# Patient Record
Sex: Male | Born: 1935 | Race: White | Hispanic: No | Marital: Married | Smoking: Never smoker
Health system: Southern US, Community
[De-identification: ages and names within clinical notes are randomized; demographics above are authoritative.]

## PROBLEM LIST (undated history)

## (undated) DIAGNOSIS — Z87442 Personal history of urinary calculi: Secondary | ICD-10-CM

## (undated) DIAGNOSIS — E119 Type 2 diabetes mellitus without complications: Secondary | ICD-10-CM

## (undated) DIAGNOSIS — I499 Cardiac arrhythmia, unspecified: Secondary | ICD-10-CM

## (undated) DIAGNOSIS — I10 Essential (primary) hypertension: Secondary | ICD-10-CM

## (undated) DIAGNOSIS — I5032 Chronic diastolic (congestive) heart failure: Secondary | ICD-10-CM

## (undated) DIAGNOSIS — I509 Heart failure, unspecified: Secondary | ICD-10-CM

## (undated) DIAGNOSIS — I482 Chronic atrial fibrillation, unspecified: Secondary | ICD-10-CM

## (undated) DIAGNOSIS — R0600 Dyspnea, unspecified: Secondary | ICD-10-CM

## (undated) DIAGNOSIS — E785 Hyperlipidemia, unspecified: Secondary | ICD-10-CM

## (undated) DIAGNOSIS — M109 Gout, unspecified: Secondary | ICD-10-CM

## (undated) HISTORY — PX: CATARACT EXTRACTION: SUR2

## (undated) HISTORY — PX: OTHER SURGICAL HISTORY: SHX169

## (undated) HISTORY — DX: Heart failure, unspecified: I50.9

## (undated) HISTORY — PX: KNEE SURGERY: SHX244

## (undated) HISTORY — PX: REPLACEMENT TOTAL KNEE BILATERAL: SUR1225

---

## 2021-07-20 ENCOUNTER — Emergency Department: Payer: Medicare Other

## 2021-07-20 ENCOUNTER — Other Ambulatory Visit: Payer: Self-pay

## 2021-07-20 ENCOUNTER — Inpatient Hospital Stay
Admission: EM | Admit: 2021-07-20 | Discharge: 2021-07-24 | DRG: 291 | Disposition: A | Discharge status: Home / Self Care | Payer: Medicare Other | Attending: Internal Medicine | Admitting: Internal Medicine

## 2021-07-20 ENCOUNTER — Encounter: Payer: Self-pay | Admitting: Internal Medicine

## 2021-07-20 DIAGNOSIS — I10 Essential (primary) hypertension: Secondary | ICD-10-CM

## 2021-07-20 DIAGNOSIS — I482 Chronic atrial fibrillation, unspecified: Secondary | ICD-10-CM | POA: Diagnosis not present

## 2021-07-20 DIAGNOSIS — M109 Gout, unspecified: Secondary | ICD-10-CM | POA: Insufficient documentation

## 2021-07-20 DIAGNOSIS — I110 Hypertensive heart disease with heart failure: Principal | ICD-10-CM | POA: Diagnosis present

## 2021-07-20 DIAGNOSIS — Z7984 Long term (current) use of oral hypoglycemic drugs: Secondary | ICD-10-CM | POA: Diagnosis not present

## 2021-07-20 DIAGNOSIS — I4821 Permanent atrial fibrillation: Secondary | ICD-10-CM | POA: Diagnosis present

## 2021-07-20 DIAGNOSIS — I5033 Acute on chronic diastolic (congestive) heart failure: Secondary | ICD-10-CM | POA: Diagnosis present

## 2021-07-20 DIAGNOSIS — I5031 Acute diastolic (congestive) heart failure: Secondary | ICD-10-CM | POA: Diagnosis not present

## 2021-07-20 DIAGNOSIS — Z79899 Other long term (current) drug therapy: Secondary | ICD-10-CM | POA: Diagnosis not present

## 2021-07-20 DIAGNOSIS — R7989 Other specified abnormal findings of blood chemistry: Secondary | ICD-10-CM | POA: Diagnosis present

## 2021-07-20 DIAGNOSIS — J189 Pneumonia, unspecified organism: Secondary | ICD-10-CM

## 2021-07-20 DIAGNOSIS — R0602 Shortness of breath: Secondary | ICD-10-CM | POA: Diagnosis present

## 2021-07-20 DIAGNOSIS — I350 Nonrheumatic aortic (valve) stenosis: Secondary | ICD-10-CM | POA: Diagnosis present

## 2021-07-20 DIAGNOSIS — Z794 Long term (current) use of insulin: Secondary | ICD-10-CM

## 2021-07-20 DIAGNOSIS — Z66 Do not resuscitate: Secondary | ICD-10-CM | POA: Diagnosis present

## 2021-07-20 DIAGNOSIS — M7989 Other specified soft tissue disorders: Secondary | ICD-10-CM | POA: Diagnosis present

## 2021-07-20 DIAGNOSIS — D649 Anemia, unspecified: Secondary | ICD-10-CM | POA: Diagnosis present

## 2021-07-20 DIAGNOSIS — I959 Hypotension, unspecified: Secondary | ICD-10-CM | POA: Diagnosis not present

## 2021-07-20 DIAGNOSIS — Z87891 Personal history of nicotine dependence: Secondary | ICD-10-CM | POA: Diagnosis not present

## 2021-07-20 DIAGNOSIS — R2689 Other abnormalities of gait and mobility: Secondary | ICD-10-CM | POA: Diagnosis present

## 2021-07-20 DIAGNOSIS — E8809 Other disorders of plasma-protein metabolism, not elsewhere classified: Secondary | ICD-10-CM | POA: Diagnosis present

## 2021-07-20 DIAGNOSIS — E785 Hyperlipidemia, unspecified: Secondary | ICD-10-CM | POA: Insufficient documentation

## 2021-07-20 DIAGNOSIS — Z7901 Long term (current) use of anticoagulants: Secondary | ICD-10-CM | POA: Diagnosis not present

## 2021-07-20 DIAGNOSIS — Z20822 Contact with and (suspected) exposure to covid-19: Secondary | ICD-10-CM | POA: Diagnosis present

## 2021-07-20 DIAGNOSIS — Z6832 Body mass index (BMI) 32.0-32.9, adult: Secondary | ICD-10-CM | POA: Diagnosis not present

## 2021-07-20 DIAGNOSIS — E872 Acidosis: Secondary | ICD-10-CM | POA: Diagnosis present

## 2021-07-20 DIAGNOSIS — N390 Urinary tract infection, site not specified: Secondary | ICD-10-CM | POA: Diagnosis present

## 2021-07-20 DIAGNOSIS — I060 Rheumatic aortic stenosis: Secondary | ICD-10-CM | POA: Diagnosis not present

## 2021-07-20 DIAGNOSIS — E669 Obesity, unspecified: Secondary | ICD-10-CM | POA: Diagnosis present

## 2021-07-20 DIAGNOSIS — E119 Type 2 diabetes mellitus without complications: Secondary | ICD-10-CM | POA: Diagnosis present

## 2021-07-20 DIAGNOSIS — E876 Hypokalemia: Secondary | ICD-10-CM | POA: Diagnosis present

## 2021-07-20 HISTORY — DX: Chronic atrial fibrillation, unspecified: I48.20

## 2021-07-20 HISTORY — DX: Gout, unspecified: M10.9

## 2021-07-20 HISTORY — DX: Hyperlipidemia, unspecified: E78.5

## 2021-07-20 HISTORY — DX: Type 2 diabetes mellitus without complications: E11.9

## 2021-07-20 HISTORY — DX: Essential (primary) hypertension: I10

## 2021-07-20 LAB — CBC WITH DIFFERENTIAL/PLATELET
Abs Immature Granulocytes: 0.03 10*3/uL (ref 0.00–0.07)
Basophils Absolute: 0 10*3/uL (ref 0.0–0.1)
Basophils Relative: 0 %
Eosinophils Absolute: 0.3 10*3/uL (ref 0.0–0.5)
Eosinophils Relative: 4 %
HCT: 34.4 % — ABNORMAL LOW (ref 39.0–52.0)
Hemoglobin: 11.3 g/dL — ABNORMAL LOW (ref 13.0–17.0)
Immature Granulocytes: 0 %
Lymphocytes Relative: 19 %
Lymphs Abs: 1.6 10*3/uL (ref 0.7–4.0)
MCH: 31 pg (ref 26.0–34.0)
MCHC: 32.8 g/dL (ref 30.0–36.0)
MCV: 94.2 fL (ref 80.0–100.0)
Monocytes Absolute: 0.6 10*3/uL (ref 0.1–1.0)
Monocytes Relative: 7 %
Neutro Abs: 5.8 10*3/uL (ref 1.7–7.7)
Neutrophils Relative %: 70 %
Platelets: 262 10*3/uL (ref 150–400)
RBC: 3.65 MIL/uL — ABNORMAL LOW (ref 4.22–5.81)
RDW: 15.2 % (ref 11.5–15.5)
WBC: 8.3 10*3/uL (ref 4.0–10.5)
nRBC: 0 % (ref 0.0–0.2)

## 2021-07-20 LAB — LACTIC ACID, PLASMA
Lactic Acid, Venous: 3.1 mmol/L (ref 0.5–1.9)
Lactic Acid, Venous: 1.6 mmol/L (ref 0.5–1.9)

## 2021-07-20 LAB — URINALYSIS, COMPLETE (UACMP) WITH MICROSCOPIC
Bilirubin Urine: NEGATIVE
Glucose, UA: NEGATIVE mg/dL
Hgb urine dipstick: NEGATIVE
Ketones, ur: NEGATIVE mg/dL
Nitrite: POSITIVE — AB
Protein, ur: 30 mg/dL — AB
Specific Gravity, Urine: 1.018 (ref 1.005–1.030)
WBC, UA: >50 WBC/hpf — ABNORMAL HIGH (ref 0–5)
pH: 6 (ref 5.0–8.0)

## 2021-07-20 LAB — COMPREHENSIVE METABOLIC PANEL
ALT: 7 U/L (ref 0–44)
AST: 17 U/L (ref 15–41)
Albumin: 3.4 g/dL — ABNORMAL LOW (ref 3.5–5.0)
Alkaline Phosphatase: 58 U/L (ref 38–126)
Anion gap: 12 (ref 5–15)
BUN: 16 mg/dL (ref 8–23)
CO2: 31 mmol/L (ref 22–32)
Calcium: 8.6 mg/dL — ABNORMAL LOW (ref 8.9–10.3)
Chloride: 96 mmol/L — ABNORMAL LOW (ref 98–111)
Creatinine, Ser: 0.97 mg/dL (ref 0.61–1.24)
GFR, Estimated: >60 mL/min (ref >60)
Glucose, Bld: 129 mg/dL — ABNORMAL HIGH (ref 70–99)
Potassium: 4 mmol/L (ref 3.5–5.1)
Sodium: 139 mmol/L (ref 135–145)
Total Bilirubin: 0.8 mg/dL (ref 0.3–1.2)
Total Protein: 6.8 g/dL (ref 6.5–8.1)

## 2021-07-20 LAB — BRAIN NATRIURETIC PEPTIDE: B Natriuretic Peptide: 202.9 pg/mL — ABNORMAL HIGH (ref 0.0–100.0)

## 2021-07-20 LAB — HEMOGLOBIN A1C
Hgb A1c MFr Bld: 6.1 % — ABNORMAL HIGH (ref 4.8–5.6)
Mean Plasma Glucose: 128 mg/dL

## 2021-07-20 LAB — TROPONIN I (HIGH SENSITIVITY)
Troponin I (High Sensitivity): 15 ng/L (ref <18)
Troponin I (High Sensitivity): 13 ng/L (ref <18)
Troponin I (High Sensitivity): 13 ng/L (ref <18)

## 2021-07-20 LAB — RESP PANEL BY RT-PCR (FLU A&B, COVID) ARPGX2
Influenza A by PCR: NEGATIVE
Influenza B by PCR: NEGATIVE
SARS Coronavirus 2 by RT PCR: NEGATIVE

## 2021-07-20 LAB — PROCALCITONIN: Procalcitonin: <0.1 ng/mL

## 2021-07-20 LAB — CBG MONITORING, ED
Glucose-Capillary: 106 mg/dL — ABNORMAL HIGH (ref 70–99)
Glucose-Capillary: 124 mg/dL — ABNORMAL HIGH (ref 70–99)

## 2021-07-20 LAB — STREP PNEUMONIAE URINARY ANTIGEN: Strep Pneumo Urinary Antigen: NEGATIVE

## 2021-07-20 MED ORDER — CELECOXIB 200 MG PO CAPS
200.0000 mg | ORAL_CAPSULE | Freq: Every day | ORAL | Status: DC
  Administered 2021-07-21 – 2021-07-24 (×4): 200 mg via ORAL
  Filled 2021-07-20 (×4): qty 1

## 2021-07-20 MED ORDER — ALBUTEROL SULFATE (2.5 MG/3ML) 0.083% IN NEBU
2.5000 mg | INHALATION_SOLUTION | RESPIRATORY_TRACT | Status: DC | PRN
  Administered 2021-07-20: 2.5 mg via RESPIRATORY_TRACT
  Filled 2021-07-20: qty 3

## 2021-07-20 MED ORDER — METOPROLOL SUCCINATE ER 50 MG PO TB24
50.0000 mg | ORAL_TABLET | Freq: Every day | ORAL | Status: DC
  Administered 2021-07-21: 50 mg via ORAL
  Filled 2021-07-20: qty 1

## 2021-07-20 MED ORDER — ALLOPURINOL 300 MG PO TABS
300.0000 mg | ORAL_TABLET | Freq: Every day | ORAL | Status: DC
  Administered 2021-07-21 – 2021-07-24 (×4): 300 mg via ORAL
  Filled 2021-07-20 (×4): qty 1

## 2021-07-20 MED ORDER — ALBUTEROL SULFATE HFA 108 (90 BASE) MCG/ACT IN AERS
2.0000 | INHALATION_SPRAY | RESPIRATORY_TRACT | Status: DC | PRN
Start: 2021-07-20 — End: 2021-07-20

## 2021-07-20 MED ORDER — SODIUM CHLORIDE 0.9 % IV SOLN
2.0000 g | INTRAVENOUS | Status: DC
  Administered 2021-07-20 – 2021-07-21 (×2): 2 g via INTRAVENOUS
  Filled 2021-07-20 (×2): qty 20

## 2021-07-20 MED ORDER — INSULIN ASPART 100 UNIT/ML IJ SOLN
0.0000 [IU] | Freq: Every day | INTRAMUSCULAR | Status: DC
Start: 2021-07-20 — End: 2021-07-24

## 2021-07-20 MED ORDER — SODIUM CHLORIDE 0.9 % IV SOLN
500.0000 mg | INTRAVENOUS | Status: DC
  Administered 2021-07-20: 500 mg via INTRAVENOUS
  Filled 2021-07-20: qty 500

## 2021-07-20 MED ORDER — INSULIN ASPART 100 UNIT/ML IJ SOLN
0.0000 [IU] | Freq: Three times a day (TID) | INTRAMUSCULAR | Status: DC
  Administered 2021-07-21: 1 [IU] via SUBCUTANEOUS
  Administered 2021-07-21: 2 [IU] via SUBCUTANEOUS
  Administered 2021-07-22: 5 [IU] via SUBCUTANEOUS
  Administered 2021-07-22: 2 [IU] via SUBCUTANEOUS
  Administered 2021-07-23 – 2021-07-24 (×3): 1 [IU] via SUBCUTANEOUS
  Filled 2021-07-20 (×7): qty 1

## 2021-07-20 MED ORDER — FUROSEMIDE 10 MG/ML IJ SOLN
20.0000 mg | Freq: Two times a day (BID) | INTRAMUSCULAR | Status: DC
Start: 2021-07-20 — End: 2021-07-23
  Administered 2021-07-20 – 2021-07-23 (×6): 20 mg via INTRAVENOUS
  Filled 2021-07-20: qty 4
  Filled 2021-07-20 (×4): qty 2
  Filled 2021-07-20: qty 4

## 2021-07-20 MED ORDER — RIVAROXABAN 20 MG PO TABS
20.0000 mg | ORAL_TABLET | Freq: Every day | ORAL | Status: DC
  Administered 2021-07-21 – 2021-07-24 (×4): 20 mg via ORAL
  Filled 2021-07-20 (×5): qty 1

## 2021-07-20 MED ORDER — SIMVASTATIN 20 MG PO TABS
20.0000 mg | ORAL_TABLET | Freq: Every day | ORAL | Status: DC
  Administered 2021-07-20 – 2021-07-23 (×4): 20 mg via ORAL
  Filled 2021-07-20: qty 2
  Filled 2021-07-20 (×3): qty 1

## 2021-07-20 MED ORDER — HYDRALAZINE HCL 20 MG/ML IJ SOLN: 5.0000 mg | INTRAMUSCULAR | Status: DC | PRN

## 2021-07-20 MED ORDER — TERAZOSIN HCL 5 MG PO CAPS
10.0000 mg | ORAL_CAPSULE | Freq: Every day | ORAL | Status: DC
  Administered 2021-07-20 – 2021-07-21 (×2): 10 mg via ORAL
  Filled 2021-07-20 (×4): qty 2

## 2021-07-20 MED ORDER — LISINOPRIL 20 MG PO TABS
20.0000 mg | ORAL_TABLET | Freq: Every day | ORAL | Status: DC
  Administered 2021-07-21: 20 mg via ORAL
  Filled 2021-07-20: qty 2

## 2021-07-20 MED ORDER — INSULIN GLARGINE-YFGN 100 UNIT/ML ~~LOC~~ SOLN
10.0000 [IU] | Freq: Two times a day (BID) | SUBCUTANEOUS | Status: DC
  Administered 2021-07-20 – 2021-07-24 (×7): 10 [IU] via SUBCUTANEOUS
  Filled 2021-07-20 (×9): qty 0.1

## 2021-07-20 MED ORDER — ONDANSETRON HCL 4 MG/2ML IJ SOLN: 4.0000 mg | Freq: Three times a day (TID) | INTRAMUSCULAR | Status: DC | PRN

## 2021-07-20 MED ORDER — ACETAMINOPHEN 160 MG/5ML PO SOLN
650.0000 mg | Freq: Four times a day (QID) | ORAL | Status: DC | PRN
  Filled 2021-07-20: qty 20.3

## 2021-07-20 MED ORDER — DM-GUAIFENESIN ER 30-600 MG PO TB12: 1.0000 | ORAL_TABLET | Freq: Two times a day (BID) | ORAL | Status: DC | PRN

## 2021-07-20 NOTE — Progress Notes (Signed)
CODE SEPSIS - PHARMACY COMMUNICATION  **Broad Spectrum Antibiotics should be administered within 1 hour of Sepsis diagnosis**  Time Code Sepsis Called/Page Received: 1222  Antibiotics Ordered:  Azithromycin '500mg'$  IVPB Ceftriaxone 2g IVPB  Time of 1st antibiotic administration: 1258   Johnnye Sandford Rodriguez-Guzman PharmD 07/20/2021 1:31 PM

## 2021-07-20 NOTE — ED Provider Notes (Signed)
[facility]  ____________________________________________   Event Date/Time   First MD Initiated Contact with Patient 07/20/21 1022     (approximate)  I have reviewed the triage vital signs and the nursing notes.   HISTORY  Chief Complaint Shortness of Breath    HPI William Sawyer is a 87 y.o. male atrial fibrillation on Xarelto, hypertension, hyperlipidemia, diabetes with shortness of breath.  Patient notes that over the past several days he has been having dyspnea especially at night.  Today he had an episode that lasted about 45 minutes.  He also was having dyspnea on exertion.  He has chronic lower extremity swelling which is unchanged from prior.  He is able to sleep flat.  Denies cough, chest pain, fever or chills.  No abdominal pain, nausea vomiting or diarrhea.         No past medical history on file.  There are no problems to display for this patient.    Prior to Admission medications   Medication Sig Start Date End Date Taking? Authorizing Provider  allopurinol (ZYLOPRIM) 300 MG tablet Take 300 mg by mouth daily. 05/15/21  Yes [provider]  celecoxib (CELEBREX) 200 MG capsule Take 200 mg by mouth daily. 05/15/21  Yes [provider]  furosemide (LASIX) 20 MG tablet Take 20 mg by mouth at bedtime. 05/15/21  Yes [provider]  LANTUS SOLOSTAR 100 UNIT/ML Solostar Pen Inject 15 Units into the skin in the morning and at bedtime. 07/06/21  Yes [provider]  lisinopril (ZESTRIL) 20 MG tablet Take 20 mg by mouth daily. 06/05/21  Yes [provider]  metFORMIN (GLUCOPHAGE) 1000 MG tablet Take 1,000 mg by mouth 2 (two) times daily. 05/16/21  Yes [provider]  metoprolol succinate (TOPROL-XL) 50 MG 24 hr tablet Take 50 mg by mouth daily. 05/15/21  Yes [provider]  simvastatin (ZOCOR) 20 MG tablet Take 20 mg by mouth at bedtime. 06/04/21  Yes [provider]  terazosin  (HYTRIN) 10 MG capsule Take 10 mg by mouth at bedtime. 06/15/21  Yes [provider]  XARELTO 20 MG TABS tablet Take 20 mg by mouth daily. 06/04/21  Yes [provider]    Allergies Patient has no allergy information on record.  No family history on file.  Social History    Review of Systems  Constitutional: No fever/chills Eyes: No visual changes. ENT: No sore throat. Cardiovascular: Denies chest pain. Respiratory: + Shortness of breath Gastrointestinal: No abdominal pain.  No nausea, no vomiting.  No diarrhea.  No constipation. Genitourinary: Negative for dysuria. Musculoskeletal: Negative for back pain. Skin: Negative for rash. Neurological: Negative for headaches, focal weakness or numbness.   ____________________________________________   PHYSICAL EXAM:  VITAL SIGNS: ED Triage Vitals  Enc Vitals Group     BP 07/20/21 0936 102/62     Pulse Rate 07/20/21 0936 97     Resp 07/20/21 0936 18     Temp 07/20/21 0936 97.7 F (36.5 C)     Temp Source 07/20/21 0936 Oral     SpO2 07/20/21 0936 98 %     Weight 07/20/21 0945 260 lb (117.9 kg)     Height 07/20/21 0945 '6\' 1"'$  (1.854 m)     Head Circumference --      Peak Flow --      Pain Score 07/20/21 0945 4     Pain Loc --      Pain Edu? --  Excl. in [place]? --     Constitutional: Alert and oriented. Well appearing and in no acute distress. Eyes: Conjunctivae are normal.  Head: Atraumatic. Nose: No congestion/rhinnorhea. Mouth/Throat: Mucous membranes are moist.  Oropharynx non-erythematous. Neck: No stridor.   Cardiovascular: Regular rhythm, mildly tachycardic.  Grossly normal heart sounds.  Good peripheral circulation. Respiratory: Normal respiratory effort.  No retractions. Lungs CTAB. Gastrointestinal: Soft and nontender. No distention.  No CVA tenderness. Genitourinary: deferred Musculoskeletal: 2+ lower extremity edema, symmetric Neurologic:  Normal speech and language. No gross focal  neurologic deficits are appreciated. No gait instability. Skin:  Skin is warm, dry and intact. No rash noted. Psychiatric: Mood and affect are normal. Speech and behavior are normal.  ____________________________________________   LABS (all labs ordered are listed, but only abnormal results are displayed)  Labs Reviewed  CBC WITH DIFFERENTIAL/PLATELET - Abnormal; Notable for the following components:      Result Value   RBC 3.65 (*)    Hemoglobin 11.3 (*)    HCT 34.4 (*)    All other components within normal limits  BRAIN NATRIURETIC PEPTIDE - Abnormal; Notable for the following components:   B Natriuretic Peptide 202.9 (*)    All other components within normal limits  COMPREHENSIVE METABOLIC PANEL - Abnormal; Notable for the following components:   Chloride 96 (*)    Glucose, Bld 129 (*)    Calcium 8.6 (*)    Albumin 3.4 (*)    All other components within normal limits  LACTIC ACID, PLASMA - Abnormal; Notable for the following components:   Lactic Acid, Venous 3.1 (*)    All other components within normal limits  RESP PANEL BY RT-PCR (FLU A&B, COVID) [id number]  CULTURE, BLOOD (ROUTINE X 2)  CULTURE, BLOOD (ROUTINE X 2)  LACTIC ACID, PLASMA  TROPONIN I (HIGH SENSITIVITY)   ____________________________________________  EKG  Atrial fibrillation, no acute ischemic changes ____________________________________________  RADIOLOGY Almeta Monas, personally viewed and evaluated these images (plain radiographs) as part of my medical decision making, as well as reviewing the written report by the radiologist.  ED MD interpretation: Per radiology read, right lower lobe infiltrate with effusion    ____________________________________________   PROCEDURES  Procedure(s) performed (including Critical Care):  Procedures   ____________________________________________   INITIAL IMPRESSION / ASSESSMENT AND PLAN / ED COURSE  87 year old male presents for shortness of  breath.  Vital signs notable for mild tachycardia.  His chest x-ray is concerning for right lower lobe pneumonia.  He has a lactate of 3.1 and an elevated heart rate but otherwise no signs or symptoms concerning for sepsis.  Patient given ceftriaxone a azithromycin for CAP coverage.  Will admit for further management.  Clinical Course as of 07/20/21 1224  Mon Jul 20, 2021  1216 Lactic Acid, Venous(!!): 3.1 [KM]    Clinical Course User Index [KM] Rada Hay, MD     ____________________________________________   FINAL CLINICAL IMPRESSION(S) / ED DIAGNOSES  Final diagnoses:  Community acquired pneumonia of right lung, unspecified part of lung     ED Discharge Orders     None        Note:  This document was prepared using Dragon voice recognition software and may include unintentional dictation errors.    Rada Hay, MD 07/20/21 1224

## 2021-07-20 NOTE — ED Triage Notes (Signed)
Pt here with SOB that started this morning. PT also states that he has a cough but no fever. PT denies N/V/D. Pt states that this morning he started to have pain in his chest and that made him come to the ED.

## 2021-07-20 NOTE — H&P (Addendum)
We willFamily History and Physical    William Sawyer [id number] DOB: 19-Mar-1933 DOA: 07/20/2021  Referring MD/NP/PA:   PCP: [name], MD   Patient coming from:  The patient is coming from home.  At baseline, pt is independent for most of ADL.        Chief Complaint: SOB  HPI: William Sawyer is a 87 y.o. 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  Patient states that he has been having shortness of breath for more than 3 days, which has been worsening progressively.  Patient has dry cough, denies chest pain, fever or chills.  Patient states that his shortness breath is worse in the night.  He states that he has chronic bilateral leg edema since childhood and that he is taking Lasix chronically.  He denies history of CHF.  No nausea, vomiting, diarrhea or abdominal pain.  No symptoms of UTI.   ED Course: pt was found to have WBC 8.3, BNP two 2.9, troponin level 15, 13, lactic acid 3.1, 1.6, negative COVID PCR, positive urinalysis (cloudy appearance, large amount of leukocyte, positive nitrite, many bacteria, WBC> 50), electrolytes renal function okay, temperature normal, blood pressure 102/62, heart rate 97, RR 18, oxygen saturation 98% on room air.  Chest x-ray showed right lower lobe infiltration with a small pleural effusion.  Patient is admitted to progressive bed as inpatient.   Review of Systems:   General: no fevers, chills, no body weight gain, has fatigue HEENT: no blurry vision, hearing changes or sore throat Respiratory: has dyspnea, coughing, no wheezing CV: no chest pain, no palpitations GI: no nausea, vomiting, abdominal pain, diarrhea, constipation GU: no dysuria, burning on urination, increased urinary frequency, hematuria  Ext: has leg edema Neuro: no unilateral weakness, numbness, or tingling, no vision change or hearing  loss Skin: no rash, no skin tear. MSK: No muscle spasm, no deformity, no limitation of range of movement in spin Heme: No easy bruising.  Travel history: No recent long distant travel.  Allergy: No Known Allergies  Past Medical History:  Diagnosis Date   Atrial fibrillation, chronic (HCC)    Diabetes mellitus without complication ([place])    Gout    HLD (hyperlipidemia)    HTN (hypertension)     Past Surgical History:  Procedure Laterality Date   Left hip replacement     REPLACEMENT TOTAL KNEE BILATERAL Bilateral     Social History:  reports that he has never smoked. He has never used smokeless tobacco. He reports previous alcohol use. He reports that he does not use drugs.  Family History:  Family History  Problem Relation Age of Onset   Breast cancer Mother    Pancreatic cancer Father      Prior to Admission medications   Medication Sig Start Date End Date Taking? Authorizing Provider  allopurinol (ZYLOPRIM) 300 MG tablet Take 300 mg by mouth daily. 05/15/21  Yes [provider]  celecoxib (CELEBREX) 200 MG capsule Take 200 mg by mouth daily. 05/15/21  Yes [provider]  furosemide (LASIX) 20 MG tablet Take 20 mg by mouth at bedtime. 05/15/21  Yes [provider]  LANTUS SOLOSTAR 100 UNIT/ML Solostar Pen Inject 15 Units into the skin in the morning and at bedtime. 07/06/21  Yes [provider]  lisinopril (ZESTRIL) 20 MG tablet Take 20 mg by mouth daily. 06/05/21  Yes [provider]  metFORMIN (  GLUCOPHAGE) 1000 MG tablet Take 1,000 mg by mouth 2 (two) times daily. 05/16/21  Yes [provider]  metoprolol succinate (TOPROL-XL) 50 MG 24 hr tablet Take 50 mg by mouth daily. 05/15/21  Yes [provider]  simvastatin (ZOCOR) 20 MG tablet Take 20 mg by mouth at bedtime. 06/04/21  Yes [provider]  terazosin (HYTRIN) 10 MG capsule Take 10 mg by mouth at bedtime. 06/15/21  Yes [provider]  XARELTO 20  MG TABS tablet Take 20 mg by mouth daily. 06/04/21  Yes [provider]    Physical Exam: Vitals:   07/20/21 1400 07/20/21 1430 07/20/21 1500 07/20/21 1530  BP: 112/69 108/74 118/81 113/90  Pulse: 82 66 (!) 103 85  Resp: '18 16 18 '$ (!) 24  Temp:      TempSrc:      SpO2: 99% 97% 100% 94%  Weight:      Height:       General: Not in acute distress HEENT:       Eyes: PERRL, EOMI, no scleral icterus.       ENT: No discharge from the ears and nose, no pharynx injection, no tonsillar enlargement.        Neck: No JVD, no bruit, no mass felt. Heme: No neck lymph node enlargement. Cardiac: S1/S2, RRR, has 2/6 systolic murmurs, No gallops or rubs. Respiratory: No rales, wheezing, rhonchi or rubs. GI: Soft, nondistended, nontender, no rebound pain, no organomegaly, BS present. GU: No hematuria Ext: 2+ pitting leg edema bilaterally. 1+DP/PT pulse bilaterally. Musculoskeletal: No joint deformities, No joint redness or warmth, no limitation of ROM in spin. Skin: No rashes.  Neuro: Alert, oriented X3, cranial nerves II-XII grossly intact, moves all extremities normally.  Psych: Patient is not psychotic, no suicidal or hemocidal ideation.  Labs on Admission: I have personally reviewed following labs and imaging studies  CBC: Recent Labs  Lab 07/20/21 0948  WBC 8.3  NEUTROABS 5.8  HGB 11.3*  HCT 34.4*  MCV 94.2  PLT 99991111   Basic Metabolic Panel: Recent Labs  Lab 07/20/21 0948  NA 139  K 4.0  CL 96*  CO2 31  GLUCOSE 129*  BUN 16  CREATININE 0.97  CALCIUM 8.6*   GFR: Estimated Creatinine Clearance: 74.9 mL/min (by C-G formula based on SCr of 0.97 mg/dL). Liver Function Tests: Recent Labs  Lab 07/20/21 0948  AST 17  ALT 7  ALKPHOS 58  BILITOT 0.8  PROT 6.8  ALBUMIN 3.4*   No results for input(s): LIPASE, AMYLASE in the last 168 hours. No results for input(s): AMMONIA in the last 168 hours. Coagulation Profile: No results for input(s): INR, PROTIME in the last  168 hours. Cardiac Enzymes: No results for input(s): CKTOTAL, CKMB, CKMBINDEX, TROPONINI in the last 168 hours. BNP (last 3 results) No results for input(s): PROBNP in the last 8760 hours. HbA1C: No results for input(s): HGBA1C in the last 72 hours. CBG: No results for input(s): GLUCAP in the last 168 hours. Lipid Profile: No results for input(s): CHOL, HDL, LDLCALC, TRIG, CHOLHDL, LDLDIRECT in the last 72 hours. Thyroid Function Tests: No results for input(s): TSH, T4TOTAL, FREET4, T3FREE, THYROIDAB in the last 72 hours. Anemia Panel: No results for input(s): VITAMINB12, FOLATE, FERRITIN, TIBC, IRON, RETICCTPCT in the last 72 hours. Urine analysis:    Component Value Date/Time   COLORURINE AMBER (A) 07/20/2021 1326   APPEARANCEUR CLOUDY (A) 07/20/2021 1326   LABSPEC 1.018 07/20/2021 1326   PHURINE 6.0 07/20/2021 1326  GLUCOSEU NEGATIVE 07/20/2021 1326   HGBUR NEGATIVE 07/20/2021 1326   [place] 07/20/2021 1326   [place] 07/20/2021 1326   PROTEINUR 30 (A) 07/20/2021 1326   NITRITE POSITIVE (A) 07/20/2021 1326   LEUKOCYTESUR LARGE (A) 07/20/2021 1326   Sepsis Labs: '@LABRCNTIP'$ (procalcitonin:4,lacticidven:4) ) Recent Results (from the past 240 hour(s))  Resp Panel by RT-PCR (Flu A&B, Covid) Nasopharyngeal Swab     Status: None   Collection Time: 07/20/21 11:10 AM   Specimen: Nasopharyngeal Swab; Nasopharyngeal(NP) swabs in vial transport medium  Result Value Ref Range Status   SARS Coronavirus 2 by RT PCR NEGATIVE NEGATIVE Final    Comment: (NOTE) SARS-CoV-2 target nucleic acids are NOT DETECTED.  The SARS-CoV-2 RNA is generally detectable in upper respiratory specimens during the acute phase of infection. The lowest concentration of SARS-CoV-2 viral copies this assay can detect is 138 copies/mL. A negative result does not preclude SARS-Cov-2 infection and should not be used as the sole basis for treatment or other patient management decisions. A  negative result may occur with  improper specimen collection/handling, submission of specimen other than nasopharyngeal swab, presence of viral mutation(s) within the areas targeted by this assay, and inadequate number of viral copies(<138 copies/mL). A negative result must be combined with clinical observations, patient history, and epidemiological information. The expected result is Negative.  Fact Sheet for Patients:  EntrepreneurPulse.com.au  Fact Sheet for Healthcare Providers:  IncredibleEmployment.be  This test is no t yet approved or cleared by the [place] FDA and  has been authorized for detection and/or diagnosis of SARS-CoV-2 by FDA under an Emergency Use Authorization (EUA). This EUA will remain  in effect (meaning this test can be used) for the duration of the COVID-19 declaration under Section 564(b)(1) of the Act, 21 U.S.C.section 360bbb-3(b)(1), unless the authorization is terminated  or revoked sooner.       Influenza A by PCR NEGATIVE NEGATIVE Final   Influenza B by PCR NEGATIVE NEGATIVE Final    Comment: (NOTE) The Xpert Xpress SARS-CoV-2/FLU/RSV plus assay is intended as an aid in the diagnosis of influenza from Nasopharyngeal swab specimens and should not be used as a sole basis for treatment. Nasal washings and aspirates are unacceptable for Xpert Xpress SARS-CoV-2/FLU/RSV testing.  Fact Sheet for Patients: EntrepreneurPulse.com.au  Fact Sheet for Healthcare Providers: IncredibleEmployment.be  This test is not yet approved or cleared by the [place] FDA and has been authorized for detection and/or diagnosis of SARS-CoV-2 by FDA under an Emergency Use Authorization (EUA). This EUA will remain in effect (meaning this test can be used) for the duration of the COVID-19 declaration under Section 564(b)(1) of the Act, 21 U.S.C. section 360bbb-3(b)(1), unless the authorization  is terminated or revoked.  Performed at [facility], [address] [place], [place] [address]      Radiological Exams on Admission: DG Chest 2 View  Result Date: 07/20/2021 CLINICAL DATA:  Shortness of breath EXAM: CHEST - 2 VIEW COMPARISON:  None. FINDINGS: Heart is normal size. Airspace disease in the right lower lobe with small right pleural effusion. Left lung clear. No acute bony abnormality. IMPRESSION: Right lower lobe airspace disease concerning for pneumonia. Small right effusion. Electronically Signed   By: Rolm Baptise M.D.   On: 07/20/2021 10:13     EKG: I have personally reviewed.  Atrial fibrillation, QTC 462, nonspecific T wave change  Assessment/Plan Principal Problem:   SOB (shortness of breath) Active Problems:   HTN (hypertension)   HLD (hyperlipidemia)  Gout   Atrial fibrillation, chronic (HCC)   Diabetes mellitus without complication (HCC)   Elevated lactic acid level   UTI (urinary tract infection)   SOB (shortness of breath): Etiology is not clear.  No oxygen desaturation.  Chest x-ray showed no infiltration, but patient does not have fever or leukocytosis.  Procalcitonin <0.10.  Clinically does not seem to have pneumonia.  Rocephin and azithromycin were started in ED, will discontinue azithromycin.  Rocephin will be continued since patient has UTI.  Patient has 2+ bilateral leg edema and elevated BNP 202, indicating possible CHF, possibly diastolic CHF given long history of hypertension. No 2D echo on record.  Patient is on Xarelto, low suspicions for PE  -Will admit to progressive unit as inpatient -Lasix 20 mg bid by IV -2d echo -Daily weights -strict I/O's -Low salt diet -Fluid restriction -Obtain REDs Vest reading  HTN (hypertension) -Lisinopril, metoprolol -IV hydralazine as needed  HLD (hyperlipidemia) -Zocor  Gout -Allopurinol  Atrial fibrillation, chronic (HCC) -Continue Xarelto and metoprolol  Diabetes mellitus without  complication (HCC) no [id number] on record.  Patient taking metformin and Lantus at home -Sliding scale insulin -Decrease Lantus dose from 15 to 10 unit twice daily -Check A1c  Elevated lactic acid level: Has resolved.  Lactic acid 3.1 --> 1.6  UTI (urinary tract infection) -IV Rocephin -Follow-up of blood culture and urine culture         DVT ppx: On Xarelto Code Status: DNR per his wife Family Communication: I called his wife Disposition Plan:  Anticipate discharge back to previous environment Consults called:  none Admission status and Level of care: Progressive Cardiac:  as inpt      Status is: Inpatient  Remains inpatient appropriate because:Inpatient level of care appropriate due to severity of illness  Dispo: The patient is from: Home              Anticipated d/c is to: Home              Patient currently is not medically stable to d/c.   Difficult to place patient No           Date of Service 07/20/2021    Ivor Costa [facility]   If 7PM-7AM, please contact night-coverage [email or web address] 07/20/2021, 4:40 PM

## 2021-07-20 NOTE — Sepsis Progress Note (Signed)
Sepsis protocol followed by eLink 

## 2021-07-21 ENCOUNTER — Inpatient Hospital Stay (HOSPITAL_COMMUNITY)
Admit: 2021-07-21 | Discharge: 2021-07-21 | Disposition: A | Discharge status: Home / Self Care | Payer: Medicare Other | Attending: Internal Medicine | Admitting: Internal Medicine

## 2021-07-21 DIAGNOSIS — I4821 Permanent atrial fibrillation: Secondary | ICD-10-CM | POA: Diagnosis not present

## 2021-07-21 DIAGNOSIS — I5031 Acute diastolic (congestive) heart failure: Secondary | ICD-10-CM

## 2021-07-21 DIAGNOSIS — I350 Nonrheumatic aortic (valve) stenosis: Secondary | ICD-10-CM

## 2021-07-21 DIAGNOSIS — R0602 Shortness of breath: Secondary | ICD-10-CM | POA: Diagnosis not present

## 2021-07-21 LAB — CBG MONITORING, ED
Glucose-Capillary: 66 mg/dL — ABNORMAL LOW (ref 70–99)
Glucose-Capillary: 102 mg/dL — ABNORMAL HIGH (ref 70–99)
Glucose-Capillary: 59 mg/dL — ABNORMAL LOW (ref 70–99)
Glucose-Capillary: 150 mg/dL — ABNORMAL HIGH (ref 70–99)

## 2021-07-21 LAB — CBC
HCT: 32.2 % — ABNORMAL LOW (ref 39.0–52.0)
Hemoglobin: 10.5 g/dL — ABNORMAL LOW (ref 13.0–17.0)
MCH: 31.1 pg (ref 26.0–34.0)
MCHC: 32.6 g/dL (ref 30.0–36.0)
MCV: 95.3 fL (ref 80.0–100.0)
Platelets: 236 10*3/uL (ref 150–400)
RBC: 3.38 MIL/uL — ABNORMAL LOW (ref 4.22–5.81)
RDW: 15.1 % (ref 11.5–15.5)
WBC: 7.1 10*3/uL (ref 4.0–10.5)
nRBC: 0 % (ref 0.0–0.2)

## 2021-07-21 LAB — ECHOCARDIOGRAM COMPLETE
AV Mean grad: 38 mmHg
AV Peak grad: 57.5 mmHg
Ao pk vel: 3.79 m/s
Area-P 1/2: 2.93 cm2
Calc EF: 57.4 %
Height: 73 in
S' Lateral: 1.56 cm
Single Plane A2C EF: 53.6 %
Single Plane A4C EF: 60.3 %
Weight: 4160 oz

## 2021-07-21 LAB — BASIC METABOLIC PANEL
Anion gap: 10 (ref 5–15)
BUN: 15 mg/dL (ref 8–23)
CO2: 32 mmol/L (ref 22–32)
Calcium: 8.6 mg/dL — ABNORMAL LOW (ref 8.9–10.3)
Chloride: 96 mmol/L — ABNORMAL LOW (ref 98–111)
Creatinine, Ser: 0.89 mg/dL (ref 0.61–1.24)
GFR, Estimated: >60 mL/min (ref >60)
Glucose, Bld: 72 mg/dL (ref 70–99)
Potassium: 3.6 mmol/L (ref 3.5–5.1)
Sodium: 138 mmol/L (ref 135–145)

## 2021-07-21 LAB — LIPID PANEL
Cholesterol: 103 mg/dL (ref 0–200)
HDL: 35 mg/dL — ABNORMAL LOW (ref >40)
LDL Cholesterol: 60 mg/dL (ref 0–99)
Total CHOL/HDL Ratio: 2.9 RATIO
Triglycerides: 38 mg/dL (ref <150)
VLDL: 8 mg/dL (ref 0–40)

## 2021-07-21 LAB — MAGNESIUM: Magnesium: 1.1 mg/dL — ABNORMAL LOW (ref 1.7–2.4)

## 2021-07-21 LAB — GLUCOSE, CAPILLARY
Glucose-Capillary: 159 mg/dL — ABNORMAL HIGH (ref 70–99)
Glucose-Capillary: 148 mg/dL — ABNORMAL HIGH (ref 70–99)

## 2021-07-21 MED ORDER — METOPROLOL SUCCINATE ER 50 MG PO TB24
50.0000 mg | ORAL_TABLET | Freq: Two times a day (BID) | ORAL | Status: DC
  Administered 2021-07-21 – 2021-07-22 (×2): 50 mg via ORAL
  Filled 2021-07-21 (×4): qty 1

## 2021-07-21 MED ORDER — PERFLUTREN LIPID MICROSPHERE
1.0000 mL | INTRAVENOUS | Status: AC | PRN
  Administered 2021-07-21: 4 mL via INTRAVENOUS
  Filled 2021-07-21: qty 10

## 2021-07-21 MED ORDER — CEPHALEXIN 500 MG PO CAPS
500.0000 mg | ORAL_CAPSULE | Freq: Three times a day (TID) | ORAL | Status: DC
  Administered 2021-07-22: 500 mg via ORAL
  Filled 2021-07-21: qty 1

## 2021-07-21 MED ORDER — MAGNESIUM SULFATE 4 GM/100ML IV SOLN
4.0000 g | Freq: Once | INTRAVENOUS | Status: AC
  Administered 2021-07-21: 4 g via INTRAVENOUS
  Filled 2021-07-21 (×2): qty 100

## 2021-07-21 MED ORDER — LISINOPRIL 10 MG PO TABS: 10.0000 mg | ORAL_TABLET | Freq: Every day | ORAL | Status: DC

## 2021-07-21 NOTE — ED Notes (Signed)
Informed RN bed assigned 

## 2021-07-21 NOTE — Progress Notes (Signed)
Patient son, William Sawyer is in room with patient at bedside. Patient son is taking home gold bracelet and wallet. Patient wanted to keep his wedding ring and bag at bedside along with hearing aides. He has no complaints of pain, bed in low position and call bell in reach.

## 2021-07-21 NOTE — Progress Notes (Signed)
William Sawyer at [place]: William Sawyer    MR#:  [id number]  DATE OF BIRTH:  [birth year]  SUBJECTIVE:   patient seen in the ER. Daughter at bedside. Came in after having increasing shortness of breath for last two months more so for last couple days. No diagnosis of CHF. Patient has chronic leg edema. He uses electric scooter in the house to get around. Does not ambulate much. Feels a lot better today. Good urine output of 3 L. REVIEW OF SYSTEMS:   Review of Systems  Constitutional:  Negative for chills, fever and weight loss.  HENT:  Negative for ear discharge, ear pain and nosebleeds.   Eyes:  Negative for blurred vision, pain and discharge.  Respiratory:  Positive for shortness of breath. Negative for sputum production, wheezing and stridor.   Cardiovascular:  Positive for leg swelling. Negative for chest pain, palpitations, orthopnea and PND.  Gastrointestinal:  Negative for abdominal pain, diarrhea, nausea and vomiting.  Genitourinary:  Negative for frequency and urgency.  Musculoskeletal:  Negative for back pain and joint pain.  Neurological:  Positive for weakness. Negative for sensory change, speech change and focal weakness.  Psychiatric/Behavioral:  Negative for depression and hallucinations. The patient is not nervous/anxious.   Tolerating Diet:yes Tolerating PT:   DRUG ALLERGIES:  No Known Allergies  VITALS:  Blood pressure 92/78, pulse 79, temperature 97.7 F (36.5 C), resp. rate 18, height '6\' 1"'$  (1.854 m), weight 117.9 kg, SpO2 100 %.  PHYSICAL EXAMINATION:   Physical Exam  GENERAL:  87 y.o.-year-old patient lying in the bed with no acute distress.  LUNGSdecreasedbreath sounds bilaterally, no wheezing, rales, rhonchi. No use of accessory muscles of respiration.  CARDIOVASCULAR: S1, S2 normal. No murmurs, rubs, or gallops.  ABDOMEN: Soft, nontender, nondistended. Bowel sounds present. No organomegaly or mass.   EXTREMITIES: chronic 2+ edema b/l.    NEUROLOGIC: Cranial nerves II through XII are intact. No focal Motor or sensory deficits b/l.   PSYCHIATRIC:  patient is alert and oriented x 3.  SKIN: No obvious rash, lesion, or ulcer.   LABORATORY PANEL:  CBC Recent Labs  Lab 07/21/21 0622  WBC 7.1  HGB 10.5*  HCT 32.2*  PLT 236    Chemistries  Recent Labs  Lab 07/20/21 0948 07/21/21 0622  NA 139 138  K 4.0 3.6  CL 96* 96*  CO2 31 32  GLUCOSE 129* 72  BUN 16 15  CREATININE 0.97 0.89  CALCIUM 8.6* 8.6*  MG  --  1.1*  AST 17  --   ALT 7  --   ALKPHOS 58  --   BILITOT 0.8  --    Cardiac Enzymes No results for input(s): TROPONINI in the last 168 hours. RADIOLOGY:  DG Chest 2 View  Result Date: 07/20/2021 CLINICAL DATA:  Shortness of breath EXAM: CHEST - 2 VIEW COMPARISON:  None. FINDINGS: Heart is normal size. Airspace disease in the right lower lobe with small right pleural effusion. Left lung clear. No acute bony abnormality. IMPRESSION: Right lower lobe airspace disease concerning for pneumonia. Small right effusion. Electronically Signed   By: Rolm Baptise M.D.   On: 07/20/2021 10:13   ASSESSMENT AND PLAN:  William Sawyer is a 87 y.o. 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   Patient states that he has been having shortness of breath for more  than 3 days, which has been worsening progressively.  Patient has dry cough, denies chest pain, fever or chills.  SOB (shortness of breath): suspected due to Acute CHF likely diastolic --  No oxygen desaturation.  -- Chest x-ray showed no infiltration, but patient does not have fever or leukocytosis.  -- Procalcitonin <0.10.  Clinically does not seem to have pneumonia.   -- Patient has 2+ bilateral leg edema and elevated BNP 202, indicating possible CHF, possibly diastolic CHF given long history  of hypertension. No 2D echo on record.   ==Patient is on Xarelto, low suspicions for PE  -Lasix 20 mg bid by IV--good UOP -2d echo results pending -Daily weights -strict I/O's -Low salt diet -Fluid restriction -[facility] cardiology to see pt   HTN (hypertension) -Lisinopril, metoprolol -IV hydralazine as needed   HLD (hyperlipidemia) -Zocor   Gout -Allopurinol   Atrial fibrillation, chronic (HCC) -Continue Xarelto and metoprolol   Diabetes mellitus without complication (HCC) no [id number] on record.  Patient taking metformin and Lantus at home -Sliding scale insulin -Decrease Lantus dose from 15 to 10 unit twice daily -Check A1c   Elevated lactic acid level: Has resolved.  Lactic acid 3.1 --> 1.6   UTI (urinary tract infection) -IV Rocephin--change to po kelfex          DVT ppx: On Xarelto Code Status: DNR per his wife Family Communication spoke with dter in the ER room Disposition Plan:  Anticipate discharge back to previous environment Consults called:  none Admission status and Level of care: Progressive Cardiac:  as inpt        Status is: Inpatient   Remains inpatient appropriate because:Inpatient level of care appropriate due to severity of illness   Dispo: The patient is from: Home              Anticipated d/c is to: Home              Patient currently is not medically stable to d/c.              Difficult to place patient No               TOTAL TIME TAKING CARE OF THIS PATIENT: 30 minutes.  >50% time spent on counselling and coordination of care  Note: This dictation was prepared with Dragon dictation along with smaller phrase technology. Any transcriptional errors that result from this process are unintentional.  Fritzi Mandes M.D    [facility]   CC: Primary care physician; [name], MD Patient ID: William Sawyer, male   DOB: [birth year], 87 y.o.   MRN: [id number]

## 2021-07-21 NOTE — ED Notes (Addendum)
Assisted patient with repositioning in the bed. HOB raised. A-fib continues on the monitor. Vitals are stable. No further needs at this time.

## 2021-07-21 NOTE — Consult Note (Signed)
Cardiology Consultation:   Patient ID: William Sawyer MRN: [id number]; DOB: [birth year]  Admit date: 07/20/2021 Date of Consult: 07/21/2021  PCP:  [name], MD   [place]  Cardiologist:  [facility], Dr. End Advanced Practice Provider:  No care team member to display Electrophysiologist:  None 6}    Patient Profile:   William Sawyer is a 87 y.o. male with a hx of atrial fibrillation on Xarelto, hypertension, hyperlipidemia, DM2, gout, and who is being seen today for the evaluation of shortness of breath/volume overload at the request of Dr. Posey Pronto.  History of Present Illness:   William Sawyer is an 87 year old male with PMH as above.  He has history of atrial fibrillation on Xarelto and metoprolol.  He does not have a regular cardiologist that has been managing his A. fib since his initial diagnosis "forever ago."   He reports a remote history of cigar use, quitting over 30 years ago.  He rarely drinks alcohol (every couple of months).    He denies a family history of cardiac disease.  He reports shortness of breath that has been going on for many months when he thinks about it today.  He states that he feels now that he may have been ignoring his shortness of breath until it progressed to the severity he was unable to ignore.  The night leading up to his admission, he woke up short of breath at approximately 3 AM.  His shortness of breath continued to worsen until he presented to [facility] later that morning.  He denies any associated chest pain.  No tachypalpitations, presyncope, or syncope.  He reports lower extremity edema that he has had "since he was a child."  This improves with leg elevation.  He denies any abdominal distention or bloating.  Of note, 2 weeks ago he held his Xarelto for 4 days for a dental surgery. He denies any asymmetric edema.   On 07/20/2021, he presented to the [facility] emergency department with increasing shortness of breath and chronic lower  extremity edema.  Initial vitals significant for BP 102/62, HR 97 bpm.  Labs showed hemoglobin 11.3, hematocrit 34.4, BNP 202.9, glucose 129, Ca++ 8.6, albumin 3.4, lactic acid 3.1. Tn 13. EKG showed atrial fibrillation 95 bpm, poor R wave progression in lead III, possible prior anterior infarct. Chest x-ray concerning for lower lobe pneumonia.  He was started on antibiotics.  Cardiology consulted for suspicion for diastolic heart failure.  Echo still pending.  At the time of cardiology consultation he denies any chest pain or shortness of breath.  He reports that he feels back to his baseline.   Past Medical History:  Diagnosis Date   Atrial fibrillation, chronic (HCC)    Diabetes mellitus without complication ([place])    Gout    HLD (hyperlipidemia)    HTN (hypertension)     Past Surgical History:  Procedure Laterality Date   Left hip replacement     REPLACEMENT TOTAL KNEE BILATERAL Bilateral      Home Medications:  Prior to Admission medications   Medication Sig Start Date End Date Taking? Authorizing Provider  allopurinol (ZYLOPRIM) 300 MG tablet Take 300 mg by mouth daily. 05/15/21  Yes [provider]  celecoxib (CELEBREX) 200 MG capsule Take 200 mg by mouth daily. 05/15/21  Yes [provider]  furosemide (LASIX) 20 MG tablet Take 20 mg by mouth at bedtime. 05/15/21  Yes [provider]  LANTUS SOLOSTAR 100 UNIT/ML Solostar Pen Inject 15  Units into the skin in the morning and at bedtime. 07/06/21  Yes [provider]  lisinopril (ZESTRIL) 20 MG tablet Take 20 mg by mouth daily. 06/05/21  Yes [provider]  metFORMIN (GLUCOPHAGE) 1000 MG tablet Take 1,000 mg by mouth 2 (two) times daily. 05/16/21  Yes [provider]  metoprolol succinate (TOPROL-XL) 50 MG 24 hr tablet Take 50 mg by mouth daily. 05/15/21  Yes [provider]  simvastatin (ZOCOR) 20 MG tablet Take 20 mg by mouth at bedtime. 06/04/21  Yes [provider]   terazosin (HYTRIN) 10 MG capsule Take 10 mg by mouth at bedtime. 06/15/21  Yes [provider]  XARELTO 20 MG TABS tablet Take 20 mg by mouth daily. 06/04/21  Yes [provider]    Inpatient Medications: Scheduled Meds:  allopurinol  300 mg Oral Daily   celecoxib  200 mg Oral Daily   [START ON 07/22/2021] cephALEXin  500 mg Oral Q8H   furosemide  20 mg Intravenous Q12H   insulin aspart  0-5 Units Subcutaneous QHS   insulin aspart  0-9 Units Subcutaneous TID WC   insulin glargine-yfgn  10 Units Subcutaneous BID   lisinopril  20 mg Oral Daily   metoprolol succinate  50 mg Oral Daily   rivaroxaban  20 mg Oral Daily   simvastatin  20 mg Oral QHS   terazosin  10 mg Oral QHS   Continuous Infusions:  magnesium sulfate bolus IVPB     magnesium sulfate bolus IVPB     PRN Meds: acetaminophen (TYLENOL) oral liquid 160 mg/5 mL, albuterol, dextromethorphan-guaiFENesin, hydrALAZINE, ondansetron (ZOFRAN) IV  Allergies:   No Known Allergies  Social History:   Social History   Socioeconomic History   Marital status: Married    Spouse name: Not on file   Number of children: Not on file   Years of education: Not on file   Highest education level: Not on file  Occupational History   Not on file  Tobacco Use   Smoking status: Never   Smokeless tobacco: Never  Substance and Sexual Activity   Alcohol use: Not Currently   Drug use: Never   Sexual activity: Not on file  Other Topics Concern   Not on file  Social History Narrative   Not on file   Social Determinants of Health   Financial Resource Strain: Not on file  Food Insecurity: Not on file  Transportation Needs: Not on file  Physical Activity: Not on file  Stress: Not on file  Social Connections: Not on file  Intimate Partner Violence: Not on file    Family History:    Family History  Problem Relation Age of Onset   Breast cancer Mother    Pancreatic cancer Father      ROS:  Please see the history of  present illness.  Review of Systems  Respiratory:  Positive for shortness of breath.   Cardiovascular:  Positive for orthopnea, leg swelling and PND. Negative for chest pain and palpitations.  Musculoskeletal:  Negative for falls.  Neurological:  Negative for dizziness, loss of consciousness and weakness.  All other systems reviewed and are negative.  All other ROS reviewed and negative.     Physical Exam/Data:   Vitals:   07/21/21 1023 07/21/21 1100 07/21/21 1300 07/21/21 1418  BP: (!) 110/95 100/71 108/70 92/78  Pulse:    79  Resp:    18  Temp:    97.7 F (36.5 C)  TempSrc:  Oral  SpO2:  95% 100% 97%  Weight:    111.6 kg  Height:        Intake/Output Summary (Last 24 hours) at 07/21/2021 1438 Last data filed at 07/21/2021 1303 Gross per 24 hour  Intake 710 ml  Output 3800 ml  Net -3090 ml   Last 3 Weights 07/21/2021 07/20/2021  Weight (lbs) 246 lb 1.6 oz 260 lb  Weight (kg) 111.63 kg 117.935 kg     Body mass index is 32.47 kg/m.  General:  Well nourished, well developed, in no acute distress.  Seated at the edge of the bed. HEENT: normal Vascular: No carotid bruits; radial pulses 2+ bilaterally Cardiac:  normal S1, S2; IRIR; 2/6 RUSB holosystolic murmur and 2/6 systolic murmur appreciated along LLSB as well Lungs:  clear to auscultation bilaterally with slightly reduced breath sounds at the right base, no wheezing, rhonchi or rales  Abd: soft, nontender, no hepatomegaly  Ext: bilateral 1+ woody edema with erythema Musculoskeletal:  No deformities, BUE and BLE strength normal and equal Skin: warm and dry  Neuro:  CNs 2-12 intact, no focal abnormalities noted Psych:  Normal affect   EKG:  The EKG was personally reviewed and demonstrates:  atrial fibrillation 95 bpm, poor R wave progression in lead III, possible prior anterior infarct. Telemetry:  Telemetry was personally reviewed and demonstrates:  Afib, PVCs, 80s  Relevant CV Studies: Pending echo  Laboratory  Data:  High Sensitivity Troponin:   Recent Labs  Lab 07/20/21 0948 07/20/21 1312 07/20/21 1546  TROPONINIHS '15 13 13     '$ Chemistry Recent Labs  Lab 07/20/21 0948 07/21/21 0622  NA 139 138  K 4.0 3.6  CL 96* 96*  CO2 31 32  GLUCOSE 129* 72  BUN 16 15  CREATININE 0.97 0.89  CALCIUM 8.6* 8.6*  GFRNONAA >60 >60  ANIONGAP 12 10    Recent Labs  Lab 07/20/21 0948  PROT 6.8  ALBUMIN 3.4*  AST 17  ALT 7  ALKPHOS 58  BILITOT 0.8   Hematology Recent Labs  Lab 07/20/21 0948 07/21/21 0622  WBC 8.3 7.1  RBC 3.65* 3.38*  HGB 11.3* 10.5*  HCT 34.4* 32.2*  MCV 94.2 95.3  MCH 31.0 31.1  MCHC 32.8 32.6  RDW 15.2 15.1  PLT 262 236   BNP Recent Labs  Lab 07/20/21 0948  BNP 202.9*    DDimer No results for input(s): DDIMER in the last 168 hours.   Radiology/Studies:  DG Chest 2 View  Result Date: 07/20/2021 CLINICAL DATA:  Shortness of breath EXAM: CHEST - 2 VIEW COMPARISON:  None. FINDINGS: Heart is normal size. Airspace disease in the right lower lobe with small right pleural effusion. Left lung clear. No acute bony abnormality. IMPRESSION: Right lower lobe airspace disease concerning for pneumonia. Small right effusion. Electronically Signed   By: Rolm Baptise M.D.   On: 07/20/2021 10:13     Assessment and Plan:   Shortness of breath -- Reports improvement in shortness of breath since admission.  Presented with worsening shortness of breath for many months and PND the night before.  BNP 202.9.  EKG showed atrial fibrillation with patient report that he is normally asymptomatic in A. fib.  Chest x-ray showed small right pleural effusion and exam significant for LEE with patient report this is a chronic finding.  Also noted on exam or systolic murmurs as above.  Agree with echo to assess EF, wall motion, valvular function, and rule out acute structural abnormalities.  If EF reduced, further ischemic work-up recommended as also considered is shortness of breath as anginal  equivalent though HS Tn nl and EKG without acute ST/T changes.  Risk factors for CAD include age, male, previous tobacco use, DM2.  Also considered is SOB due to PE as he did skip 4 doses of his Xarelto--will defer to IM regarding any further workup. Continue IV lasix and PTA lisinopril, BB, and statin. Monitor I's/O's, daily standing weights.  CHF education.  Daily BMET -ensure stable renal function and maintain electrolytes at goal.  Systolic murmur --As above under exam with echo pending.  Hypokalemia --Potassium 3.6.  Replete with goal 4.0, especially is on IV Lasix.  Check magnesium.  Anemia --Unclear etiology.  Recommend further work-up, especially as patient is on Xarelto.  Hypoalbuminemia --Consider nutrition consult, and that this could be contributing to his edema.  Further recommendations per IM.  Atrial fibrillation with RVR --States atrial fibrillation was diagnosed many years ago, though he does not have a regular cardiologist.  He has been maintained on Xarelto and metoprolol.  Continue metoprolol with current rate is well controlled.  Continue anticoagulation with Xarelto.  He reports holding his Xarelto for 4 days over the last 2 weeks for dental surgery.  Otherwise, he is compliant with his Xarelto.  No plan for cardioversion, given he is not therapeutic on anticoagulation and is likely permanent atrial fibrillation.  Hypertension -- Current BP well controlled to soft.  Continue current medications.  HLD --Continue statin.  Most recent LDL 60 and at goal of below 70.  For questions or updates, please contact [place] Please consult [email or web address] for contact info under    Signed, Arvil Chaco, PA-C  07/21/2021 2:38 PM

## 2021-07-21 NOTE — Progress Notes (Signed)
PHARMACY NOTE:  ANTIMICROBIAL RENAL DOSAGE ADJUSTMENT  Current antimicrobial regimen includes a mismatch between antimicrobial dosage and estimated renal function.  As per policy approved by the Pharmacy & Therapeutics and Medical Executive Committees, the antimicrobial dosage will be adjusted accordingly.  Current antimicrobial dosage:  cephalexin '500mg'$  q12hrs  Indication: UTI  Renal Function:  Estimated Creatinine Clearance: 79.5 mL/min (by C-G formula based on SCr of 0.89 mg/dL). '[]'$      On intermittent HD, scheduled: '[]'$      On CRRT    Antimicrobial dosage has been changed to:  cephalexin '500mg'$  q8hrs  Additional comments:Based on BMI of 32.5 and CrCl=79.66m/min, dosage freq shortened to q8hrs.   Thank you for allowing pharmacy to be a part of this patient's care.  SBerta Minor [facility]8/01/2021 2:36 PM

## 2021-07-21 NOTE — Progress Notes (Signed)
Inpatient Diabetes Program Recommendations  AACE/ADA: New Consensus Statement on Inpatient Glycemic Control (2015)  Target Ranges:  Prepandial:   less than 140 mg/dL      Peak postprandial:   less than 180 mg/dL (1-2 hours)      Critically ill patients:  140 - 180 mg/dL   Lab Results  Component Value Date   GLUCAP 150 (H) 07/21/2021   HGBA1C 6.1 (H) 07/20/2021    Review of Glycemic Control Results for William Sawyer, William Sawyer (MRN [id number]) as of 07/21/2021 12:14  Ref. Range 07/20/2021 22:07 07/21/2021 08:01 07/21/2021 08:35 07/21/2021 09:07 07/21/2021 12:03  Glucose-Capillary Latest Ref Range: 70 - 99 mg/dL 124 (H) 66 (L) 59 (L) 102 (H) 150 (H)   Diabetes history: DM2 Outpatient Diabetes medications: Lantus 15 units BID, Metformin 1000 mg BID Current orders for Inpatient glycemic control: Semglee 10 units BID, Novolog 0-9 TID and 0-5 QHS  Inpatient Diabetes Program Recommendations:     Hypoglycemia this morning.  Please consider Semglee 10 QAM instead of BID for now.  Patient refused 12:00 dose.    Will continue to follow while inpatient.  Thank you, Reche Dixon, RN, BSN Diabetes Coordinator Inpatient Diabetes Program [phone] (team pager from 8a-5p)

## 2021-07-21 NOTE — Progress Notes (Signed)
[place] for Electrolyte Monitoring and Replacement   Recent Labs: Potassium (mmol/L)  Date Value  07/21/2021 3.6   Magnesium (mg/dL)  Date Value  07/21/2021 1.1 (L)   Calcium (mg/dL)  Date Value  07/21/2021 8.6 (L)   Albumin (g/dL)  Date Value  07/20/2021 3.4 (L)   Sodium (mmol/L)  Date Value  07/21/2021 138     Goal of Therapy:  Electrolytes WNL  Plan:  Will give magnesium 4 g IV x [place] ,PharmD, BCPS Clinical Pharmacist 07/21/2021 1:34 PM

## 2021-07-21 NOTE — ED Notes (Signed)
Patient is resting quietly. Chest rise and fall observed. No acute distress noted.

## 2021-07-21 NOTE — Progress Notes (Signed)
*  PRELIMINARY RESULTS* Echocardiogram 2D Echocardiogram has been performed.  William Sawyer [name] 07/21/2021, 11:54 AM

## 2021-07-22 DIAGNOSIS — R0602 Shortness of breath: Secondary | ICD-10-CM

## 2021-07-22 DIAGNOSIS — I482 Chronic atrial fibrillation, unspecified: Secondary | ICD-10-CM

## 2021-07-22 DIAGNOSIS — I350 Nonrheumatic aortic (valve) stenosis: Secondary | ICD-10-CM

## 2021-07-22 LAB — GLUCOSE, CAPILLARY
Glucose-Capillary: 119 mg/dL — ABNORMAL HIGH (ref 70–99)
Glucose-Capillary: 136 mg/dL — ABNORMAL HIGH (ref 70–99)
Glucose-Capillary: 144 mg/dL — ABNORMAL HIGH (ref 70–99)
Glucose-Capillary: 151 mg/dL — ABNORMAL HIGH (ref 70–99)
Glucose-Capillary: 261 mg/dL — ABNORMAL HIGH (ref 70–99)

## 2021-07-22 LAB — BASIC METABOLIC PANEL
Anion gap: 12 (ref 5–15)
BUN: 15 mg/dL (ref 8–23)
CO2: 29 mmol/L (ref 22–32)
Calcium: 8.5 mg/dL — ABNORMAL LOW (ref 8.9–10.3)
Chloride: 93 mmol/L — ABNORMAL LOW (ref 98–111)
Creatinine, Ser: 0.88 mg/dL (ref 0.61–1.24)
GFR, Estimated: >60 mL/min (ref >60)
Glucose, Bld: 232 mg/dL — ABNORMAL HIGH (ref 70–99)
Potassium: 4 mmol/L (ref 3.5–5.1)
Sodium: 134 mmol/L — ABNORMAL LOW (ref 135–145)

## 2021-07-22 LAB — URINE CULTURE: Culture: >=100000 — AB

## 2021-07-22 LAB — MAGNESIUM: Magnesium: 2.4 mg/dL (ref 1.7–2.4)

## 2021-07-22 MED ORDER — ACETAMINOPHEN 325 MG PO TABS
650.0000 mg | ORAL_TABLET | Freq: Four times a day (QID) | ORAL | Status: DC | PRN
  Administered 2021-07-23: 650 mg via ORAL
  Filled 2021-07-22: qty 2

## 2021-07-22 MED ORDER — NITROFURANTOIN MONOHYD MACRO 100 MG PO CAPS
100.0000 mg | ORAL_CAPSULE | Freq: Two times a day (BID) | ORAL | Status: DC
  Administered 2021-07-22 – 2021-07-24 (×5): 100 mg via ORAL
  Filled 2021-07-22 (×6): qty 1

## 2021-07-22 MED ORDER — ENSURE MAX PROTEIN PO LIQD
11.0000 [oz_av] | Freq: Every day | ORAL | Status: DC
  Administered 2021-07-22 – 2021-07-23 (×2): 11 [oz_av] via ORAL
  Filled 2021-07-22: qty 330

## 2021-07-22 NOTE — Progress Notes (Addendum)
Progress Note  Patient Name: William Sawyer Date of Encounter: 07/22/2021  Primary Cardiologist: [place] to feel better with diuresis.  No chest pain.  No shortness of breath. Lower extremity edema is significantly improved from yesterday. Denies dizziness, despite low BP.   He just spoke with the nutritionist over the phone.  He is working to eat better at home. We reviewed his echo results.  Agreeable to follow-up with our outpatient clinic for further imaging of his aortic valve.  Explained the etiology of aortic valve stenosis and management/treatment options.  Also discussed recommendations going forward.  Of note, he ambulates at home with a walker and cane.  Inpatient Medications    Scheduled Meds:  allopurinol  300 mg Oral Daily   celecoxib  200 mg Oral Daily   cephALEXin  500 mg Oral Q8H   furosemide  20 mg Intravenous Q12H   insulin aspart  0-5 Units Subcutaneous QHS   insulin aspart  0-9 Units Subcutaneous TID WC   insulin glargine-yfgn  10 Units Subcutaneous BID   metoprolol succinate  50 mg Oral BID   rivaroxaban  20 mg Oral Daily   simvastatin  20 mg Oral QHS   terazosin  10 mg Oral QHS   Continuous Infusions:  PRN Meds: acetaminophen (TYLENOL) oral liquid 160 mg/5 mL, albuterol, dextromethorphan-guaiFENesin, hydrALAZINE, ondansetron (ZOFRAN) IV   Vital Signs    Vitals:   07/22/21 0017 07/22/21 0553 07/22/21 0600 07/22/21 0727  BP: 93/65 110/67  95/65  Pulse: 77 77  72  Resp: '18 18  18  '$ Temp: 98 F (36.7 C) 97.8 F (36.6 C)  97.6 F (36.4 C)  TempSrc: Oral Oral    SpO2: 96% 96%  95%  Weight:   108.8 kg   Height:        Intake/Output Summary (Last 24 hours) at 07/22/2021 0955 Last data filed at 07/22/2021 [id number] Gross per 24 hour  Intake 460 ml  Output 2975 ml  Net -2515 ml   Last 3 Weights 07/22/2021 07/21/2021 07/20/2021  Weight (lbs) 239 lb 13.8 oz 246 lb 1.6 oz 260 lb  Weight (kg) 108.8 kg 111.63 kg 117.935 kg       Telemetry    Afib, rates 70s to 90s- Personally Reviewed  ECG    No new tracings - Personally Reviewed  Physical Exam   GEN: No acute distress.  Elderly male.  Lying in bed. Neck: No JVD Cardiac: IRIR, 3/6 crescendo/decrescendo systolic murmur RUSB, 1/6 systolic murmur LLSB.  Respiratory: Clear to auscultation bilaterally. GI: Soft, nontender, non-distended  MS: Mild bilateral edema; No deformity. Neuro:  Nonfocal  Psych: Normal affect   Labs    High Sensitivity Troponin:   Recent Labs  Lab 07/20/21 0948 07/20/21 1312 07/20/21 1546  TROPONINIHS '15 13 13      '$ Chemistry Recent Labs  Lab 07/20/21 0948 07/21/21 0622  NA 139 138  K 4.0 3.6  CL 96* 96*  CO2 31 32  GLUCOSE 129* 72  BUN 16 15  CREATININE 0.97 0.89  CALCIUM 8.6* 8.6*  PROT 6.8  --   ALBUMIN 3.4*  --   AST 17  --   ALT 7  --   ALKPHOS 58  --   BILITOT 0.8  --   GFRNONAA >60 >60  ANIONGAP 12 10     Hematology Recent Labs  Lab 07/20/21 0948 07/21/21 0622  WBC 8.3 7.1  RBC 3.65* 3.38*  HGB 11.3* 10.5*  HCT 34.4* 32.2*  MCV 94.2 95.3  MCH 31.0 31.1  MCHC 32.8 32.6  RDW 15.2 15.1  PLT 262 236    BNP Recent Labs  Lab 07/20/21 0948  BNP 202.9*     DDimer No results for input(s): DDIMER in the last 168 hours.   Radiology    DG Chest 2 View  Result Date: 07/20/2021 CLINICAL DATA:  Shortness of breath EXAM: CHEST - 2 VIEW COMPARISON:  None. FINDINGS: Heart is normal size. Airspace disease in the right lower lobe with small right pleural effusion. Left lung clear. No acute bony abnormality. IMPRESSION: Right lower lobe airspace disease concerning for pneumonia. Small right effusion. Electronically Signed   By: Rolm Baptise M.D.   On: 07/20/2021 10:13   ECHOCARDIOGRAM COMPLETE  Result Date: 07/21/2021    ECHOCARDIOGRAM REPORT   Patient Name:   William Sawyer Date of Exam: 07/21/2021 Medical Rec #:  [id number]     Height:       73.0 in Accession #:    [id number]    Weight:       260.0 lb  Date of Birth:  [birth year]    BSA:          2.405 m Patient Age:    87 years      BP:           100/71 mmHg Patient Gender: M             HR:           78 bpm. Exam Location:  [facility] Procedure: 2D Echo, Color Doppler, Cardiac Doppler and Intracardiac            Opacification Agent Indications:     I50.31 congestive heart failure-Acute Diastolic  History:         Patient has no prior history of Echocardiogram examinations.                  Arrythmias:Atrial Fibrillation; Risk Factors:Hypertension,                  Diabetes and Dyslipidemia.  Sonographer:     Charmayne Sheer RDCS (AE) Referring Phys:  Baker Janus Soledad Gerlach NIU Diagnosing Phys: Nelva Bush MD  Sonographer Comments: Suboptimal parasternal window, Technically difficult study due to poor echo windows and suboptimal subcostal window. Image acquisition challenging due to patient body habitus. IMPRESSIONS  1. Left ventricular ejection fraction, by estimation, is >55%. The left ventricle has normal function. Left ventricular endocardial border not optimally defined to evaluate regional wall motion. There is mild left ventricular hypertrophy. Left ventricular diastolic function could not be evaluated.  2. Right ventricular systolic function is normal. The right ventricular size is normal.  3. Left atrial size was severely dilated.  4. The mitral valve was not well visualized. Mild mitral valve regurgitation. No evidence of mitral stenosis. Moderate mitral annular calcification.  5. The aortic valve was not well visualized. Aortic valve regurgitation is not visualized. Moderate to severe aortic valve stenosis. Aortic valve mean gradient measures 38.0 mmHg. Valve area cannot be calculated due to poor image quality. Dimension less  index (VTI) is 0.26, consistent with borderline severe aortic stenosis. FINDINGS  Left Ventricle: Left ventricular ejection fraction, by estimation, is >55%. The left ventricle has normal function. Left ventricular endocardial border not optimally  defined to evaluate regional wall motion. Definity contrast agent was given IV to delineate the left ventricular endocardial borders. The left ventricular internal cavity size was normal in size. There is  mild left ventricular hypertrophy. Left ventricular diastolic function could not be evaluated due to atrial fibrillation. Left ventricular diastolic function could not be evaluated. Right Ventricle: The right ventricular size is normal. Right vetricular wall thickness was not well visualized. Right ventricular systolic function is normal. Left Atrium: Left atrial size was severely dilated. Right Atrium: Right atrial size was not well visualized. Pericardium: Trivial pericardial effusion is present. Mitral Valve: The mitral valve was not well visualized. Moderate mitral annular calcification. Mild mitral valve regurgitation. No evidence of mitral valve stenosis. MV peak gradient, 9.6 mmHg. The mean mitral valve gradient is 4.0 mmHg. Tricuspid Valve: The tricuspid valve is not well visualized. Aortic Valve: The aortic valve was not well visualized. Aortic valve regurgitation is not visualized. Moderate to severe aortic stenosis is present. Aortic valve mean gradient measures 38.0 mmHg. Aortic valve peak gradient measures 57.5 mmHg. Pulmonic Valve: The pulmonic valve was not well visualized. Aorta: The aortic root was not well visualized. IAS/Shunts: The interatrial septum was not well visualized.  LEFT VENTRICLE PLAX 2D LVIDd:         3.78 cm     Diastology LVIDs:         1.56 cm     LV e' medial:    6.09 cm/s LV PW:         1.17 cm     LV E/e' medial:  23.2 LV IVS:        1.26 cm     LV e' lateral:   6.74 cm/s                            LV E/e' lateral: 21.0  LV Volumes (MOD) LV vol d, MOD A2C: 61.8 ml LV vol d, MOD A4C: 78.8 ml LV vol s, MOD A2C: 28.7 ml LV vol s, MOD A4C: 31.3 ml LV SV MOD A2C:     33.1 ml LV SV MOD A4C:     78.8 ml LV SV MOD BP:      40.6 ml LEFT ATRIUM            Index LA Vol (A4C): 127.0 ml  52.80 ml/m  AORTIC VALVE AV Vmax:           379.00 cm/s AV Vmean:          276.500 cm/s AV VTI:            0.806 m AV Peak Grad:      57.5 mmHg AV Mean Grad:      38.0 mmHg LVOT Vmax:         81.70 cm/s LVOT Vmean:        53.500 cm/s LVOT VTI:          0.176 m LVOT/AV VTI ratio: 0.22 MITRAL VALVE MV Area (PHT): 2.93 cm     SHUNTS MV Peak grad:  9.6 mmHg     Systemic VTI: 0.18 m MV Mean grad:  4.0 mmHg MV Vmax:       1.55 m/s MV Vmean:      85.7 cm/s MV Decel Time: 259 msec MV E velocity: 141.33 cm/s Nelva Bush MD Electronically signed by Nelva Bush MD Signature Date/Time: 07/21/2021/4:35:54 PM    Final     Cardiac Studies   Echo 07/21/21 . Left ventricular ejection fraction, by estimation, is >55%. The left  ventricle has normal function. Left ventricular endocardial border not  optimally defined to evaluate regional wall motion.  There is mild left  ventricular hypertrophy. Left  ventricular diastolic function could not be evaluated.   2. Right ventricular systolic function is normal. The right ventricular  size is normal.   3. Left atrial size was severely dilated.   4. The mitral valve was not well visualized. Mild mitral valve  regurgitation. No evidence of mitral stenosis. Moderate mitral annular  calcification.   5. The aortic valve was not well visualized. Aortic valve regurgitation  is not visualized. Moderate to severe aortic valve stenosis. Aortic valve  mean gradient measures 38.0 mmHg. Valve area cannot be calculated due to  poor image quality. Dimension less   index (VTI) is 0.26, consistent with borderline severe aortic stenosis.   Patient Profile     87 y.o. male with a hx of permanent atrial fibrillation on Xarelto, hypertension, hyperlipidemia, DM2, gout, and who is being seen today for the evaluation of shortness of breath/volume overload at the request of Dr. Posey Pronto and found to have severe aortic stenosis and HFpEF.   Assessment & Plan    AOC HFpEF -- Reports  improvement in shortness of breath since admission and start of IV diuresis. Suspect that volume status exacerbated by severe aortic stenosis and rapid ventricular rates in Afib.  Volume status has improved significantly since start of diuresis. --Ordered STAT BMET as currently no labs for today.  Pending results of BMET, will adjust diuresis as indicated. Suspect he is nearing his baseline and can transition to oral diuresis today or tomorrow. For now, and pending result of labs, will continue current IV Lasix 20 mg BID as long as BP allows given softer pressures. --Daily BMET. monitor Cr and electrolytes. Net -5.3L for the admission and -1.8L yesterday. Wt 111.6kg  108.8kg  --Continue Toprol '50mg'$  BID for rate control of Afib as below. --Continue to hold ACE.  ACE held to prevent hypotension with known severe aortic stenosis.  This was explained to the patient, as well as the thought process behind this recommendation. Holding terazosin for now to allow room for further diuresis at this time.    Severe Aortic Stenosis  -- Likely contributing to his volume overload and shortness of breath.  Echo as above with suboptimal quality and moderate to severe aortic valve stenosis.  Aortic valve mean gradient measures 38.0 mmHg.  Aortic valve was not well visualized.  Valve area could not be calculated due to poor image quality.  VTI 0.26, consistent with borderline severe aortic stenosis.  Consider repeat echo to obtain a better picture of the valve.  Could also consider invasive hemodynamic assessment with right and left heart cath or transesophageal echo to better understand his aortic valve disease.  If he continues to improve, this can be pursued as an outpatient.  Caution with antihypertensives and diuresis given severity of aortic valve disease. Discussed this with the patient in great detail.  He is agreeable to follow-up in the office and further imaging of his valve.   Hypokalemia --Ordered STAT BMET.  Potassium 3.6.  Replete with goal 4.0, especially is on IV Lasix.  Check magnesium.   Anemia --Unclear etiology.  Recommend further work-up per IM with pt on Xarelto.   Hypoalbuminemia -- IM has placed nutrition consult - agree.   Permanent Atrial fibrillation with RVR --Asx. Diagnosed many years ago, though he does not have a regular cardiologist.  He has been maintained on Xarelto and metoprolol.  Continue increased dose metoprolol with current rate is well controlled.  Continue anticoagulation with Xarelto.  No plan for DCCV.   Hypertension Current hypotension -- Current BP well controlled to soft.  Continue to hold ACE and avoid hypotension given severe aortic stenosis with ongoing diuresis. Recommend holding terazosin for now to allow room for ongoing diuresis. If ongoing hypotension despite holding these medications, we will need to reduce diuresis as above (and pending BMET).    HLD --Continue statin.  Most recent LDL 60 and at goal of below 70.  For questions or updates, please contact [place] Please consult [email or web address] for contact info under        Signed, Arvil Chaco, PA-C  07/22/2021, 9:55 AM

## 2021-07-22 NOTE — Progress Notes (Signed)
Mobility Specialist - Progress Note   07/22/21 1649  Mobility  Activity Ambulated in hall  Level of Assistance Minimal assist, patient does 75% or more  Assistive Device Front wheel walker  Distance Ambulated (ft) 40 ft  Mobility Ambulated with assistance in hallway  Mobility Response Tolerated well  Mobility performed by Mobility specialist  $Mobility charge 1 Mobility    Pre-mobility: 78 HR, 98% SpO2 During mobility: 118 HR, 94% SpO2 Post-mobility: 85 HR, 98% SpO2   Pt ambulated in hallway with minA and RW. No LOB. ModI for bed mobility. No dizziness. Stood with minA and extra time. Does present with anterior and L lateral lean during ambulation. Denied SOB on RA. A-fib HR ranging 78-126 bpm. Mildly winded after activity. Pt returned to bed with needs in reach.    Kathee Delton Mobility Specialist 07/22/21, 4:55 PM

## 2021-07-22 NOTE — Progress Notes (Signed)
PROGRESS NOTE    William Sawyer  [id number] DOB: 1933/01/30 DOA: 07/20/2021 PCP: [name], MD    Brief Narrative:  patient seen in the ER. Daughter at bedside. Came in after having increasing shortness of breath for last two months more so for last couple days. No diagnosis of CHF. Patient has chronic leg edema. He uses electric scooter in the house to get around. Does not ambulate much. Feels a lot better today. Good urine output of 3 L  07/22/2021-feeling better. No cp. Sob not at baseline.   Consultants:  cards  Procedures:   Antimicrobials:      Subjective: No cp, dizziness, lightheadeness.  Objective: Vitals:   07/22/21 0553 07/22/21 0600 07/22/21 0727 07/22/21 1123  BP: 110/67  95/65 94/63  Pulse: 77  72 61  Resp: '18  18 17  '$ Temp: 97.8 F (36.6 C)  97.6 F (36.4 C) 97.7 F (36.5 C)  TempSrc: Oral     SpO2: 96%  95% 98%  Weight:  108.8 kg    Height:        Intake/Output Summary (Last 24 hours) at 07/22/2021 1147 Last data filed at 07/22/2021 1128 Gross per 24 hour  Intake 460 ml  Output 3325 ml  Net -2865 ml   Filed Weights   07/20/21 0945 07/21/21 1418 07/22/21 0600  Weight: 117.9 kg 111.6 kg 108.8 kg    Examination:  General exam: Appears calm and comfortable  Respiratory system: Decreased breath sounds at bases Cardiovascular system: S1 & S2 heard, RRR. No JVD, murmurs, rubs, gallops or clicks.  Gastrointestinal system: Abdomen is nondistended, soft and nontender. Normal bowel sounds heard. Central nervous system: Alert and oriented.  Grossly intact Extremities: Bilateral edema Skin: Warm dry Psychiatry: Mood & affect appropriate.     Data Reviewed: I have personally reviewed following labs and imaging studies  CBC: Recent Labs  Lab 07/20/21 0948 07/21/21 0622  WBC 8.3 7.1  NEUTROABS 5.8  --   HGB 11.3* 10.5*  HCT 34.4* 32.2*  MCV 94.2 95.3  PLT 262 [id number]   Basic Metabolic Panel: Recent Labs  Lab 07/20/21 0948 07/21/21 0622  07/22/21 0354 07/22/21 1046  NA 139 138  --  134*  K 4.0 3.6  --  4.0  CL 96* 96*  --  93*  CO2 31 32  --  29  GLUCOSE 129* 72  --  232*  BUN 16 15  --  15  CREATININE 0.97 0.89  --  0.88  CALCIUM 8.6* 8.6*  --  8.5*  MG  --  1.1* 2.4  --    GFR: Estimated Creatinine Clearance: 79.4 mL/min (by C-G formula based on SCr of 0.88 mg/dL). Liver Function Tests: Recent Labs  Lab 07/20/21 0948  AST 17  ALT 7  ALKPHOS 58  BILITOT 0.8  PROT 6.8  ALBUMIN 3.4*   No results for input(s): LIPASE, AMYLASE in the last 168 hours. No results for input(s): AMMONIA in the last 168 hours. Coagulation Profile: No results for input(s): INR, PROTIME in the last 168 hours. Cardiac Enzymes: No results for input(s): CKTOTAL, CKMB, CKMBINDEX, TROPONINI in the last 168 hours. BNP (last 3 results) No results for input(s): PROBNP in the last 8760 hours. HbA1C: Recent Labs    07/20/21 1311  HGBA1C 6.1*   CBG: Recent Labs  Lab 07/21/21 1732 07/21/21 2058 07/22/21 0014 07/22/21 0726 07/22/21 1120  GLUCAP 159* 148* 144* 119* 261*   Lipid Profile: Recent Labs    07/21/21  0622  CHOL 103  HDL 35*  LDLCALC 60  TRIG 38  CHOLHDL 2.9   Thyroid Function Tests: No results for input(s): TSH, T4TOTAL, FREET4, T3FREE, THYROIDAB in the last 72 hours. Anemia Panel: No results for input(s): VITAMINB12, FOLATE, FERRITIN, TIBC, IRON, RETICCTPCT in the last 72 hours. Sepsis Labs: Recent Labs  Lab 07/20/21 1110 07/20/21 1311 07/20/21 1546  PROCALCITON  --  <0.10  --   LATICACIDVEN 3.1*  --  1.6    Recent Results (from the past 240 hour(s))  Resp Panel by RT-PCR (Flu A&B, Covid) Nasopharyngeal Swab     Status: None   Collection Time: 07/20/21 11:10 AM   Specimen: Nasopharyngeal Swab; Nasopharyngeal(NP) swabs in vial transport medium  Result Value Ref Range Status   SARS Coronavirus 2 by RT PCR NEGATIVE NEGATIVE Final    Comment: (NOTE) SARS-CoV-2 target nucleic acids are NOT  DETECTED.  The SARS-CoV-2 RNA is generally detectable in upper respiratory specimens during the acute phase of infection. The lowest concentration of SARS-CoV-2 viral copies this assay can detect is 138 copies/mL. A negative result does not preclude SARS-Cov-2 infection and should not be used as the sole basis for treatment or other patient management decisions. A negative result may occur with  improper specimen collection/handling, submission of specimen other than nasopharyngeal swab, presence of viral mutation(s) within the areas targeted by this assay, and inadequate number of viral copies(<138 copies/mL). A negative result must be combined with clinical observations, patient history, and epidemiological information. The expected result is Negative.  Fact Sheet for Patients:  EntrepreneurPulse.com.au  Fact Sheet for Healthcare Providers:  IncredibleEmployment.be  This test is no t yet approved or cleared by the [place] FDA and  has been authorized for detection and/or diagnosis of SARS-CoV-2 by FDA under an Emergency Use Authorization (EUA). This EUA will remain  in effect (meaning this test can be used) for the duration of the COVID-19 declaration under Section 564(b)(1) of the Act, 21 U.S.C.section 360bbb-3(b)(1), unless the authorization is terminated  or revoked sooner.       Influenza A by PCR NEGATIVE NEGATIVE Final   Influenza B by PCR NEGATIVE NEGATIVE Final    Comment: (NOTE) The Xpert Xpress SARS-CoV-2/FLU/RSV plus assay is intended as an aid in the diagnosis of influenza from Nasopharyngeal swab specimens and should not be used as a sole basis for treatment. Nasal washings and aspirates are unacceptable for Xpert Xpress SARS-CoV-2/FLU/RSV testing.  Fact Sheet for Patients: EntrepreneurPulse.com.au  Fact Sheet for Healthcare Providers: IncredibleEmployment.be  This test is not yet  approved or cleared by the [place] FDA and has been authorized for detection and/or diagnosis of SARS-CoV-2 by FDA under an Emergency Use Authorization (EUA). This EUA will remain in effect (meaning this test can be used) for the duration of the COVID-19 declaration under Section 564(b)(1) of the Act, 21 U.S.C. section 360bbb-3(b)(1), unless the authorization is terminated or revoked.  Performed at [facility] [address] [place], [place] [address]   Blood culture (routine x 2)     Status: None (Preliminary result)   Collection Time: 07/20/21 11:10 AM   Specimen: BLOOD  Result Value Ref Range Status   Specimen Description BLOOD RT FOREARM  Final   Special Requests   Final    BOTTLES DRAWN AEROBIC AND ANAEROBIC Blood Culture adequate volume   Culture   Final    NO GROWTH 2 DAYS Performed at [facility], [address] [place], [place] [address]  Report Status PENDING  Incomplete  Blood culture (routine x 2)     Status: None (Preliminary result)   Collection Time: 07/20/21 11:11 AM   Specimen: BLOOD  Result Value Ref Range Status   Specimen Description BLOOD RIGHT FOREARM  Final   Special Requests   Final    Blood Culture results may not be optimal due to an excessive volume of blood received in culture bottles   Culture   Final    NO GROWTH 2 DAYS Performed at [facility], [address] [place], [place] [address]    Report Status PENDING  Incomplete  Urine Culture     Status: Abnormal   Collection Time: 07/20/21  1:26 PM   Specimen: Urine, Random  Result Value Ref Range Status   Specimen Description   Final    URINE, RANDOM Performed at [facility], [address] [place], [place] [address]    Special Requests   Final    NONE Performed at [facility], [address] [place], [place] [address]    Culture >=100,000 COLONIES/mL ESCHERICHIA COLI (A)  Final   Report Status 07/22/2021 FINAL  Final    Organism ID, Bacteria ESCHERICHIA COLI (A)  Final      Susceptibility   Escherichia coli - MIC*    AMPICILLIN >=32 RESISTANT Resistant     CEFAZOLIN 32 INTERMEDIATE Intermediate     CEFEPIME <=0.12 SENSITIVE Sensitive     CEFTRIAXONE 0.5 SENSITIVE Sensitive     CIPROFLOXACIN <=0.25 SENSITIVE Sensitive     GENTAMICIN <=1 SENSITIVE Sensitive     IMIPENEM 0.5 SENSITIVE Sensitive     NITROFURANTOIN <=16 SENSITIVE Sensitive     TRIMETH/SULFA <=20 SENSITIVE Sensitive     AMPICILLIN/SULBACTAM >=32 RESISTANT Resistant     PIP/TAZO 16 SENSITIVE Sensitive     * >=100,000 COLONIES/mL ESCHERICHIA COLI         Radiology Studies: ECHOCARDIOGRAM COMPLETE  Result Date: 07/21/2021    ECHOCARDIOGRAM REPORT   Patient Name:   [name] Date of Exam: 07/21/2021 Medical Rec #:  [id number]     Height:       73.0 in Accession #:    [id number]    Weight:       260.0 lb Date of Birth:  1933/02/17    BSA:          2.405 m Patient Age:    61 years      BP:           100/71 mmHg Patient Gender: M             HR:           78 bpm. Exam Location:  [facility] Procedure: 2D Echo, Color Doppler, Cardiac Doppler and Intracardiac            Opacification Agent Indications:     I50.31 congestive heart failure-Acute Diastolic  History:         Patient has no prior history of Echocardiogram examinations.                  Arrythmias:Atrial Fibrillation; Risk Factors:Hypertension,                  Diabetes and Dyslipidemia.  Sonographer:     Charmayne Sheer RDCS (AE) Referring Phys:  Baker Janus Soledad Gerlach NIU Diagnosing Phys: Nelva Bush MD  Sonographer Comments: Suboptimal parasternal window, Technically difficult study due to poor echo windows and suboptimal subcostal window. Image acquisition challenging due  to patient body habitus. IMPRESSIONS  1. Left ventricular ejection fraction, by estimation, is >55%. The left ventricle has normal function. Left ventricular endocardial border not optimally defined to evaluate regional wall motion. There is  mild left ventricular hypertrophy. Left ventricular diastolic function could not be evaluated.  2. Right ventricular systolic function is normal. The right ventricular size is normal.  3. Left atrial size was severely dilated.  4. The mitral valve was not well visualized. Mild mitral valve regurgitation. No evidence of mitral stenosis. Moderate mitral annular calcification.  5. The aortic valve was not well visualized. Aortic valve regurgitation is not visualized. Moderate to severe aortic valve stenosis. Aortic valve mean gradient measures 38.0 mmHg. Valve area cannot be calculated due to poor image quality. Dimension less  index (VTI) is 0.26, consistent with borderline severe aortic stenosis. FINDINGS  Left Ventricle: Left ventricular ejection fraction, by estimation, is >55%. The left ventricle has normal function. Left ventricular endocardial border not optimally defined to evaluate regional wall motion. Definity contrast agent was given IV to delineate the left ventricular endocardial borders. The left ventricular internal cavity size was normal in size. There is mild left ventricular hypertrophy. Left ventricular diastolic function could not be evaluated due to atrial fibrillation. Left ventricular diastolic function could not be evaluated. Right Ventricle: The right ventricular size is normal. Right vetricular wall thickness was not well visualized. Right ventricular systolic function is normal. Left Atrium: Left atrial size was severely dilated. Right Atrium: Right atrial size was not well visualized. Pericardium: Trivial pericardial effusion is present. Mitral Valve: The mitral valve was not well visualized. Moderate mitral annular calcification. Mild mitral valve regurgitation. No evidence of mitral valve stenosis. MV peak gradient, 9.6 mmHg. The mean mitral valve gradient is 4.0 mmHg. Tricuspid Valve: The tricuspid valve is not well visualized. Aortic Valve: The aortic valve was not well visualized.  Aortic valve regurgitation is not visualized. Moderate to severe aortic stenosis is present. Aortic valve mean gradient measures 38.0 mmHg. Aortic valve peak gradient measures 57.5 mmHg. Pulmonic Valve: The pulmonic valve was not well visualized. Aorta: The aortic root was not well visualized. IAS/Shunts: The interatrial septum was not well visualized.  LEFT VENTRICLE PLAX 2D LVIDd:         3.78 cm     Diastology LVIDs:         1.56 cm     LV e' medial:    6.09 cm/s LV PW:         1.17 cm     LV E/e' medial:  23.2 LV IVS:        1.26 cm     LV e' lateral:   6.74 cm/s                            LV E/e' lateral: 21.0  LV Volumes (MOD) LV vol d, MOD A2C: 61.8 ml LV vol d, MOD A4C: 78.8 ml LV vol s, MOD A2C: 28.7 ml LV vol s, MOD A4C: 31.3 ml LV SV MOD A2C:     33.1 ml LV SV MOD A4C:     78.8 ml LV SV MOD BP:      40.6 ml LEFT ATRIUM            Index LA Vol (A4C): 127.0 ml 52.80 ml/m  AORTIC VALVE AV Vmax:           379.00 cm/s AV Vmean:  276.500 cm/s AV VTI:            0.806 m AV Peak Grad:      57.5 mmHg AV Mean Grad:      38.0 mmHg LVOT Vmax:         81.70 cm/s LVOT Vmean:        53.500 cm/s LVOT VTI:          0.176 m LVOT/AV VTI ratio: 0.22 MITRAL VALVE MV Area (PHT): 2.93 cm     SHUNTS MV Peak grad:  9.6 mmHg     Systemic VTI: 0.18 m MV Mean grad:  4.0 mmHg MV Vmax:       1.55 m/s MV Vmean:      85.7 cm/s MV Decel Time: 259 msec MV E velocity: 141.33 cm/s Nelva Bush MD Electronically signed by Nelva Bush MD Signature Date/Time: 07/21/2021/4:35:54 PM    Final         Scheduled Meds:  allopurinol  300 mg Oral Daily   celecoxib  200 mg Oral Daily   cephALEXin  500 mg Oral Q8H   furosemide  20 mg Intravenous Q12H   insulin aspart  0-5 Units Subcutaneous QHS   insulin aspart  0-9 Units Subcutaneous TID WC   insulin glargine-yfgn  10 Units Subcutaneous BID   metoprolol succinate  50 mg Oral BID   Ensure Max Protein  11 oz Oral Daily   rivaroxaban  20 mg Oral Daily   simvastatin  20 mg  Oral QHS   Continuous Infusions:  Assessment & Plan:   Principal Problem:   SOB (shortness of breath) Active Problems:   HTN (hypertension)   HLD (hyperlipidemia)   Gout   Atrial fibrillation, chronic (HCC)   Diabetes mellitus without complication (HCC)   Elevated lactic acid level   UTI (urinary tract infection)   Aortic valve stenosis   William Sawyer is a 87 y.o. 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    Acute HFpEF-  Improving , but not at baseline from sob stand point Cards following Continue iv lasix today Monitor electrolytes I O's, daily weight     HTN (hypertension) Stable, on low side Continue metoprolol Holding lisinopril per cards in order to prevent hypotension in setting of significant AS    HLD (hyperlipidemia) Continue Zocor   Gout Continue allopurinol   Atrial fibrillation, chronic (HCC) -Continue Xarelto and metoprolol   Diabetes mellitus without complication (HCC) no [id number] on record.  Patient taking metformin and Lantus at home -Sliding scale insulin Continue Lantus    Elevated lactic acid level: Has resolved.  Lactic acid 3.1 --> 1.6   UTI (urinary tract infection) -IV Rocephin--change to po kelfex       DVT prophylaxis: Xarelto Code Status: DNR Family Communication: None at bedside Disposition Plan:  Status is: Inpatient  Remains inpatient appropriate because:IV treatments appropriate due to intensity of illness or inability to take PO  Dispo: The patient is from: Home              Anticipated d/c is to: Home              Patient currently is not medically stable to d/c.   Difficult to place patient No            LOS: 2 days   Time spent: 35 minutes with more than 50% on COC  Nolberto Hanlon, MD [facility] Pager [phone]  If 7PM-7AM, please contact  night-coverage 07/22/2021, 11:47 AM

## 2021-07-22 NOTE — Progress Notes (Signed)
Initial Nutrition Assessment  DOCUMENTATION CODES:  Not applicable  INTERVENTION:  Continue current diet as ordered, encourage PO intake Ensure Max po 1x/d, each supplement provides 150 kcal and 30 grams of protein.   NUTRITION DIAGNOSIS:  Unintentional weight loss related to decreased appetite as evidenced by per patient/family report.  GOAL:  Patient will meet greater than or equal to 90% of their needs  MONITOR:  PO intake, Supplement acceptance, I & O's  REASON FOR ASSESSMENT:  Malnutrition Screening Tool    ASSESSMENT:  87 y.o. male with medical history of HTN, HLD, DM, gout, and atrial fibrillation presented to ED from home with shortness of breath.  Pt not hypoxic in ED and imaging not suggestive of pneumonia. Edema present to the BLE and elevated BNP. Cardiology consulted and determined workup consistent with acute HFpEF in the setting of probable severe aortic stenosis. Good responses to dieresis this admission.   Called pt on room phone to discuss reported weight loss and decreased appetite. Pt reports that his appetite has been great this admission, but it varies at home. Reports he always has breakfast and then will either have 1 or 2 meals later in the day. Mostly drinks coke at home.  Discussed weight loss. Significant loss seen this admission related to fluid loss. Pt reports that over the last few years he went from 290 lb down to about 246 lb. States he was not losing weight intentionally, but has been eating less as he has gotten older.   Did discuss the need to cut back on sodium intake - encouraged to switch out some sodas for water as they do contain sodium. Pt agreeable. Will also send nutrition supplement 1x/d to ensure that pt is consuming adequate protein to maintain muscle mass.  Nutritionally Relevant Medications: Scheduled Meds:  celecoxib  200 mg Oral Daily   cephALEXin  500 mg Oral Q8H   furosemide  20 mg Intravenous Q12H   insulin aspart  0-5 Units  Subcutaneous QHS   insulin aspart  0-9 Units Subcutaneous TID WC   insulin glargine-yfgn  10 Units Subcutaneous BID   simvastatin  20 mg Oral QHS   PRN Meds: ondansetron  Labs Reviewed: SBG ranges from 119-159 mg/dL over the last 24 hours HgbA1c 6.1% (8/1)  NUTRITION - FOCUSED PHYSICAL EXAM: Defer to in-person assessment  Diet Order:   Diet Order             Diet 2 gram sodium Room service appropriate? Yes; Fluid consistency: Thin  Diet effective now                   EDUCATION NEEDS:  Education needs have been addressed  Skin:  Skin Assessment: Reviewed RN Assessment  Last BM:  8/1  Height:  Ht Readings from Last 1 Encounters:  07/20/21 '6\' 1"'$  (1.854 m)    Weight:  Wt Readings from Last 1 Encounters:  07/22/21 108.8 kg    Ideal Body Weight:  83.6 kg  BMI:  Body mass index is 31.65 kg/m.  Estimated Nutritional Needs:  Kcal:  2000-2200 kcal/d Protein:  100-110g/d Fluid:  2L/d  Ranell Patrick, RD, LDN Clinical Dietitian Pager on [place]

## 2021-07-23 DIAGNOSIS — R0602 Shortness of breath: Secondary | ICD-10-CM | POA: Diagnosis not present

## 2021-07-23 DIAGNOSIS — E119 Type 2 diabetes mellitus without complications: Secondary | ICD-10-CM

## 2021-07-23 DIAGNOSIS — I5033 Acute on chronic diastolic (congestive) heart failure: Secondary | ICD-10-CM | POA: Diagnosis not present

## 2021-07-23 DIAGNOSIS — I482 Chronic atrial fibrillation, unspecified: Secondary | ICD-10-CM | POA: Diagnosis not present

## 2021-07-23 DIAGNOSIS — I060 Rheumatic aortic stenosis: Secondary | ICD-10-CM

## 2021-07-23 LAB — BASIC METABOLIC PANEL
Anion gap: 8 (ref 5–15)
BUN: 18 mg/dL (ref 8–23)
CO2: 32 mmol/L (ref 22–32)
Calcium: 8.6 mg/dL — ABNORMAL LOW (ref 8.9–10.3)
Chloride: 94 mmol/L — ABNORMAL LOW (ref 98–111)
Creatinine, Ser: 0.89 mg/dL (ref 0.61–1.24)
GFR, Estimated: >60 mL/min (ref >60)
Glucose, Bld: 115 mg/dL — ABNORMAL HIGH (ref 70–99)
Potassium: 4.1 mmol/L (ref 3.5–5.1)
Sodium: 134 mmol/L — ABNORMAL LOW (ref 135–145)

## 2021-07-23 LAB — GLUCOSE, CAPILLARY
Glucose-Capillary: 100 mg/dL — ABNORMAL HIGH (ref 70–99)
Glucose-Capillary: 131 mg/dL — ABNORMAL HIGH (ref 70–99)
Glucose-Capillary: 139 mg/dL — ABNORMAL HIGH (ref 70–99)
Glucose-Capillary: 142 mg/dL — ABNORMAL HIGH (ref 70–99)

## 2021-07-23 LAB — LEGIONELLA PNEUMOPHILA SEROGP 1 UR AG: L. pneumophila Serogp 1 Ur Ag: NEGATIVE

## 2021-07-23 LAB — MAGNESIUM: Magnesium: 2 mg/dL (ref 1.7–2.4)

## 2021-07-23 MED ORDER — DOCUSATE SODIUM 100 MG PO CAPS
100.0000 mg | ORAL_CAPSULE | Freq: Two times a day (BID) | ORAL | Status: DC
  Administered 2021-07-23: 100 mg via ORAL
  Filled 2021-07-23 (×2): qty 1

## 2021-07-23 MED ORDER — FUROSEMIDE 10 MG/ML IJ SOLN: 20.0000 mg | Freq: Once | INTRAMUSCULAR | Status: DC

## 2021-07-23 MED ORDER — FUROSEMIDE 10 MG/ML IJ SOLN
20.0000 mg | Freq: Once | INTRAMUSCULAR | Status: AC
  Administered 2021-07-24: 20 mg via INTRAVENOUS
  Filled 2021-07-23: qty 2

## 2021-07-23 MED ORDER — MAGNESIUM SULFATE 2 GM/50ML IV SOLN
2.0000 g | Freq: Once | INTRAVENOUS | Status: AC
  Administered 2021-07-23: 2 g via INTRAVENOUS
  Filled 2021-07-23: qty 50

## 2021-07-23 MED ORDER — METOPROLOL SUCCINATE ER 25 MG PO TB24
25.0000 mg | ORAL_TABLET | Freq: Two times a day (BID) | ORAL | Status: DC
  Administered 2021-07-23 – 2021-07-24 (×2): 25 mg via ORAL
  Filled 2021-07-23 (×2): qty 1

## 2021-07-23 NOTE — Progress Notes (Signed)
Progress Note  Patient Name: William Sawyer Date of Encounter: 07/23/2021  Primary Cardiologist: New to [facility] - consult by End  Subjective   Feels like his breathing is back to baseline. Chronic bilateral ankle edema is stable and at baseline. No chest pain or palpitations. Documented UOP 2.1 L for the past 24 hours and net - 7.8 L for the admission. Weight 111.6--> 108.8 kg.   Inpatient Medications    Scheduled Meds:  allopurinol  300 mg Oral Daily   celecoxib  200 mg Oral Daily   furosemide  20 mg Intravenous Once   insulin aspart  0-5 Units Subcutaneous QHS   insulin aspart  0-9 Units Subcutaneous TID WC   insulin glargine-yfgn  10 Units Subcutaneous BID   metoprolol succinate  25 mg Oral BID   nitrofurantoin (macrocrystal-monohydrate)  100 mg Oral Q12H   Ensure Max Protein  11 oz Oral Daily   rivaroxaban  20 mg Oral Daily   simvastatin  20 mg Oral QHS   Continuous Infusions:   PRN Meds: acetaminophen, albuterol, dextromethorphan-guaiFENesin, ondansetron (ZOFRAN) IV   Vital Signs    Vitals:   07/22/21 1930 07/23/21 0410 07/23/21 0611 07/23/21 0716  BP: 109/64 99/72 108/75 101/67  Pulse: 81 77  73  Resp: '20 20  18  '$ Temp: 98.9 F (37.2 C) 97.7 F (36.5 C)  97.8 F (36.6 C)  TempSrc: Oral     SpO2: 98% 95%  99%  Weight:      Height:        Intake/Output Summary (Last 24 hours) at 07/23/2021 1006 Last data filed at 07/23/2021 0714 Gross per 24 hour  Intake 480 ml  Output 3050 ml  Net -2570 ml   Filed Weights   07/20/21 0945 07/21/21 1418 07/22/21 0600  Weight: 117.9 kg 111.6 kg 108.8 kg    Telemetry    Afib, 70s bpm - Personally Reviewed  ECG    No new tracings - Personally Reviewed  Physical Exam   GEN: No acute distress.   Neck: No JVD. Cardiac: IRIR, II/VI systolic murmur RUSB, no rubs, or gallops.  Respiratory: Diminished breath sounds along the bases bilaterally.  GI: Soft, nontender, non-distended.   MS: 1+ bilateral ankle edema; No  deformity. Neuro:  Alert and oriented x 3; Nonfocal.  Psych: Normal affect.  Labs    Chemistry Recent Labs  Lab 07/20/21 0948 07/21/21 0622 07/22/21 1046 07/23/21 0414  NA 139 138 134* 134*  K 4.0 3.6 4.0 4.1  CL 96* 96* 93* 94*  CO2 31 32 29 32  GLUCOSE 129* 72 232* 115*  BUN '16 15 15 18  '$ CREATININE 0.97 0.89 0.88 0.89  CALCIUM 8.6* 8.6* 8.5* 8.6*  PROT 6.8  --   --   --   ALBUMIN 3.4*  --   --   --   AST 17  --   --   --   ALT 7  --   --   --   ALKPHOS 58  --   --   --   BILITOT 0.8  --   --   --   GFRNONAA >60 >60 >60 >60  ANIONGAP '12 10 12 8     '$ Hematology Recent Labs  Lab 07/20/21 0948 07/21/21 0622  WBC 8.3 7.1  RBC 3.65* 3.38*  HGB 11.3* 10.5*  HCT 34.4* 32.2*  MCV 94.2 95.3  MCH 31.0 31.1  MCHC 32.8 32.6  RDW 15.2 15.1  PLT 262 236  Cardiac EnzymesNo results for input(s): TROPONINI in the last 168 hours. No results for input(s): TROPIPOC in the last 168 hours.   BNP Recent Labs  Lab 07/20/21 0948  BNP 202.9*     DDimer No results for input(s): DDIMER in the last 168 hours.   Radiology      Cardiac Studies   2D echo 07/2021:  1. Left ventricular ejection fraction, by estimation, is >55%. The left  ventricle has normal function. Left ventricular endocardial border not  optimally defined to evaluate regional wall motion. There is mild left  ventricular hypertrophy. Left  ventricular diastolic function could not be evaluated.   2. Right ventricular systolic function is normal. The right ventricular  size is normal.   3. Left atrial size was severely dilated.   4. The mitral valve was not well visualized. Mild mitral valve  regurgitation. No evidence of mitral stenosis. Moderate mitral annular  calcification.   5. The aortic valve was not well visualized. Aortic valve regurgitation  is not visualized. Moderate to severe aortic valve stenosis. Aortic valve  mean gradient measures 38.0 mmHg. Valve area cannot be calculated due to  poor  image quality. Dimension less   index (VTI) is 0.26, consistent with borderline severe aortic stenosis.   Patient Profile     87 y.o. male with history of permanent Afib on Xarelto, HFpEF, moderate to severe aortic stenosis, DM2, HTN, HLD, and gout who we are seeing for HFpEF, Afib, and aortic stenosis  Assessment & Plan    1. Acute on chronic HFpEF: -Volume status is much improved, though he does remain mildly elevated -Renal function remains stable/normal  -Continue IV Lasix 20 mg bid (likely dose of IV Lasix tonight) -At discharge, will likely need Lasix 40 mg (PTA 20 mg) -CHF education -Strict I/O -Daily weights   2. Moderate to severe aortic stenosis: -Outpatient follow up  3. Permanent Afib: -Ventricular rates well controlled  -Decrease Toprol XL to 25 mg bid (some doses have been held) -CHADS2VASc at least 5 (CHF, HTN, age x 2, DM) -Xarelto  4. HTN: -Blood pressure well controlled, to soft at times -Medications as above  5. HLD: -LDL 60 -PTA simvastatin   For questions or updates, please contact [place] Please consult [email or web address] for contact info under Cardiology/STEMI.    Signed, Christell Faith, PA-C [place] Pager: [phone] 07/23/2021, 10:06 AM

## 2021-07-23 NOTE — Care Management Important Message (Signed)
Important Message  Patient Details  Name: William Sawyer MRN: [id number] Date of Birth: Oct 20, 1933   Medicare Important Message Given:  Yes     Dannette Barbara 07/23/2021, 2:15 PM

## 2021-07-23 NOTE — Progress Notes (Signed)
PROGRESS NOTE    William Sawyer  [id number] DOB: 13-Mar-1933 DOA: 07/20/2021 PCP: [name], MD    Brief Narrative:  patient seen in the ER. Daughter at bedside. Came in after having increasing shortness of breath for last two months more so for last couple days. No diagnosis of CHF. Patient has chronic leg edema. He uses electric scooter in the house to get around. Does not ambulate much. Feels a lot better today. Good urine output of 3 L  07/22/2021-feeling better. No cp. Sob not at baseline.  8/4 feeling better.  Decrease shortness of breath.  No chest pain Consultants:  cards  Procedures:   Antimicrobials:      Subjective No dizziness or lightheadedness Objective: Vitals:   07/23/21 0410 07/23/21 0611 07/23/21 0716 07/23/21 1101  BP: 99/72 108/75 101/67 116/70  Pulse: 77  73 65  Resp: '20  18 17  '$ Temp: 97.7 F (36.5 C)  97.8 F (36.6 C) 97.7 F (36.5 C)  TempSrc:      SpO2: 95%  99% 97%  Weight:      Height:        Intake/Output Summary (Last 24 hours) at 07/23/2021 1430 Last data filed at 07/23/2021 1340 Gross per 24 hour  Intake 1440 ml  Output 4225 ml  Net -2785 ml   Filed Weights   07/20/21 0945 07/21/21 1418 07/22/21 0600  Weight: 117.9 kg 111.6 kg 108.8 kg    Examination: Calm, NAD Decreased breath sounds no wheezing Regular S1-S2 no gallops Soft benign positive bowel sounds Mild lower extremity edema Awake and alert and oriented x4 Mood and affect appropriate in current setting     Data Reviewed: I have personally reviewed following labs and imaging studies  CBC: Recent Labs  Lab 07/20/21 0948 07/21/21 0622  WBC 8.3 7.1  NEUTROABS 5.8  --   HGB 11.3* 10.5*  HCT 34.4* 32.2*  MCV 94.2 95.3  PLT 262 [id number]   Basic Metabolic Panel: Recent Labs  Lab 07/20/21 0948 07/21/21 0622 07/22/21 0354 07/22/21 1046 07/23/21 0414  NA 139 138  --  134* 134*  K 4.0 3.6  --  4.0 4.1  CL 96* 96*  --  93* 94*  CO2 31 32  --  29 32  GLUCOSE  129* 72  --  232* 115*  BUN 16 15  --  15 18  CREATININE 0.97 0.89  --  0.88 0.89  CALCIUM 8.6* 8.6*  --  8.5* 8.6*  MG  --  1.1* 2.4  --  2.0   GFR: Estimated Creatinine Clearance: 78.5 mL/min (by C-G formula based on SCr of 0.89 mg/dL). Liver Function Tests: Recent Labs  Lab 07/20/21 0948  AST 17  ALT 7  ALKPHOS 58  BILITOT 0.8  PROT 6.8  ALBUMIN 3.4*   No results for input(s): LIPASE, AMYLASE in the last 168 hours. No results for input(s): AMMONIA in the last 168 hours. Coagulation Profile: No results for input(s): INR, PROTIME in the last 168 hours. Cardiac Enzymes: No results for input(s): CKTOTAL, CKMB, CKMBINDEX, TROPONINI in the last 168 hours. BNP (last 3 results) No results for input(s): PROBNP in the last 8760 hours. HbA1C: No results for input(s): HGBA1C in the last 72 hours.  CBG: Recent Labs  Lab 07/22/21 1120 07/22/21 1643 07/22/21 2145 07/23/21 0716 07/23/21 1100  GLUCAP 261* 151* 136* 100* 131*   Lipid Profile: Recent Labs    07/21/21 0622  CHOL 103  HDL 35*  LDLCALC 60  TRIG 38  CHOLHDL 2.9   Thyroid Function Tests: No results for input(s): TSH, T4TOTAL, FREET4, T3FREE, THYROIDAB in the last 72 hours. Anemia Panel: No results for input(s): VITAMINB12, FOLATE, FERRITIN, TIBC, IRON, RETICCTPCT in the last 72 hours. Sepsis Labs: Recent Labs  Lab 07/20/21 1110 07/20/21 1311 07/20/21 1546  PROCALCITON  --  <0.10  --   LATICACIDVEN 3.1*  --  1.6    Recent Results (from the past 240 hour(s))  Resp Panel by RT-PCR (Flu A&B, Covid) Nasopharyngeal Swab     Status: None   Collection Time: 07/20/21 11:10 AM   Specimen: Nasopharyngeal Swab; Nasopharyngeal(NP) swabs in vial transport medium  Result Value Ref Range Status   SARS Coronavirus 2 by RT PCR NEGATIVE NEGATIVE Final    Comment: (NOTE) SARS-CoV-2 target nucleic acids are NOT DETECTED.  The SARS-CoV-2 RNA is generally detectable in upper respiratory specimens during the acute phase  of infection. The lowest concentration of SARS-CoV-2 viral copies this assay can detect is 138 copies/mL. A negative result does not preclude SARS-Cov-2 infection and should not be used as the sole basis for treatment or other patient management decisions. A negative result may occur with  improper specimen collection/handling, submission of specimen other than nasopharyngeal swab, presence of viral mutation(s) within the areas targeted by this assay, and inadequate number of viral copies(<138 copies/mL). A negative result must be combined with clinical observations, patient history, and epidemiological information. The expected result is Negative.  Fact Sheet for Patients:  EntrepreneurPulse.com.au  Fact Sheet for Healthcare Providers:  IncredibleEmployment.be  This test is no t yet approved or cleared by the [place] FDA and  has been authorized for detection and/or diagnosis of SARS-CoV-2 by FDA under an Emergency Use Authorization (EUA). This EUA will remain  in effect (meaning this test can be used) for the duration of the COVID-19 declaration under Section 564(b)(1) of the Act, 21 U.S.C.section 360bbb-3(b)(1), unless the authorization is terminated  or revoked sooner.       Influenza A by PCR NEGATIVE NEGATIVE Final   Influenza B by PCR NEGATIVE NEGATIVE Final    Comment: (NOTE) The Xpert Xpress SARS-CoV-2/FLU/RSV plus assay is intended as an aid in the diagnosis of influenza from Nasopharyngeal swab specimens and should not be used as a sole basis for treatment. Nasal washings and aspirates are unacceptable for Xpert Xpress SARS-CoV-2/FLU/RSV testing.  Fact Sheet for Patients: EntrepreneurPulse.com.au  Fact Sheet for Healthcare Providers: IncredibleEmployment.be  This test is not yet approved or cleared by the [place] FDA and has been authorized for detection and/or diagnosis of  SARS-CoV-2 by FDA under an Emergency Use Authorization (EUA). This EUA will remain in effect (meaning this test can be used) for the duration of the COVID-19 declaration under Section 564(b)(1) of the Act, 21 U.S.C. section 360bbb-3(b)(1), unless the authorization is terminated or revoked.  Performed at [facility], [address] [place], [place] [address]   Blood culture (routine x 2)     Status: None (Preliminary result)   Collection Time: 07/20/21 11:10 AM   Specimen: BLOOD  Result Value Ref Range Status   Specimen Description BLOOD RT FOREARM  Final   Special Requests   Final    BOTTLES DRAWN AEROBIC AND ANAEROBIC Blood Culture adequate volume   Culture   Final    NO GROWTH 3 DAYS Performed at [facility], [address], [place], [place] [address]    Report Status PENDING  Incomplete  Blood culture (routine  x 2)     Status: None (Preliminary result)   Collection Time: 07/20/21 11:11 AM   Specimen: BLOOD  Result Value Ref Range Status   Specimen Description BLOOD RIGHT FOREARM  Final   Special Requests   Final    Blood Culture results may not be optimal due to an excessive volume of blood received in culture bottles   Culture   Final    NO GROWTH 3 DAYS Performed at [facility], [address] [place], [place] [address]    Report Status PENDING  Incomplete  Urine Culture     Status: Abnormal   Collection Time: 07/20/21  1:26 PM   Specimen: Urine, Random  Result Value Ref Range Status   Specimen Description   Final    URINE, RANDOM Performed at [facility], [address], [place], [place] [address]    Special Requests   Final    NONE Performed at [facility], [place], [place] [address]    Culture >=100,000 COLONIES/mL ESCHERICHIA COLI (A)  Final   Report Status 07/22/2021 FINAL  Final   Organism ID, Bacteria ESCHERICHIA COLI (A)  Final      Susceptibility   Escherichia coli - MIC*     AMPICILLIN >=32 RESISTANT Resistant     CEFAZOLIN 32 INTERMEDIATE Intermediate     CEFEPIME <=0.12 SENSITIVE Sensitive     CEFTRIAXONE 0.5 SENSITIVE Sensitive     CIPROFLOXACIN <=0.25 SENSITIVE Sensitive     GENTAMICIN <=1 SENSITIVE Sensitive     IMIPENEM 0.5 SENSITIVE Sensitive     NITROFURANTOIN <=16 SENSITIVE Sensitive     TRIMETH/SULFA <=20 SENSITIVE Sensitive     AMPICILLIN/SULBACTAM >=32 RESISTANT Resistant     PIP/TAZO 16 SENSITIVE Sensitive     * >=100,000 COLONIES/mL ESCHERICHIA COLI         Radiology Studies: No results found.      Scheduled Meds:  allopurinol  300 mg Oral Daily   celecoxib  200 mg Oral Daily   furosemide  20 mg Intravenous Once   insulin aspart  0-5 Units Subcutaneous QHS   insulin aspart  0-9 Units Subcutaneous TID WC   insulin glargine-yfgn  10 Units Subcutaneous BID   metoprolol succinate  25 mg Oral BID   nitrofurantoin (macrocrystal-monohydrate)  100 mg Oral Q12H   Ensure Max Protein  11 oz Oral Daily   rivaroxaban  20 mg Oral Daily   simvastatin  20 mg Oral QHS   Continuous Infusions:  Assessment & Plan:   Principal Problem:   SOB (shortness of breath) Active Problems:   HTN (hypertension)   HLD (hyperlipidemia)   Gout   Atrial fibrillation, chronic (HCC)   Diabetes mellitus without complication (HCC)   Elevated lactic acid level   UTI (urinary tract infection)   Aortic valve stenosis   Aayan Schroepfer is a 87 y.o. 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    Acute HFpEF-  Improving , but not at baseline from sob stand point Cards following 8/4 continue IV Lasix for today and will switch to p.o. tomorrow  Monitor electrolytes  I's and O's      HTN (hypertension) Stable, on low side 8/4 due to hypotension will decrease metoprolol to 25 mg twice daily  Continue to hold  lisinopril to prevent further hypotension in setting of significant I-S  HLD (hyperlipidemia) Continue Zocor   Gout Continue allopurinol   Atrial fibrillation, chronic (HCC) -Continue Xarelto and metoprolol   Diabetes mellitus without complication (HCC) no [id number] on record.  Patient taking metformin and Lantus at home -Sliding scale insulin Continue Lantus    Elevated lactic acid level: Has resolved.  Lactic acid 3.1 --> 1.6   UTI (urinary tract infection) -IV Rocephin--change to po kelfex       DVT prophylaxis: Xarelto Code Status: DNR Family Communication: None at bedside Disposition Plan:  Status is: Inpatient  Remains inpatient appropriate because:IV treatments appropriate due to intensity of illness or inability to take PO  Dispo: The patient is from: Home              Anticipated d/c is to: Home              Patient currently is not medically stable to d/c.   Difficult to place patient No            LOS: 3 days   Time spent: 35 minutes with more than 50% on [place], MD [facility] Pager [phone]  If 7PM-7AM, please contact night-coverage 07/23/2021, 2:30 PM

## 2021-07-23 NOTE — Progress Notes (Signed)
[facility] for Magnesium Monitoring and Replacement   Recent Labs: Potassium (mmol/L)  Date Value  07/22/2021 4.0   Magnesium (mg/dL)  Date Value  07/23/2021 2.0   Calcium (mg/dL)  Date Value  07/22/2021 8.5 (L)   Albumin (g/dL)  Date Value  07/20/2021 3.4 (L)   Sodium (mmol/L)  Date Value  07/22/2021 134 (L)     Goal of Therapy:  Electrolytes WNL  Plan:  Mg 2.4>2.0, expect Mg to decrease tomorrow since pt is on 20 mg IV lasix. Will order 2 g IV Mg sulfate Follow up Mg with AM labs  Sherilyn Banker ,PharmD Clinical Pharmacist 07/23/2021 7:23 AM

## 2021-07-24 DIAGNOSIS — R0602 Shortness of breath: Secondary | ICD-10-CM | POA: Diagnosis not present

## 2021-07-24 LAB — CBC
HCT: 33.1 % — ABNORMAL LOW (ref 39.0–52.0)
Hemoglobin: 11 g/dL — ABNORMAL LOW (ref 13.0–17.0)
MCH: 30.9 pg (ref 26.0–34.0)
MCHC: 33.2 g/dL (ref 30.0–36.0)
MCV: 93 fL (ref 80.0–100.0)
Platelets: 256 10*3/uL (ref 150–400)
RBC: 3.56 MIL/uL — ABNORMAL LOW (ref 4.22–5.81)
RDW: 14.5 % (ref 11.5–15.5)
WBC: 8.1 10*3/uL (ref 4.0–10.5)
nRBC: 0 % (ref 0.0–0.2)

## 2021-07-24 LAB — POTASSIUM: Potassium: 4 mmol/L (ref 3.5–5.1)

## 2021-07-24 LAB — GLUCOSE, CAPILLARY
Glucose-Capillary: 129 mg/dL — ABNORMAL HIGH (ref 70–99)
Glucose-Capillary: 164 mg/dL — ABNORMAL HIGH (ref 70–99)

## 2021-07-24 MED ORDER — METOPROLOL SUCCINATE ER 25 MG PO TB24: 25.0000 mg | ORAL_TABLET | Freq: Two times a day (BID) | ORAL | 0 refills | Status: DC

## 2021-07-24 MED ORDER — FUROSEMIDE 40 MG PO TABS: 40.0000 mg | ORAL_TABLET | Freq: Every day | ORAL | 0 refills | Status: DC

## 2021-07-24 MED ORDER — NITROFURANTOIN MONOHYD MACRO 100 MG PO CAPS: 100.0000 mg | ORAL_CAPSULE | Freq: Two times a day (BID) | ORAL | 0 refills | Status: AC

## 2021-07-24 MED ORDER — FUROSEMIDE 40 MG PO TABS
40.0000 mg | ORAL_TABLET | Freq: Every day | ORAL | Status: DC
  Administered 2021-07-24: 40 mg via ORAL
  Filled 2021-07-24: qty 1

## 2021-07-24 NOTE — Discharge Summary (Signed)
William Sawyer [id number] DOB: [birth year] DOA: 07/20/2021  PCP: [name], MD  Admit date: 07/20/2021 Discharge date: 07/24/2021  Admitted From: home Disposition:  home  Recommendations for Outpatient Follow-up:  Follow up with PCP in 1 week Please obtain BMP/CBC in one week Please follow up with cardiology in one week  Home Health:yes    Discharge Condition:Stable CODE STATUS:DNR Diet recommendation: Heart Healthy  Brief/Interim Summary: Per [id number] Penry is a 87 y.o. 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  She he was admitted for CHF.  Cardiology was consulted.  Acute HFpEF- Improved. Was was treated with IV Lasix and switch to p.o. Lasix He will need to follow-up with cardiology as outpatient     Moderate to severe aortic stenosis: -Outpatient follow up -He is interested in pursuing TAVR workup      HTN (hypertension) Blood pressure normotensive to low.  His Toprol-XL was decreased. Monitor as outpatient       HLD (hyperlipidemia) Continue statins   Gout Continue allopurinol   Atrial fibrillation, chronic (HCC) -Continue Xarelto and metoprolol   Diabetes mellitus without complication (HCC) no 123456 on record.  Continue home meds   Elevated lactic acid level: Has resolved.      UTI (urinary tract infection) Treated with IV antibiotics switched to p.o. to complete course         Discharge Diagnoses:  Principal Problem:   SOB (shortness of breath) Active Problems:   HTN (hypertension)   HLD (hyperlipidemia)   Gout   Atrial fibrillation, chronic (HCC)   Diabetes mellitus without complication (HCC)   Elevated lactic acid level   UTI (urinary tract infection)   Aortic valve stenosis    Discharge Instructions  Discharge Instructions     Call MD for:  persistant dizziness or light-headedness    Complete by: As directed    Diet - low sodium heart healthy   Complete by: As directed    Discharge instructions   Complete by: As directed    Caution ambulating as you are on blood thinner. F/u with cardiology   Increase activity slowly   Complete by: As directed       Allergies as of 07/24/2021   No Known Allergies      Medication List     STOP taking these medications    celecoxib 200 MG capsule Commonly known as: CELEBREX   lisinopril 20 MG tablet Commonly known as: ZESTRIL   terazosin 10 MG capsule Commonly known as: HYTRIN       TAKE these medications    allopurinol 300 MG tablet Commonly known as: ZYLOPRIM Take 300 mg by mouth daily.   furosemide 40 MG tablet Commonly known as: LASIX Take 1 tablet (40 mg total) by mouth daily. What changed:  medication strength how much to take when to take this   Lantus SoloStar 100 UNIT/ML Solostar Pen Generic drug: insulin glargine Inject 15 Units into the skin in the morning and at bedtime.   metFORMIN 1000 MG tablet Commonly known as: GLUCOPHAGE Take 1,000 mg by mouth 2 (two) times daily.   metoprolol succinate 25 MG 24 hr tablet Commonly known as: TOPROL-XL Take 1 tablet (25 mg total) by mouth 2 (two) times daily. What changed:  medication strength how much to take when to take this   nitrofurantoin (macrocrystal-monohydrate) 100 MG capsule Commonly known as: MACROBID Take 1 capsule (100 mg  total) by mouth every 12 (twelve) hours for 1 day. Start taking on: July 25, 2021   simvastatin 20 MG tablet Commonly known as: ZOCOR Take 20 mg by mouth at bedtime.   Xarelto 20 MG Tabs tablet Generic drug: rivaroxaban Take 20 mg by mouth daily.        Follow-up Information     Minna Merritts, MD Follow up on 07/31/2021.   Specialty: Cardiology Why: @ 11:30am Patient will see the PA.  Your appointment on 07/27/2021 will be canceled. Contact information: [address] [place]  [address] [phone]         [name], MD Follow up on 08/03/2021.   Specialty: Internal Medicine Why: @ 4pm Contact information: [address]   [place] [address] [phone]                No Known Allergies  Consultations: Cardiology   Procedures/Studies: DG Chest 2 View  Result Date: 07/20/2021 CLINICAL DATA:  Shortness of breath EXAM: CHEST - 2 VIEW COMPARISON:  None. FINDINGS: Heart is normal size. Airspace disease in the right lower lobe with small right pleural effusion. Left lung clear. No acute bony abnormality. IMPRESSION: Right lower lobe airspace disease concerning for pneumonia. Small right effusion. Electronically Signed   By: Rolm Baptise M.D.   On: 07/20/2021 10:13   ECHOCARDIOGRAM COMPLETE  Result Date: 07/21/2021    ECHOCARDIOGRAM REPORT   Patient Name:   William Sawyer Date of Exam: 07/21/2021 Medical Rec #:  [id number]     Height:       73.0 in Accession #:    [id number]    Weight:       260.0 lb Date of Birth:  07/11/1933    BSA:          2.405 m Patient Age:    30 years      BP:           100/71 mmHg Patient Gender: M             HR:           78 bpm. Exam Location:  [facility] Procedure: 2D Echo, Color Doppler, Cardiac Doppler and Intracardiac            Opacification Agent Indications:     I50.31 congestive heart failure-Acute Diastolic  History:         Patient has no prior history of Echocardiogram examinations.                  Arrythmias:Atrial Fibrillation; Risk Factors:Hypertension,                  Diabetes and Dyslipidemia.  Sonographer:     Charmayne Sheer RDCS (AE) Referring Phys:  Baker Janus Soledad Gerlach NIU Diagnosing Phys: Nelva Bush MD  Sonographer Comments: Suboptimal parasternal window, Technically difficult study due to poor echo windows and suboptimal subcostal window. Image acquisition challenging due to patient body habitus. IMPRESSIONS  1. Left ventricular ejection fraction, by estimation, is >55%. The left ventricle has normal function. Left  ventricular endocardial border not optimally defined to evaluate regional wall motion. There is mild left ventricular hypertrophy. Left ventricular diastolic function could not be evaluated.  2. Right ventricular systolic function is normal. The right ventricular size is normal.  3. Left atrial size was severely dilated.  4. The mitral valve was not well visualized. Mild mitral valve regurgitation. No evidence of mitral stenosis. Moderate mitral annular calcification.  5. The aortic valve was not well visualized. Aortic valve regurgitation is not visualized. Moderate to severe aortic valve stenosis. Aortic valve mean gradient measures 38.0 mmHg. Valve area cannot be calculated due to poor image quality. Dimension less  index (VTI) is 0.26, consistent with borderline severe aortic stenosis. FINDINGS  Left Ventricle: Left ventricular ejection fraction, by estimation, is >55%. The left ventricle has normal function. Left ventricular endocardial border not optimally defined to evaluate regional wall motion. Definity contrast agent was given IV to delineate the left ventricular endocardial borders. The left ventricular internal cavity size was normal in size. There is mild left ventricular hypertrophy. Left ventricular diastolic function could not be evaluated due to atrial fibrillation. Left ventricular diastolic function could not be evaluated. Right Ventricle: The right ventricular size is normal. Right vetricular wall thickness was not well visualized. Right ventricular systolic function is normal. Left Atrium: Left atrial size was severely dilated. Right Atrium: Right atrial size was not well visualized. Pericardium: Trivial pericardial effusion is present. Mitral Valve: The mitral valve was not well visualized. Moderate mitral annular calcification. Mild mitral valve regurgitation. No evidence of mitral valve stenosis. MV peak gradient, 9.6 mmHg. The mean mitral valve gradient is 4.0 mmHg. Tricuspid Valve: The  tricuspid valve is not well visualized. Aortic Valve: The aortic valve was not well visualized. Aortic valve regurgitation is not visualized. Moderate to severe aortic stenosis is present. Aortic valve mean gradient measures 38.0 mmHg. Aortic valve peak gradient measures 57.5 mmHg. Pulmonic Valve: The pulmonic valve was not well visualized. Aorta: The aortic root was not well visualized. IAS/Shunts: The interatrial septum was not well visualized.  LEFT VENTRICLE PLAX 2D LVIDd:         3.78 cm     Diastology LVIDs:         1.56 cm     LV e' medial:    6.09 cm/s LV PW:         1.17 cm     LV E/e' medial:  23.2 LV IVS:        1.26 cm     LV e' lateral:   6.74 cm/s                            LV E/e' lateral: 21.0  LV Volumes (MOD) LV vol d, MOD A2C: 61.8 ml LV vol d, MOD A4C: 78.8 ml LV vol s, MOD A2C: 28.7 ml LV vol s, MOD A4C: 31.3 ml LV SV MOD A2C:     33.1 ml LV SV MOD A4C:     78.8 ml LV SV MOD BP:      40.6 ml LEFT ATRIUM            Index LA Vol (A4C): 127.0 ml 52.80 ml/m  AORTIC VALVE AV Vmax:           379.00 cm/s AV Vmean:          276.500 cm/s AV VTI:            0.806 m AV Peak Grad:      57.5 mmHg AV Mean Grad:      38.0 mmHg LVOT Vmax:         81.70 cm/s LVOT Vmean:        53.500 cm/s LVOT VTI:          0.176 m LVOT/AV VTI ratio: 0.22 MITRAL VALVE MV Area (PHT): 2.93 cm     SHUNTS  MV Peak grad:  9.6 mmHg     Systemic VTI: 0.18 m MV Mean grad:  4.0 mmHg MV Vmax:       1.55 m/s MV Vmean:      85.7 cm/s MV Decel Time: 259 msec MV E velocity: 141.33 cm/s Nelva Bush MD Electronically signed by Nelva Bush MD Signature Date/Time: 07/21/2021/4:35:54 PM    Final       Subjective: No chest pain or shortness of breath today.  Overall feels great and wants to go home.  Discharge Exam: Vitals:   07/24/21 0732 07/24/21 1106  BP: 125/69 116/73  Pulse: 82 99  Resp: 18 18  Temp: 97.8 F (36.6 C) 97.9 F (36.6 C)  SpO2: 99% 97%   Vitals:   07/24/21 0304 07/24/21 0402 07/24/21 0732 07/24/21 1106   BP: 117/61 108/63 125/69 116/73  Pulse: 83 78 82 99  Resp: (!) '22 20 18 18  '$ Temp: 98.1 F (36.7 C) 97.8 F (36.6 C) 97.8 F (36.6 C) 97.9 F (36.6 C)  TempSrc: Oral Oral    SpO2: 100% 97% 99% 97%  Weight:  107 kg    Height:        General: Pt is alert, awake, not in acute distress Cardiovascular: RRR, S1/S2 +, no gallop Respiratory: CTA bilaterally, no wheezing, no rhonchi Abdominal: Soft, NT, ND, bowel sounds + Extremities: No edema    The results of significant diagnostics from this hospitalization (including imaging, microbiology, ancillary and laboratory) are listed below for reference.     Microbiology: Recent Results (from the past 240 hour(s))  Resp Panel by RT-PCR (Flu A&B, Covid) Nasopharyngeal Swab     Status: None   Collection Time: 07/20/21 11:10 AM   Specimen: Nasopharyngeal Swab; Nasopharyngeal(NP) swabs in vial transport medium  Result Value Ref Range Status   SARS Coronavirus 2 by RT PCR NEGATIVE NEGATIVE Final    Comment: (NOTE) SARS-CoV-2 target nucleic acids are NOT DETECTED.  The SARS-CoV-2 RNA is generally detectable in upper respiratory specimens during the acute phase of infection. The lowest concentration of SARS-CoV-2 viral copies this assay can detect is 138 copies/mL. A negative result does not preclude SARS-Cov-2 infection and should not be used as the sole basis for treatment or other patient management decisions. A negative result may occur with  improper specimen collection/handling, submission of specimen other than nasopharyngeal swab, presence of viral mutation(s) within the areas targeted by this assay, and inadequate number of viral copies(<138 copies/mL). A negative result must be combined with clinical observations, patient history, and epidemiological information. The expected result is Negative.  Fact Sheet for Patients:  EntrepreneurPulse.com.au  Fact Sheet for Healthcare Providers:   IncredibleEmployment.be  This test is no t yet approved or cleared by the [place] FDA and  has been authorized for detection and/or diagnosis of SARS-CoV-2 by FDA under an Emergency Use Authorization (EUA). This EUA will remain  in effect (meaning this test can be used) for the duration of the COVID-19 declaration under Section 564(b)(1) of the Act, 21 U.S.C.section 360bbb-3(b)(1), unless the authorization is terminated  or revoked sooner.       Influenza A by PCR NEGATIVE NEGATIVE Final   Influenza B by PCR NEGATIVE NEGATIVE Final    Comment: (NOTE) The Xpert Xpress SARS-CoV-2/FLU/RSV plus assay is intended as an aid in the diagnosis of influenza from Nasopharyngeal swab specimens and should not be used as a sole basis for treatment. Nasal washings and aspirates are unacceptable for Xpert Xpress SARS-CoV-2/FLU/RSV testing.  Fact Sheet for Patients: EntrepreneurPulse.com.au  Fact Sheet for Healthcare Providers: IncredibleEmployment.be  This test is not yet approved or cleared by the [place] FDA and has been authorized for detection and/or diagnosis of SARS-CoV-2 by FDA under an Emergency Use Authorization (EUA). This EUA will remain in effect (meaning this test can be used) for the duration of the COVID-19 declaration under Section 564(b)(1) of the Act, 21 U.S.C. section 360bbb-3(b)(1), unless the authorization is terminated or revoked.  Performed at [facility], [address] [place], [place] [address]   Blood culture (routine x 2)     Status: None (Preliminary result)   Collection Time: 07/20/21 11:10 AM   Specimen: BLOOD  Result Value Ref Range Status   Specimen Description BLOOD RT FOREARM  Final   Special Requests   Final    BOTTLES DRAWN AEROBIC AND ANAEROBIC Blood Culture adequate volume   Culture   Final    NO GROWTH 4 DAYS Performed at [facility], [address]  [place], [place] [address]    Report Status PENDING  Incomplete  Blood culture (routine x 2)     Status: None (Preliminary result)   Collection Time: 07/20/21 11:11 AM   Specimen: BLOOD  Result Value Ref Range Status   Specimen Description BLOOD RIGHT FOREARM  Final   Special Requests   Final    Blood Culture results may not be optimal due to an excessive volume of blood received in culture bottles   Culture   Final    NO GROWTH 4 DAYS Performed at [facility], [address] [place], [place] [address]    Report Status PENDING  Incomplete  Urine Culture     Status: Abnormal   Collection Time: 07/20/21  1:26 PM   Specimen: Urine, Random  Result Value Ref Range Status   Specimen Description   Final    URINE, RANDOM Performed at [facility], [address] [place], [place] [address]    Special Requests   Final    NONE Performed at [facility], [address] [place],  [address]    Culture >=100,000 COLONIES/mL ESCHERICHIA COLI (A)  Final   Report Status 07/22/2021 FINAL  Final   Organism ID, Bacteria ESCHERICHIA COLI (A)  Final      Susceptibility   Escherichia coli - MIC*    AMPICILLIN >=32 RESISTANT Resistant     CEFAZOLIN 32 INTERMEDIATE Intermediate     CEFEPIME <=0.12 SENSITIVE Sensitive     CEFTRIAXONE 0.5 SENSITIVE Sensitive     CIPROFLOXACIN <=0.25 SENSITIVE Sensitive     GENTAMICIN <=1 SENSITIVE Sensitive     IMIPENEM 0.5 SENSITIVE Sensitive     NITROFURANTOIN <=16 SENSITIVE Sensitive     TRIMETH/SULFA <=20 SENSITIVE Sensitive     AMPICILLIN/SULBACTAM >=32 RESISTANT Resistant     PIP/TAZO 16 SENSITIVE Sensitive     * >=100,000 COLONIES/mL ESCHERICHIA COLI     Labs: BNP (last 3 results) Recent Labs    07/20/21 0948  BNP [id number]*   Basic Metabolic Panel: Recent Labs  Lab 07/20/21 0948 07/21/21 0622 07/22/21 0354 07/22/21 1046 07/23/21 0414 07/24/21 0430  NA 139 138  --  134* 134*  --   K 4.0 3.6  --  4.0 4.1 4.0   CL 96* 96*  --  93* 94*  --   CO2 31 32  --  29 32  --   GLUCOSE 129* 72  --  232* 115*  --  BUN 16 15  --  15 18  --   CREATININE 0.97 0.89  --  0.88 0.89  --   CALCIUM 8.6* 8.6*  --  8.5* 8.6*  --   MG  --  1.1* 2.4  --  2.0  --    Liver Function Tests: Recent Labs  Lab 07/20/21 0948  AST 17  ALT 7  ALKPHOS 58  BILITOT 0.8  PROT 6.8  ALBUMIN 3.4*   No results for input(s): LIPASE, AMYLASE in the last 168 hours. No results for input(s): AMMONIA in the last 168 hours. CBC: Recent Labs  Lab 07/20/21 0948 07/21/21 0622 07/24/21 0430  WBC 8.3 7.1 8.1  NEUTROABS 5.8  --   --   HGB 11.3* 10.5* 11.0*  HCT 34.4* 32.2* 33.1*  MCV 94.2 95.3 93.0  PLT 262 236 256   Cardiac Enzymes: No results for input(s): CKTOTAL, CKMB, CKMBINDEX, TROPONINI in the last 168 hours. BNP: Invalid input(s): POCBNP CBG: Recent Labs  Lab 07/23/21 1100 07/23/21 1636 07/23/21 2124 07/24/21 0731 07/24/21 1104  GLUCAP 131* 139* 142* 129* 164*   D-Dimer No results for input(s): DDIMER in the last 72 hours. Hgb A1c No results for input(s): HGBA1C in the last 72 hours. Lipid Profile No results for input(s): CHOL, HDL, LDLCALC, TRIG, CHOLHDL, LDLDIRECT in the last 72 hours. Thyroid function studies No results for input(s): TSH, T4TOTAL, T3FREE, THYROIDAB in the last 72 hours.  Invalid input(s): FREET3 Anemia work up No results for input(s): VITAMINB12, FOLATE, FERRITIN, TIBC, IRON, RETICCTPCT in the last 72 hours. Urinalysis    Component Value Date/Time   COLORURINE AMBER (A) 07/20/2021 1326   APPEARANCEUR CLOUDY (A) 07/20/2021 1326   LABSPEC 1.018 07/20/2021 1326   PHURINE 6.0 07/20/2021 1326   GLUCOSEU NEGATIVE 07/20/2021 1326   HGBUR NEGATIVE 07/20/2021 1326   [place] 07/20/2021 1326   [place] 07/20/2021 1326   PROTEINUR 30 (A) 07/20/2021 1326   NITRITE POSITIVE (A) 07/20/2021 1326   LEUKOCYTESUR LARGE (A) 07/20/2021 1326   Sepsis Labs Invalid  input(s): PROCALCITONIN,  WBC,  LACTICIDVEN Microbiology Recent Results (from the past 240 hour(s))  Resp Panel by RT-PCR (Flu A&B, Covid) Nasopharyngeal Swab     Status: None   Collection Time: 07/20/21 11:10 AM   Specimen: Nasopharyngeal Swab; Nasopharyngeal(NP) swabs in vial transport medium  Result Value Ref Range Status   SARS Coronavirus 2 by RT PCR NEGATIVE NEGATIVE Final    Comment: (NOTE) SARS-CoV-2 target nucleic acids are NOT DETECTED.  The SARS-CoV-2 RNA is generally detectable in upper respiratory specimens during the acute phase of infection. The lowest concentration of SARS-CoV-2 viral copies this assay can detect is 138 copies/mL. A negative result does not preclude SARS-Cov-2 infection and should not be used as the sole basis for treatment or other patient management decisions. A negative result may occur with  improper specimen collection/handling, submission of specimen other than nasopharyngeal swab, presence of viral mutation(s) within the areas targeted by this assay, and inadequate number of viral copies(<138 copies/mL). A negative result must be combined with clinical observations, patient history, and epidemiological information. The expected result is Negative.  Fact Sheet for Patients:  EntrepreneurPulse.com.au  Fact Sheet for Healthcare Providers:  IncredibleEmployment.be  This test is no t yet approved or cleared by the [place] FDA and  has been authorized for detection and/or diagnosis of SARS-CoV-2 by FDA under an Emergency Use Authorization (EUA). This EUA will remain  in effect (meaning this test can be  used) for the duration of the COVID-19 declaration under Section 564(b)(1) of the Act, 21 U.S.C.section 360bbb-3(b)(1), unless the authorization is terminated  or revoked sooner.       Influenza A by PCR NEGATIVE NEGATIVE Final   Influenza B by PCR NEGATIVE NEGATIVE Final    Comment: (NOTE) The Xpert  Xpress SARS-CoV-2/FLU/RSV plus assay is intended as an aid in the diagnosis of influenza from Nasopharyngeal swab specimens and should not be used as a sole basis for treatment. Nasal washings and aspirates are unacceptable for Xpert Xpress SARS-CoV-2/FLU/RSV testing.  Fact Sheet for Patients: EntrepreneurPulse.com.au  Fact Sheet for Healthcare Providers: IncredibleEmployment.be  This test is not yet approved or cleared by the [place] FDA and has been authorized for detection and/or diagnosis of SARS-CoV-2 by FDA under an Emergency Use Authorization (EUA). This EUA will remain in effect (meaning this test can be used) for the duration of the COVID-19 declaration under Section 564(b)(1) of the Act, 21 U.S.C. section 360bbb-3(b)(1), unless the authorization is terminated or revoked.  Performed at [facility], [address] [place], [place] [address]   Blood culture (routine x 2)     Status: None (Preliminary result)   Collection Time: 07/20/21 11:10 AM   Specimen: BLOOD  Result Value Ref Range Status   Specimen Description BLOOD RT FOREARM  Final   Special Requests   Final    BOTTLES DRAWN AEROBIC AND ANAEROBIC Blood Culture adequate volume   Culture   Final    NO GROWTH 4 DAYS Performed at [facility], [address], [place], [place] [address]    Report Status PENDING  Incomplete  Blood culture (routine x 2)     Status: None (Preliminary result)   Collection Time: 07/20/21 11:11 AM   Specimen: BLOOD  Result Value Ref Range Status   Specimen Description BLOOD RIGHT FOREARM  Final   Special Requests   Final    Blood Culture results may not be optimal due to an excessive volume of blood received in culture bottles   Culture   Final    NO GROWTH 4 DAYS Performed at [facility], [address] [place], [place] [address]    Report Status PENDING  Incomplete  Urine Culture     Status: Abnormal    Collection Time: 07/20/21  1:26 PM   Specimen: Urine, Random  Result Value Ref Range Status   Specimen Description   Final    URINE, RANDOM Performed at [facility], [address], [place], [place] [address]    Special Requests   Final    NONE Performed at [facility], [address] [place], [place] [address]    Culture >=100,000 COLONIES/mL ESCHERICHIA COLI (A)  Final   Report Status 07/22/2021 FINAL  Final   Organism ID, Bacteria ESCHERICHIA COLI (A)  Final      Susceptibility   Escherichia coli - MIC*    AMPICILLIN >=32 RESISTANT Resistant     CEFAZOLIN 32 INTERMEDIATE Intermediate     CEFEPIME <=0.12 SENSITIVE Sensitive     CEFTRIAXONE 0.5 SENSITIVE Sensitive     CIPROFLOXACIN <=0.25 SENSITIVE Sensitive     GENTAMICIN <=1 SENSITIVE Sensitive     IMIPENEM 0.5 SENSITIVE Sensitive     NITROFURANTOIN <=16 SENSITIVE Sensitive     TRIMETH/SULFA <=20 SENSITIVE Sensitive     AMPICILLIN/SULBACTAM >=32 RESISTANT Resistant     PIP/TAZO 16 SENSITIVE Sensitive     * >=100,000 COLONIES/mL ESCHERICHIA COLI  Time coordinating discharge: Over 30 minutes  SIGNED:   Nolberto Hanlon, MD  [facility] 07/24/2021, 1:21 PM Pager   If 7PM-7AM, please contact night-coverage [email or web address] Password [email or web address]

## 2021-07-24 NOTE — Progress Notes (Signed)
Pt called out stating "I'm having one of those spells and I can't catch my breath."  Upon entering room, pt was lying supine and respirations were about 24/minute.  VS checked and sats were 100% on RA.  Pt stated it started "about an hour ago" but he didn't call out because he thought it would pass.  Pt assisted to sitting up on the edge of the bed.  Lungs clear, no wheezing or ronchi noted.  Fan provided for pt.  Pt stated it felt like his throat was tight.  After a few minutes of sitting up, pt stated his breathing was easing off, symptoms resolved and he thought he could go back to sleep.  Pt remains alert and oriented and without any further symptoms.   07/24/21 0304  Vitals  Temp 98.1 F (36.7 C)  Temp Source Oral  BP 117/61  MAP (mmHg) 68  BP Location Left Arm  BP Method Automatic  Patient Position (if appropriate) Lying  Pulse Rate 83  ECG Heart Rate 68  Resp (!) 22  Level of Consciousness  Level of Consciousness Alert  MEWS COLOR  MEWS Score Color Green  Oxygen Therapy  SpO2 100 %  O2 Device Room Air  Pain Assessment  Pain Scale 0-10  Pain Score 0  PCA/Epidural/Spinal Assessment  Respiratory Pattern Regular;Symmetrical;Orthopnea  MEWS Score  MEWS Temp 0  MEWS Systolic 0  MEWS Pulse 0  MEWS RR 1  MEWS LOC 0  MEWS Score 1   Ayesha Mohair BSN RN [facility]

## 2021-07-24 NOTE — Progress Notes (Signed)
Progress Note  Patient Name: William Sawyer Date of Encounter: 07/24/2021  Primary Cardiologist: New to [facility] - consult by End  Subjective   Had an episode of neck tightness around 11 PM last night that improved with a fan. He feels like this was related to anxiety. Feels like his breathing is back to baseline. Chronic bilateral ankle edema is stable and at baseline. No chest pain or palpitations. Documented UOP 2.8 L for the past 24 hours and net - 10.6 L for the admission. Weight 108.8-->107 kg.   Inpatient Medications    Scheduled Meds:  allopurinol  300 mg Oral Daily   celecoxib  200 mg Oral Daily   docusate sodium  100 mg Oral BID   insulin aspart  0-5 Units Subcutaneous QHS   insulin aspart  0-9 Units Subcutaneous TID WC   insulin glargine-yfgn  10 Units Subcutaneous BID   metoprolol succinate  25 mg Oral BID   nitrofurantoin (macrocrystal-monohydrate)  100 mg Oral Q12H   Ensure Max Protein  11 oz Oral Daily   rivaroxaban  20 mg Oral Daily   simvastatin  20 mg Oral QHS   Continuous Infusions:   PRN Meds: acetaminophen, albuterol, dextromethorphan-guaiFENesin, ondansetron (ZOFRAN) IV   Vital Signs    Vitals:   07/23/21 1942 07/24/21 0304 07/24/21 0402 07/24/21 0732  BP: 106/60 117/61 108/63 125/69  Pulse: 70 83 78 82  Resp: 18 (!) '22 20 18  '$ Temp: 97.9 F (36.6 C) 98.1 F (36.7 C) 97.8 F (36.6 C) 97.8 F (36.6 C)  TempSrc: Oral Oral Oral   SpO2: 98% 100% 97% 99%  Weight:   107 kg   Height:        Intake/Output Summary (Last 24 hours) at 07/24/2021 0748 Last data filed at 07/24/2021 0729 Gross per 24 hour  Intake 1440 ml  Output 4175 ml  Net -2735 ml    Filed Weights   07/21/21 1418 07/22/21 0600 07/24/21 0402  Weight: 111.6 kg 108.8 kg 107 kg    Telemetry    Afib, 70s bpm overall with a brief episode of RVR around 8 PM - Personally Reviewed  ECG    No new tracings - Personally Reviewed  Physical Exam   GEN: No acute distress.   Neck: No  JVD. Cardiac: IRIR, II/VI systolic murmur RUSB, no rubs, or gallops.  Respiratory: Diminished breath sounds along the bases bilaterally.  GI: Soft, nontender, non-distended.   MS: 1+ bilateral ankle edema; No deformity. Neuro:  Alert and oriented x 3; Nonfocal.  Psych: Normal affect.  Labs    Chemistry Recent Labs  Lab 07/20/21 0948 07/21/21 0622 07/22/21 1046 07/23/21 0414 07/24/21 0430  NA 139 138 134* 134*  --   K 4.0 3.6 4.0 4.1 4.0  CL 96* 96* 93* 94*  --   CO2 31 32 29 32  --   GLUCOSE 129* 72 232* 115*  --   BUN '16 15 15 18  '$ --   CREATININE 0.97 0.89 0.88 0.89  --   CALCIUM 8.6* 8.6* 8.5* 8.6*  --   PROT 6.8  --   --   --   --   ALBUMIN 3.4*  --   --   --   --   AST 17  --   --   --   --   ALT 7  --   --   --   --   ALKPHOS 58  --   --   --   --  BILITOT 0.8  --   --   --   --   GFRNONAA >60 >60 >60 >60  --   ANIONGAP '12 10 12 8  '$ --       Hematology Recent Labs  Lab 07/20/21 0948 07/21/21 0622 07/24/21 0430  WBC 8.3 7.1 8.1  RBC 3.65* 3.38* 3.56*  HGB 11.3* 10.5* 11.0*  HCT 34.4* 32.2* 33.1*  MCV 94.2 95.3 93.0  MCH 31.0 31.1 30.9  MCHC 32.8 32.6 33.2  RDW 15.2 15.1 14.5  PLT 262 236 256     Cardiac EnzymesNo results for input(s): TROPONINI in the last 168 hours. No results for input(s): TROPIPOC in the last 168 hours.   BNP Recent Labs  Lab 07/20/21 0948  BNP 202.9*      DDimer No results for input(s): DDIMER in the last 168 hours.   Radiology      Cardiac Studies   2D echo 07/2021:  1. Left ventricular ejection fraction, by estimation, is >55%. The left  ventricle has normal function. Left ventricular endocardial border not  optimally defined to evaluate regional wall motion. There is mild left  ventricular hypertrophy. Left  ventricular diastolic function could not be evaluated.   2. Right ventricular systolic function is normal. The right ventricular  size is normal.   3. Left atrial size was severely dilated.   4. The  mitral valve was not well visualized. Mild mitral valve  regurgitation. No evidence of mitral stenosis. Moderate mitral annular  calcification.   5. The aortic valve was not well visualized. Aortic valve regurgitation  is not visualized. Moderate to severe aortic valve stenosis. Aortic valve  mean gradient measures 38.0 mmHg. Valve area cannot be calculated due to  poor image quality. Dimension less   index (VTI) is 0.26, consistent with borderline severe aortic stenosis.   Patient Profile     87 y.o. male with history of permanent Afib on Xarelto, HFpEF, moderate to severe aortic stenosis, DM2, HTN, HLD, and gout who we are seeing for HFpEF, Afib, and aortic stenosis  Assessment & Plan    1. Acute on chronic HFpEF: -Volume status is much improved -Renal function remains stable on last check -Transition from IV Lasix 20 mg bid to oral Lasix 40 mg daily (PTA Lasix 20 mg daily) -CHF education -Strict I/O -Daily weights   2. Moderate to severe aortic stenosis: -Outpatient follow up -He is interested in pursuing TAVR workup   3. Permanent Afib: -Ventricular rates well controlled  -Continue decreased dose of Toprol XL to 25 mg bid (some doses have been held with soft BP) -CHADS2VASc at least 5 (CHF, HTN, age x 2, DM) -Xarelto -CrCl 90.8  4. HTN: -Blood pressure well controlled -Medications as above  5. HLD: -LDL 60 -PTA simvastatin   For questions or updates, please contact [place] Please consult [email or web address] for contact info under Cardiology/STEMI.    Signed, Christell Faith, PA-C [place] Pager: [phone] 07/24/2021, 7:48 AM

## 2021-07-24 NOTE — Progress Notes (Signed)
William Sawyer to be D/C'd Home per MD order.  Discussed prescriptions and follow up appointments with the patient. Prescriptions given to patient, medication list explained in detail. Pt verbalized understanding.  Allergies as of 07/24/2021   No Known Allergies      Medication List     STOP taking these medications    celecoxib 200 MG capsule Commonly known as: CELEBREX   lisinopril 20 MG tablet Commonly known as: ZESTRIL   terazosin 10 MG capsule Commonly known as: HYTRIN       TAKE these medications    allopurinol 300 MG tablet Commonly known as: ZYLOPRIM Take 300 mg by mouth daily.   furosemide 40 MG tablet Commonly known as: LASIX Take 1 tablet (40 mg total) by mouth daily. What changed:  medication strength how much to take when to take this   Lantus SoloStar 100 UNIT/ML Solostar Pen Generic drug: insulin glargine Inject 15 Units into the skin in the morning and at bedtime.   metFORMIN 1000 MG tablet Commonly known as: GLUCOPHAGE Take 1,000 mg by mouth 2 (two) times daily.   metoprolol succinate 25 MG 24 hr tablet Commonly known as: TOPROL-XL Take 1 tablet (25 mg total) by mouth 2 (two) times daily. What changed:  medication strength how much to take when to take this   nitrofurantoin (macrocrystal-monohydrate) 100 MG capsule Commonly known as: MACROBID Take 1 capsule (100 mg total) by mouth every 12 (twelve) hours for 1 day. Start taking on: July 25, 2021   simvastatin 20 MG tablet Commonly known as: ZOCOR Take 20 mg by mouth at bedtime.   Xarelto 20 MG Tabs tablet Generic drug: rivaroxaban Take 20 mg by mouth daily.        Vitals:   07/24/21 0732 07/24/21 1106  BP: 125/69 116/73  Pulse: 82 99  Resp: 18 18  Temp: 97.8 F (36.6 C) 97.9 F (36.6 C)  SpO2: 99% 97%    Tele box removed and returned. Skin clean, dry and intact without evidence of skin break down, no evidence of skin tears noted. IV catheter discontinued intact. Site  without signs and symptoms of complications. Dressing and pressure applied. Pt denies pain at this time. No complaints noted.  An After Visit Summary was printed and given to the patient. Patient escorted via [place], and D/C home via private auto.  [name]

## 2021-07-24 NOTE — Consult Note (Addendum)
   Heart Failure Nurse Navigator Note  HfpEF >55%.  Moderate to severe aortic stenosis.  He presented to the emergency room with complaints of worsening shortness of breath and lower extremity edema.  Comorbidities:  Hypertension Hyperlipidemia Diabetes Gout Permanent atrial fibrillation Medications:  Furosemide 40 mg daily Metoprolol succinate 25 mg 2 times a day Xarelto 20 mg daily Simvastatin 20 mg at bedtime  Labs:  Labs performed on 07/23/2021-sodium 134, potassium 4.1, chloride 94, CO2 32, BUN 18, creatinine 0.89. Weight was 107 kg Intake 1440 mL Output 4325 mL   Initial meeting with patient today.  Discussed the importance of weighing daily and reporting 2 pound weight gain overnight or 5 pounds within a week to his physician, also discussed symptoms to report to physician.  He states that he lives at home with his wife of 14 years and she does most of the cooking.  He states that he likes to use salt, discussed the reasoning behind removing the saltshaker from the table and eating more low-sodium foods.  Also discussed eating in a restaurant.  He states that he is done some reach search on the Internet about the TAVR, says that he would rather have 3 good quality years over 5 years of not feeling well.  He was given the living with heart failure teaching booklet along with his own magnet and information on low-sodium.  He voices that he is also concerned about what he thinks he is having his panic attack, like the one he had this morning that was relieved with having a fan blowing in his face.  He states that this is happened at home.  He asked if the hospitalist could order him something for that.  Spoke with Dr. Kurtis Bushman and she request that he follow-up with family doctor.  Discussed this with the patient and he voices understanding.  Patient's nurse was also made aware.  Discussed the outpatient heart failure clinic he has an appointment for August 17 at 3  PM.  Pricilla Riffle RN [facility]

## 2021-07-25 ENCOUNTER — Emergency Department
Admission: EM | Admit: 2021-07-25 | Discharge: 2021-07-25 | Disposition: A | Discharge status: Home / Self Care | Payer: Medicare Other | Attending: Emergency Medicine | Admitting: Emergency Medicine

## 2021-07-25 ENCOUNTER — Other Ambulatory Visit: Payer: Self-pay

## 2021-07-25 ENCOUNTER — Emergency Department: Payer: Medicare Other

## 2021-07-25 DIAGNOSIS — Z96653 Presence of artificial knee joint, bilateral: Secondary | ICD-10-CM | POA: Insufficient documentation

## 2021-07-25 DIAGNOSIS — Z79899 Other long term (current) drug therapy: Secondary | ICD-10-CM | POA: Diagnosis not present

## 2021-07-25 DIAGNOSIS — Z794 Long term (current) use of insulin: Secondary | ICD-10-CM | POA: Insufficient documentation

## 2021-07-25 DIAGNOSIS — I10 Essential (primary) hypertension: Secondary | ICD-10-CM | POA: Insufficient documentation

## 2021-07-25 DIAGNOSIS — Z7984 Long term (current) use of oral hypoglycemic drugs: Secondary | ICD-10-CM | POA: Insufficient documentation

## 2021-07-25 DIAGNOSIS — Z8679 Personal history of other diseases of the circulatory system: Secondary | ICD-10-CM

## 2021-07-25 DIAGNOSIS — Z7901 Long term (current) use of anticoagulants: Secondary | ICD-10-CM | POA: Diagnosis not present

## 2021-07-25 DIAGNOSIS — R072 Precordial pain: Secondary | ICD-10-CM | POA: Diagnosis present

## 2021-07-25 DIAGNOSIS — E119 Type 2 diabetes mellitus without complications: Secondary | ICD-10-CM | POA: Diagnosis not present

## 2021-07-25 DIAGNOSIS — R0789 Other chest pain: Secondary | ICD-10-CM

## 2021-07-25 DIAGNOSIS — I350 Nonrheumatic aortic (valve) stenosis: Secondary | ICD-10-CM | POA: Insufficient documentation

## 2021-07-25 DIAGNOSIS — I482 Chronic atrial fibrillation, unspecified: Secondary | ICD-10-CM | POA: Diagnosis not present

## 2021-07-25 LAB — CBC
HCT: 36.6 % — ABNORMAL LOW (ref 39.0–52.0)
Hemoglobin: 12.4 g/dL — ABNORMAL LOW (ref 13.0–17.0)
MCH: 31.6 pg (ref 26.0–34.0)
MCHC: 33.9 g/dL (ref 30.0–36.0)
MCV: 93.1 fL (ref 80.0–100.0)
Platelets: 283 10*3/uL (ref 150–400)
RBC: 3.93 MIL/uL — ABNORMAL LOW (ref 4.22–5.81)
RDW: 14.6 % (ref 11.5–15.5)
WBC: 10.7 10*3/uL — ABNORMAL HIGH (ref 4.0–10.5)
nRBC: 0 % (ref 0.0–0.2)

## 2021-07-25 LAB — COMPREHENSIVE METABOLIC PANEL
ALT: 13 U/L (ref 0–44)
AST: 29 U/L (ref 15–41)
Albumin: 3.7 g/dL (ref 3.5–5.0)
Alkaline Phosphatase: 68 U/L (ref 38–126)
Anion gap: 13 (ref 5–15)
BUN: 21 mg/dL (ref 8–23)
CO2: 25 mmol/L (ref 22–32)
Calcium: 9.4 mg/dL (ref 8.9–10.3)
Chloride: 97 mmol/L — ABNORMAL LOW (ref 98–111)
Creatinine, Ser: 0.84 mg/dL (ref 0.61–1.24)
GFR, Estimated: >60 mL/min (ref >60)
Glucose, Bld: 84 mg/dL (ref 70–99)
Potassium: 4.3 mmol/L (ref 3.5–5.1)
Sodium: 135 mmol/L (ref 135–145)
Total Bilirubin: 0.9 mg/dL (ref 0.3–1.2)
Total Protein: 7.6 g/dL (ref 6.5–8.1)

## 2021-07-25 LAB — CULTURE, BLOOD (ROUTINE X 2)
Culture: NO GROWTH
Culture: NO GROWTH
Special Requests: ADEQUATE

## 2021-07-25 LAB — TROPONIN I (HIGH SENSITIVITY)
Troponin I (High Sensitivity): 20 ng/L — ABNORMAL HIGH (ref <18)
Troponin I (High Sensitivity): 18 ng/L — ABNORMAL HIGH (ref <18)

## 2021-07-25 LAB — PROTIME-INR
INR: 2.8 — ABNORMAL HIGH (ref 0.8–1.2)
Prothrombin Time: 29.6 seconds — ABNORMAL HIGH (ref 11.4–15.2)

## 2021-07-25 NOTE — ED Triage Notes (Signed)
Pt here with son who states pt has had 4 episodes of SHOB and chest tightness today- they states cardiologist has been considering replacing his aortic valve- pt states he "just feels bad"- pt was seen here last week for same

## 2021-07-25 NOTE — ED Provider Notes (Signed)
[facility] Emergency Department Provider Note   ____________________________________________   Event Date/Time   First MD Initiated Contact with Patient 07/25/21 1854     (approximate)  I have reviewed the triage vital signs and the nursing notes.   HISTORY  Chief Complaint Shortness of Breath and Chest Pain    HPI William Sawyer is a 87 y.o. male with the below stated past medical history presents for chest pain LOCATION: Upper sternal DURATION: 3 weeks TIMING: Intermittent but more frequent over the last 24 hours SEVERITY: 4/10 QUALITY: "Like a lump in my throat" CONTEXT: States that this occurs more frequently with exertion but can happen at rest MODIFYING FACTORS: Worsened with exertion and usually relieved with rest ASSOCIATED SYMPTOMS: Shortness of breath   Per medical record review, patient was recently seen and told that he has severe aortic stenosis and would likely need a TAVR          Past Medical History:  Diagnosis Date   Atrial fibrillation, chronic (HCC)    Diabetes mellitus without complication ([place])    Gout    HLD (hyperlipidemia)    HTN (hypertension)     Patient Active Problem List   Diagnosis Date Noted   Aortic valve stenosis    SOB (shortness of breath) 07/20/2021   HTN (hypertension) 07/20/2021   HLD (hyperlipidemia) 07/20/2021   Gout 07/20/2021   Atrial fibrillation, chronic ([place]) 07/20/2021   UTI (urinary tract infection) 07/20/2021   Diabetes mellitus without complication (HCC)    Elevated lactic acid level     Past Surgical History:  Procedure Laterality Date   Left hip replacement     REPLACEMENT TOTAL KNEE BILATERAL Bilateral     Prior to Admission medications   Medication Sig Start Date End Date Taking? Authorizing Provider  allopurinol (ZYLOPRIM) 300 MG tablet Take 300 mg by mouth daily. 05/15/21   [provider]  furosemide (LASIX) 40 MG tablet Take 1 tablet (40 mg total) by mouth  daily. 07/24/21 08/23/21  Nolberto Hanlon, MD  LANTUS SOLOSTAR 100 UNIT/ML Solostar Pen Inject 15 Units into the skin in the morning and at bedtime. 07/06/21   [provider]  metFORMIN (GLUCOPHAGE) 1000 MG tablet Take 1,000 mg by mouth 2 (two) times daily. 05/16/21   [provider]  metoprolol succinate (TOPROL-XL) 25 MG 24 hr tablet Take 1 tablet (25 mg total) by mouth 2 (two) times daily. 07/24/21 08/23/21  Nolberto Hanlon, MD  nitrofurantoin, macrocrystal-monohydrate, (MACROBID) 100 MG capsule Take 1 capsule (100 mg total) by mouth every 12 (twelve) hours for 1 day. 07/25/21 07/26/21  Nolberto Hanlon, MD  simvastatin (ZOCOR) 20 MG tablet Take 20 mg by mouth at bedtime. 06/04/21   [provider]  XARELTO 20 MG TABS tablet Take 20 mg by mouth daily. 06/04/21   [provider]    Allergies Patient has no known allergies.  Family History  Problem Relation Age of Onset   Breast cancer Mother    Pancreatic cancer Father     Social History Social History   Tobacco Use   Smoking status: Never   Smokeless tobacco: Never  Substance Use Topics   Alcohol use: Not Currently   Drug use: Never    Review of Systems Constitutional: No fever/chills Eyes: No visual changes. ENT: No sore throat. Cardiovascular: Endorses chest pain. Respiratory: Endorses shortness of breath. Gastrointestinal: No abdominal pain.  No nausea, no vomiting.  No diarrhea. Genitourinary: Negative for dysuria. Musculoskeletal: Negative for acute  arthralgias Skin: Negative for rash. Neurological: Negative for headaches, weakness/numbness/paresthesias in any extremity Psychiatric: Negative for suicidal ideation/homicidal ideation   ____________________________________________   PHYSICAL EXAM:  VITAL SIGNS: ED Triage Vitals  Enc Vitals Group     BP 07/25/21 1536 115/77     Pulse Rate 07/25/21 1536 96     Resp 07/25/21 1536 18     Temp 07/25/21 1536 97.8 F (36.6 C)     Temp Source 07/25/21  1536 Oral     SpO2 07/25/21 1536 96 %     Weight 07/25/21 1533 246 lb (111.6 kg)     Height 07/25/21 1533 '6\' 1"'$  (1.854 m)     Head Circumference --      Peak Flow --      Pain Score 07/25/21 1533 0     Pain Loc --      Pain Edu? --      Excl. in [place]? --    Constitutional: Alert and oriented. Well appearing and in no acute distress. Eyes: Conjunctivae are normal. PERRL. Head: Atraumatic. Nose: No congestion/rhinnorhea. Mouth/Throat: Mucous membranes are moist. Neck: No stridor Cardiovascular: Grossly normal heart sounds.  Good peripheral circulation. Respiratory: Normal respiratory effort.  No retractions. Gastrointestinal: Soft and nontender. No distention. Musculoskeletal: No obvious deformities Neurologic:  Normal speech and language. No gross focal neurologic deficits are appreciated. Skin:  Skin is warm and dry. No rash noted. Psychiatric: Mood and affect are normal. Speech and behavior are normal.  ____________________________________________   LABS (all labs ordered are listed, but only abnormal results are displayed)  Labs Reviewed  CBC - Abnormal; Notable for the following components:      Result Value   WBC 10.7 (*)    RBC 3.93 (*)    Hemoglobin 12.4 (*)    HCT 36.6 (*)    All other components within normal limits  PROTIME-INR - Abnormal; Notable for the following components:   Prothrombin Time 29.6 (*)    INR 2.8 (*)    All other components within normal limits  COMPREHENSIVE METABOLIC PANEL - Abnormal; Notable for the following components:   Chloride 97 (*)    All other components within normal limits  TROPONIN I (HIGH SENSITIVITY) - Abnormal; Notable for the following components:   Troponin I (High Sensitivity) 20 (*)    All other components within normal limits  TROPONIN I (HIGH SENSITIVITY) - Abnormal; Notable for the following components:   Troponin I (High Sensitivity) 18 (*)    All other components within normal limits    ____________________________________________  EKG  ED ECG REPORT I, Naaman Plummer, the attending physician, personally viewed and interpreted this ECG.  Date: 07/25/2021 EKG Time: 1536 Rate: 101 Rhythm: Atrial fibrillation QRS Axis: normal Intervals: normal ST/T Wave abnormalities: normal Narrative Interpretation: Atrial fibrillation.  No evidence of acute ischemia  ____________________________________________  RADIOLOGY  ED MD interpretation: 2 view chest x-ray shows persistent right pleural effusion  Official radiology report(s): DG Chest 2 View  Result Date: 07/25/2021 CLINICAL DATA:  Shortness of breath.  Chest tightness. EXAM: CHEST - 2 VIEW COMPARISON:  April 1 22 FINDINGS: Heart size is normal. Persistent small RIGHT pleural effusion appears similar. There is minimal RIGHT basilar opacity consistent with atelectasis or infiltrate and also similar compared to prior. Otherwise the lungs are clear. No pulmonary edema. IMPRESSION: Persistent RIGHT pleural effusion and RIGHT basilar opacity consistent with atelectasis or infiltrate. Electronically Signed   By: Nolon Nations M.D.   On: 07/25/2021 16:10  ____________________________________________   PROCEDURES  Procedure(s) performed (including Critical Care):  .1-3 Lead EKG Interpretation  Date/Time: 07/25/2021 10:36 PM Performed by: Naaman Plummer, MD Authorized by: Naaman Plummer, MD     Interpretation: abnormal     ECG rate:  95   ECG rate assessment: normal     Rhythm: atrial fibrillation     Ectopy: none     Conduction: normal     ____________________________________________   INITIAL IMPRESSION / ASSESSMENT AND PLAN / ED COURSE  As part of my medical decision making, I reviewed the following data within the electronic medical record, if available:  Nursing notes reviewed and incorporated, Labs reviewed, EKG interpreted, Old chart reviewed, Radiograph reviewed and Notes from prior ED visits  reviewed and incorporated        Workup: ECG, CXR, CBC, BMP, Troponin Findings: ECG: No overt evidence of STEMI. No evidence of Brugadas sign, delta wave, epsilon wave, significantly prolonged QTc, or malignant arrhythmia HS Troponin: Negative x2 Other Labs unremarkable for emergent problems. CXR: Without PTX, PNA, or widened mediastinum Last Stress Test:  2012 Last Heart Catheterization:  2013 HEART Score: 4  Given History, Exam, and Workup I have low suspicion for ACS, Pneumothorax, Pneumonia, Pulmonary Embolus, Tamponade, Aortic Dissection or other emergent problem as a cause for this presentation.   Reassesment: Prior to discharge patients pain was controlled and they were well appearing.  Disposition:  Discharge. Strict return precautions discussed with patient with full understanding. Advised patient to follow up promptly with primary care provider      ____________________________________________   FINAL CLINICAL IMPRESSION(S) / ED DIAGNOSES  Final diagnoses:  Other chest pain  History of aortic stenosis     ED Discharge Orders     None        Note:  This document was prepared using Dragon voice recognition software and may include unintentional dictation errors.    Naaman Plummer, MD 07/25/21 2236

## 2021-07-27 ENCOUNTER — Ambulatory Visit: Payer: Self-pay | Admitting: Cardiology

## 2021-07-30 NOTE — Progress Notes (Signed)
Cardiology Office Note:    Date:  07/31/2021   ID:  William Sawyer, DOB 1933-08-26, MRN [id number]  PCP:  [name], MD  [facility] Cardiologist:  Dr. [facility] Electrophysiologist:  None   Referring MD: [name], MD   Chief Complaint: Hospital follow-up  History of Present Illness:    William Sawyer is a 87 y.o. male with a hx of HTN, HLD, DM2, gout, permanent afib on Xarelto, HFpEF, severe AS who is being seen for hospital follow-up.   He was admitted for CHF treated with IV lasix. Echo showed LVEF >55%, severely dilated LA, mild MR, mod to severe AS with mean gradient 33mHg. He was interested in TAVR work-up. He was discharged on lasix '40mg'$  daily.   ED visit 8/6 for chest pain, vitals wnl. HS trop 20>18. EKG with afib, 101bpm, CXR right pleural effusion. Pain controlled and he was discharged home.   Today, the patient reports he is overall doing well. He has decreased salt. Lower leg edema is much improved, but still pedal edema on exam. Still has some shortness of breath. No chest pain, dizziness, lightheadedness. He uses a mobility scooter, can't walk with knees. He lives at home with his wife. Breathing is not worse with exertion or laying flat. Says he has spells of anxiety. He thinks this is what brought him to the ER visit on 8/6. Says he feels SOB and thoughts are racing. He is taking lasix '40mg'$  daily. Doesn't take weights every day. He is taking Toprol XL '25mg'$  twice a day. No bleeding issues on xarelto.He started taking ron  pills. BP log shows 120-130s/70-80sd, HR upper 90s. EKG shows HR 104 bpm.  Past Medical History:  Diagnosis Date   Atrial fibrillation, chronic (HCC)    Diabetes mellitus without complication ([place]    Gout    HLD (hyperlipidemia)    HTN (hypertension)     Past Surgical History:  Procedure Laterality Date   Left hip replacement     REPLACEMENT TOTAL KNEE BILATERAL Bilateral     Current Medications: No outpatient medications  have been marked as taking for the 07/31/21 encounter (Appointment) with FKathlen Mody Hermes Wafer H, PA-C.     Allergies:   Patient has no known allergies.   Social History   Socioeconomic History   Marital status: Married    Spouse name: Not on file   Number of children: Not on file   Years of education: Not on file   Highest education level: Not on file  Occupational History   Not on file  Tobacco Use   Smoking status: Never   Smokeless tobacco: Never  Substance and Sexual Activity   Alcohol use: Not Currently   Drug use: Never   Sexual activity: Not on file  Other Topics Concern   Not on file  Social History Narrative   Not on file   Social Determinants of Health   Financial Resource Strain: Not on file  Food Insecurity: Not on file  Transportation Needs: Not on file  Physical Activity: Not on file  Stress: Not on file  Social Connections: Not on file     Family History: The patient's family history includes Breast cancer in his mother; Pancreatic cancer in his father.  ROS:   Please see the history of present illness.     All other systems reviewed and are negative.  EKGs/Labs/Other Studies Reviewed:    The following studies were reviewed today:  Echo 07/21/21  1. Left ventricular  ejection fraction, by estimation, is >55%. The left  ventricle has normal function. Left ventricular endocardial border not  optimally defined to evaluate regional wall motion. There is mild left  ventricular hypertrophy. Left  ventricular diastolic function could not be evaluated.   2. Right ventricular systolic function is normal. The right ventricular  size is normal.   3. Left atrial size was severely dilated.   4. The mitral valve was not well visualized. Mild mitral valve  regurgitation. No evidence of mitral stenosis. Moderate mitral annular  calcification.   5. The aortic valve was not well visualized. Aortic valve regurgitation  is not visualized. Moderate to severe aortic valve  stenosis. Aortic valve  mean gradient measures 38.0 mmHg. Valve area cannot be calculated due to  poor image quality. Dimension less   index (VTI) is 0.26, consistent with borderline severe aortic stenosis.   EKG:  EKG is ordered today.  The ekg ordered today demonstrates AFIB, 104bpm, non specific T wave changes, no significant change  Recent Labs: 07/20/2021: B Natriuretic Peptide 202.9 07/23/2021: Magnesium 2.0 07/25/2021: ALT 13; BUN 21; Creatinine, Ser 0.84; Hemoglobin 12.4; Platelets 283; Potassium 4.3; Sodium 135  Recent Lipid Panel    Component Value Date/Time   CHOL 103 07/21/2021 0622   TRIG 38 07/21/2021 0622   HDL 35 (L) 07/21/2021 0622   CHOLHDL 2.9 07/21/2021 0622   VLDL 8 07/21/2021 0622   LDLCALC 60 07/21/2021 0622     Physical Exam:    VS:  There were no vitals taken for this visit.    Wt Readings from Last 3 Encounters:  07/25/21 246 lb (111.6 kg)  07/24/21 235 lb 14.3 oz (107 kg)     GEN:  Well nourished, well developed in no acute distress HEENT: Normal NECK: No JVD; No carotid bruits LYMPHATICS: No lymphadenopathy CARDIAC: Irreg Irreg, + murmur, no rubs, gallops RESPIRATORY:  Clear to auscultation without rales, wheezing or rhonchi  ABDOMEN: Soft, non-tender, non-distended MUSCULOSKELETAL:  2+ pedal edema; No deformity  SKIN: Warm and dry NEUROLOGIC:  Alert and oriented x 3 PSYCHIATRIC:  Normal affect   ASSESSMENT:    1. Chronic diastolic heart failure ([place])   2. Atrial fibrillation, chronic ([place])   3. Essential hypertension   4. Hyperlipidemia, mixed   5. Severe aortic stenosis    PLAN:    In order of problems listed above:  HFpEF Patient still has 2+ pedal edema on exa, and also reports some episodes of SOB, however unsure if this is from AS/CHF vs anxiety. Reports swelling is unchanged since discharge. Weight is also the same. He is following a low salt diet. CHF educations discussed. Cannot obtain RedsVest, I will check a BNP today. I will  increase lasix to '60mg'$  daily. BMET in a week. Continue BB.   Severe AS Recent echo showed mod to severe AS with mean gradient 56mHg. Patient wanting to undergo TAVR work-up, will refer to structural heart team.   Permanent Afib H/o of afib with relatively controlled rates. EKG with 104bpm. I will increase Toprol to 1067maily. Continue Xarelto for stroke prophylaxis.   HTN Bps at home 120-130s/-60-70s. Increase Toprol-XL as above  HLD  LDL 60 07/2021. Continue simvastatin  Disposition: Follow up in 2-3 week(s) with Md/APP    Signed, Therron Sells H Ninfa MeekerPA-C  07/31/2021 7:49 AM    [facility]

## 2021-07-31 ENCOUNTER — Ambulatory Visit: Payer: Medicare Other | Admitting: Medical

## 2021-07-31 ENCOUNTER — Other Ambulatory Visit: Payer: Self-pay

## 2021-07-31 ENCOUNTER — Encounter: Payer: Self-pay | Admitting: Medical

## 2021-07-31 VITALS — BP 134/76 | HR 107 | Ht 73.0 in | Wt 236.2 lb

## 2021-07-31 DIAGNOSIS — I350 Nonrheumatic aortic (valve) stenosis: Secondary | ICD-10-CM | POA: Diagnosis not present

## 2021-07-31 DIAGNOSIS — I10 Essential (primary) hypertension: Secondary | ICD-10-CM | POA: Diagnosis not present

## 2021-07-31 DIAGNOSIS — I482 Chronic atrial fibrillation, unspecified: Secondary | ICD-10-CM | POA: Diagnosis not present

## 2021-07-31 DIAGNOSIS — I5032 Chronic diastolic (congestive) heart failure: Secondary | ICD-10-CM

## 2021-07-31 DIAGNOSIS — E782 Mixed hyperlipidemia: Secondary | ICD-10-CM | POA: Diagnosis not present

## 2021-07-31 MED ORDER — FUROSEMIDE 40 MG PO TABS: 60.0000 mg | ORAL_TABLET | Freq: Every day | ORAL | 11 refills | Status: DC

## 2021-07-31 MED ORDER — METOPROLOL SUCCINATE ER 100 MG PO TB24: 100.0000 mg | ORAL_TABLET | Freq: Every day | ORAL | 3 refills | Status: AC

## 2021-07-31 NOTE — Patient Instructions (Addendum)
Medication Instructions:   Please INCREASE Lasix to 60 mg daily Toprol to 100 mg daily  *If you need a refill on your cardiac medications before your next appointment, please call your pharmacy*   Lab Work: BNP (today) LABS WILL APPEAR ON [place], ABNORMAL RESULTS WILL BE CALLED BMET (in one week Walk into medical mall at the check in desk, they will direct you to lab registration, hours for labs are Monday-Friday 07:00am-5:30pm (no appointment necessary)  Testing/Procedures: None   Follow-Up: At [facility], you and your health needs are our priority.  As part of our continuing mission to provide you with exceptional heart care, we have created designated Provider Care Teams.  These Care Teams include your primary Cardiologist (physician) and Advanced Practice Providers (APPs -  Physician Assistants and Nurse Practitioners) who all work together to provide you with the care you need, when you need it.  We recommend signing up for the patient portal called "MyChart".  Sign up information is provided on this After Visit Summary.  MyChart is used to connect with patients for Virtual Visits (Telemedicine).  Patients are able to view lab/test results, encounter notes, upcoming appointments, etc.  Non-urgent messages can be sent to your provider as well.   To learn more about what you can do with MyChart, go to [email or web address].    Your next appointment:   2-3 week(s)  The format for your next appointment:   In Person  Provider:   Cadence Jorene Minors  We placed a referral to the [facility] in [place], they will give you a call to schedule your first appointment.

## 2021-08-01 LAB — BRAIN NATRIURETIC PEPTIDE: BNP: 206.2 pg/mL — ABNORMAL HIGH (ref 0.0–100.0)

## 2021-08-03 ENCOUNTER — Telehealth: Payer: Self-pay | Admitting: *Deleted

## 2021-08-03 NOTE — Telephone Encounter (Signed)
Left voicemail message to call back for review of results.  

## 2021-08-03 NOTE — Telephone Encounter (Signed)
-----   Message from [place], PA-C sent at 08/02/2021  9:28 PM EDT ----- BNP mildly elevated, similar to in the hospital. We will await BMET in a week to change dose.

## 2021-08-04 ENCOUNTER — Telehealth: Payer: Self-pay

## 2021-08-04 NOTE — Telephone Encounter (Signed)
Left message for the pt to contact the Structural heart team to arrange consultation for TAVR.

## 2021-08-04 NOTE — Progress Notes (Signed)
Patient ID: William Sawyer, male    DOB: 10-16-1933, 87 y.o.   MRN: [id number]  HPI  William Sawyer is a 87 y/o male with a history of DM, hyperlipidemia, HTN, chronic atrial fibrillation and chronic heart failure.   Echo report from 07/21/21 reviewed and showed an EF of >55% along with mild LVH, severe LAE, mild William and moderate/ severe AS with aortic valve mean gradient of 38.0 mmHg.   Was in the ED 07/25/21 due to shortness of breath and chest pain where he was evaluated and released. Admitted 07/20/21 due to acute HF. Initially given IV lasix with transition to oral diuretics. Cardiology consult obtained. Discharged after 4 days.   He presents today for his initial visit with a chief complaint of minimal fatigue upon moderate exertion. He describes this as chronic in nature having been present for several years. He has associated shortness of breath, pedal edema, (improving) and easy bruising along with this. He denies any difficulty sleeping, dizziness, abdominal distention, palpitations, chest pain or cough.   Has upcoming appointment to discuss possible TAVR. Says that he'd rather have a good couple of years than 5 years of feeling bad & if surgery is recommended, he will do it.   Past Medical History:  Diagnosis Date   Atrial fibrillation, chronic (HCC)    CHF (congestive heart failure) (HCC)    Diabetes mellitus without complication (HCC)    Gout    HLD (hyperlipidemia)    HTN (hypertension)    Past Surgical History:  Procedure Laterality Date   Left hip replacement     REPLACEMENT TOTAL KNEE BILATERAL Bilateral    Family History  Problem Relation Age of Onset   Breast cancer Mother    Pancreatic cancer Father    Social History   Tobacco Use   Smoking status: Never   Smokeless tobacco: Never  Substance Use Topics   Alcohol use: Not Currently   No Known Allergies  Prior to Admission medications   Medication Sig Start Date End Date Taking? Authorizing Provider  allopurinol  (ZYLOPRIM) 300 MG tablet Take 300 mg by mouth daily. 05/15/21  Yes [provider]  furosemide (LASIX) 40 MG tablet Take 1.5 tablets (60 mg total) by mouth daily. 07/31/21 08/30/21 Yes Sawyer, William H, PA-C  LANTUS SOLOSTAR 100 UNIT/ML Solostar Pen Inject 15 Units into the skin in the morning and at bedtime. 07/06/21  Yes [provider]  metFORMIN (GLUCOPHAGE) 1000 MG tablet Take 1,000 mg by mouth 2 (two) times daily. 05/16/21  Yes [provider]  metoprolol succinate (TOPROL-XL) 100 MG 24 hr tablet Take 1 tablet (100 mg total) by mouth daily. Take with or immediately following a meal. 07/31/21 10/29/21 Yes Sawyer, William H, PA-C  simvastatin (ZOCOR) 20 MG tablet Take 20 mg by mouth at bedtime. 06/04/21  Yes [provider]  XARELTO 20 MG TABS tablet Take 20 mg by mouth daily. 06/04/21  Yes [provider]    Review of Systems  Constitutional:  Positive for fatigue. Negative for appetite change.  HENT:  Positive for hearing loss. Negative for congestion, postnasal drip and sore throat.   Eyes: Negative.   Respiratory:  Positive for shortness of breath. Negative for cough and chest tightness.   Cardiovascular:  Positive for leg swelling (improving). Negative for chest pain and palpitations.  Gastrointestinal:  Negative for abdominal distention and abdominal pain.  Endocrine: Negative.   Genitourinary: Negative.   Musculoskeletal:  Positive for neck pain. Negative for back  pain.  Skin: Negative.   Allergic/Immunologic: Negative.   Neurological:  Negative for dizziness and light-headedness.  Hematological:  Negative for adenopathy. Bruises/bleeds easily.  Psychiatric/Behavioral:  Negative for dysphoric mood and sleep disturbance (sleeping in recliner due to comfort). The patient is not nervous/anxious.    Vitals:   08/05/21 1442  BP: 122/70  Pulse: 83  Resp: 16  SpO2: 97%  Weight: 235 lb 1 oz (106.6 kg)  Height: '6\' 1"'$  (1.854 m)   Wt Readings from  Last 3 Encounters:  08/05/21 235 lb 1 oz (106.6 kg)  07/31/21 236 lb 4 oz (107.2 kg)  07/25/21 246 lb (111.6 kg)   Lab Results  Component Value Date   CREATININE 0.84 07/25/2021   CREATININE 0.89 07/23/2021   CREATININE 0.88 07/22/2021   Physical Exam Vitals and nursing note reviewed.  Constitutional:      Appearance: He is well-developed.  HENT:     Head: Normocephalic and atraumatic.     Right Ear: Decreased hearing noted.     Left Ear: Decreased hearing noted.  Cardiovascular:     Rate and Rhythm: Normal rate. Rhythm irregular.  Pulmonary:     Effort: Pulmonary effort is normal.     Breath sounds: Normal breath sounds. No wheezing or rales.  Abdominal:     Palpations: Abdomen is soft.     Tenderness: There is no abdominal tenderness.  Musculoskeletal:     Cervical back: Normal range of motion and neck supple.     Right lower leg: No tenderness. Edema (2+ pitting) present.     Left lower leg: No tenderness. Edema (2+ pitting) present.  Skin:    General: Skin is warm and dry.  Neurological:     General: No focal deficit present.     Mental Status: He is alert and oriented to person, place, and time.  Psychiatric:        Mood and Affect: Mood normal.        Behavior: Behavior normal.     Assessment & Plan:  1: Chronic heart failure with preserved ejection fraction with structural changes (LAE)- - NYHA class II - euvolemic today - weighing and he was instructed to weigh every morning and call for an overnight weight gain of > 2 pounds or a weekly weight gain of > 5 pounds - not adding salt and trying to follow a low sodium diet - may not be a candidate for ARNI until valve gets fixed - BNP 07/31/21 was 206.2  2: HTN- - BP looks good today - saw PCP William Sawyer) yesterday - BMP 07/25/21 reviewed and showed sodium 135, potassium 4.3, creatinine 0.84 and GFR >60  3: DM- - A1c 07/20/21 was 6.1% - glucose at home today was 97  4: Moderate/ severe AS- - saw cardiology  William Sawyer) 07/31/21 - has appt with structural heart team on 08/26/21 to discuss possible TAVR  5: Lymphedema- - stage 2 - says that the swelling improves "some" overnight - has tried wearing compression socks in the past but he could never get them pulled up because they were so swollen; he says that since the swelling has diminished, he will try wearing them again - encouraged to elevate them when sitting for long periods of time  - consider compression boots if edema persists   Medication list reviewed.   Return in 2 months or sooner for any questions/problems before then.

## 2021-08-05 ENCOUNTER — Other Ambulatory Visit: Payer: Self-pay

## 2021-08-05 ENCOUNTER — Encounter: Payer: Self-pay | Admitting: Family

## 2021-08-05 ENCOUNTER — Ambulatory Visit: Payer: Medicare Other | Attending: Family | Admitting: Family

## 2021-08-05 VITALS — BP 122/70 | HR 83 | Resp 16 | Ht 73.0 in | Wt 235.1 lb

## 2021-08-05 DIAGNOSIS — Z79899 Other long term (current) drug therapy: Secondary | ICD-10-CM | POA: Insufficient documentation

## 2021-08-05 DIAGNOSIS — Z96653 Presence of artificial knee joint, bilateral: Secondary | ICD-10-CM | POA: Insufficient documentation

## 2021-08-05 DIAGNOSIS — I890 Lymphedema, not elsewhere classified: Secondary | ICD-10-CM

## 2021-08-05 DIAGNOSIS — Z7984 Long term (current) use of oral hypoglycemic drugs: Secondary | ICD-10-CM | POA: Insufficient documentation

## 2021-08-05 DIAGNOSIS — Z96642 Presence of left artificial hip joint: Secondary | ICD-10-CM | POA: Insufficient documentation

## 2021-08-05 DIAGNOSIS — I350 Nonrheumatic aortic (valve) stenosis: Secondary | ICD-10-CM

## 2021-08-05 DIAGNOSIS — Z794 Long term (current) use of insulin: Secondary | ICD-10-CM | POA: Insufficient documentation

## 2021-08-05 DIAGNOSIS — E119 Type 2 diabetes mellitus without complications: Secondary | ICD-10-CM | POA: Insufficient documentation

## 2021-08-05 DIAGNOSIS — I5032 Chronic diastolic (congestive) heart failure: Secondary | ICD-10-CM

## 2021-08-05 DIAGNOSIS — I110 Hypertensive heart disease with heart failure: Secondary | ICD-10-CM | POA: Diagnosis not present

## 2021-08-05 DIAGNOSIS — I482 Chronic atrial fibrillation, unspecified: Secondary | ICD-10-CM | POA: Diagnosis not present

## 2021-08-05 DIAGNOSIS — E785 Hyperlipidemia, unspecified: Secondary | ICD-10-CM | POA: Insufficient documentation

## 2021-08-05 DIAGNOSIS — I10 Essential (primary) hypertension: Secondary | ICD-10-CM

## 2021-08-05 NOTE — Telephone Encounter (Signed)
I spoke with the pt earlier this morning and arranged Structural Heart Evaluation.

## 2021-08-05 NOTE — Patient Instructions (Signed)
Continue weighing daily and call for an overnight weight gain of > 2 pounds or a weekly weight gain of >5 pounds. 

## 2021-08-05 NOTE — Telephone Encounter (Signed)
Patient is returning your call.  

## 2021-08-06 ENCOUNTER — Ambulatory Visit: Payer: Medicare Other | Admitting: Cardiovascular Disease

## 2021-08-06 ENCOUNTER — Encounter: Payer: Self-pay | Admitting: Cardiovascular Disease

## 2021-08-06 ENCOUNTER — Other Ambulatory Visit: Payer: Self-pay

## 2021-08-06 ENCOUNTER — Encounter: Payer: Self-pay | Admitting: *Deleted

## 2021-08-06 VITALS — BP 124/68 | HR 64 | Ht 73.0 in | Wt 232.0 lb

## 2021-08-06 DIAGNOSIS — I350 Nonrheumatic aortic (valve) stenosis: Secondary | ICD-10-CM | POA: Diagnosis not present

## 2021-08-06 NOTE — Patient Instructions (Signed)
Medication Instructions:  Your physician recommends that you continue on your current medications as directed. Please refer to the Current Medication list given to you today.  *If you need a refill on your cardiac medications before your next appointment, please call your pharmacy*  Lab Work: If you have labs (blood work) drawn today and your tests are completely normal, you will receive your results only by: [place] (if you have MyChart) OR A paper copy in the mail If you have any lab test that is abnormal or we need to change your treatment, we will call you to review the results.  Testing/Procedures: Your physician has requested that you have a cardiac catheterization. Cardiac catheterization is used to diagnose and/or treat various heart conditions. Doctors may recommend this procedure for a number of different reasons. The most common reason is to evaluate chest pain. Chest pain can be a symptom of coronary artery disease (CAD), and cardiac catheterization can show whether plaque is narrowing or blocking your heart's arteries. This procedure is also used to evaluate the valves, as well as measure the blood flow and oxygen levels in different parts of your heart. For further information please visit [email or web address]. Please follow instruction sheet, as given.  Follow-Up: At [facility], you and your health needs are our priority.  As part of our continuing mission to provide you with exceptional heart care, we have created designated Provider Care Teams.  These Care Teams include your primary Cardiologist (physician) and Advanced Practice Providers (APPs -  Physician Assistants and Nurse Practitioners) who all work together to provide you with the care you need, when you need it.  We recommend signing up for the patient portal called "MyChart".  Sign up information is provided on this After Visit Summary.  MyChart is used to connect with patients for Virtual Visits (Telemedicine).   Patients are able to view lab/test results, encounter notes, upcoming appointments, etc.  Non-urgent messages can be sent to your provider as well.   To learn more about what you can do with MyChart, go to [email or web address].    Your next appointment:   Someone from our team will call you with follow up times and dates.   Other Instructions  [employer] OFFICE [place], [place] [address] Dept: [phone] Loc: [place]  08/06/2021  You are scheduled for a Cardiac Catheterization on Friday, September 9 with Dr. Sherren Mocha.  1. Please arrive at the [facility] (Main Entrance A) at [facility]: [address] [place], [place] [address] at 8:30 AM (This time is two hours before your procedure to ensure your preparation). Free valet parking service is available.   Special note: Every effort is made to have your procedure done on time. Please understand that emergencies sometimes delay scheduled procedures.  2. Diet: Do not eat solid foods after midnight.  The patient may have clear liquids until 5am upon the day of the procedure.  3. Labs: You will need to have blood drawn on Tuesday, September 6 at [facility], Go to 1st desk on your right to register.  Address: [place], [place] [address]  Open: 8am - 5pm  Phone: [phone]. You do not need to be fasting.  4. Medication instructions in preparation for your procedure:   Contrast Allergy: No  Stop taking Xarelto (Rivaroxaban) on Wednesday, September 7.  Take only 7 units of insulin  the night before your procedure. Do not take any insulin on the day of the procedure.  Do not take Diabetes Med Glucophage (Metformin) on the day of the procedure and HOLD 48 HOURS AFTER THE PROCEDURE.  On the morning of your procedure, take your Aspirin and any morning medicines NOT listed  above.  You may use sips of water.  5. Plan for one night stay--bring personal belongings. 6. Bring a current list of your medications and current insurance cards. 7. You MUST have a responsible person to drive you home. 8. Someone MUST be with you the first 24 hours after you arrive home or your discharge will be delayed. 9. Please wear clothes that are easy to get on and off and wear slip-on shoes.  Thank you for allowing [place] to care for you!   -- [facility]

## 2021-08-06 NOTE — Progress Notes (Signed)
[facility]   MULTIDISCIPLINARY HEART VALVE TEAM  Date:  08/07/2021   ID:  William Sawyer, DOB 11-29-1933, MRN [id number]  PCP:  [name], MD   Chief Complaint  Patient presents with   Shortness of Breath      HISTORY OF PRESENT ILLNESS: William Sawyer is a 87 y.o. male who presents for evaluation of aortic stenosis, referred by Dr Rockey Situ and Tarri Glenn, [place].   The patient has a history of permanent atrial fibrillation on long-term oral anticoagulation, managed by his primary care physician.  He was recently hospitalized with shortness of breath and was found to have signs and symptoms of acute diastolic heart failure.  The patient was treated with IV furosemide with significant improvement in his symptoms.  An echocardiogram performed during his hospitalization was suggestive of significant aortic stenosis.  The patient has very poor acoustic windows and difficult image quality.  However, the echocardiogram demonstrated the mean transaortic gradient of 38 mmHg with a dimensionless index of 0.26, suggestive of at least moderately severe aortic stenosis.  He is referred for further evaluation of his aortic stenosis.  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 He's been retired since 1996. He has been married for 5 years and he has 2 children (son who is local and daughter in [place], [place]). He has had multiple knee surgeries and has also undergone hip replacement. He is very limited from these problems and uses a motorized scooter to get around. He is able to ambulate short distances with a cane and he remains functionally independent, still driving his car.   The patient has known of having a heart murmur for many years. He describes symptoms of fatigue and shortness of breath with exertion now for about 3 years. He states that he feels 'great' 60% of the time and  40% of the time he has shortness of breath and tightness in his neck. States that these symptoms occur in spells and are not consistently related to physical exertion. 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 He admits to periods of heaviness in his chest but does not describe this as a pain. He complains of chronic orthopnea and sleeps most of the time in a reclining chair. He has chronic leg swelling for 15-20 years but notes some improvement with lasix.   The patient has had regular dental care and reports that he has lost some teeth and undergoing dental implants.   Past Medical History:  Diagnosis Date   Atrial fibrillation, chronic (HCC)    CHF (congestive heart failure) (HCC)    Diabetes mellitus without complication (HCC)    Gout    HLD (hyperlipidemia)    HTN (hypertension)     Current Outpatient Medications  Medication Sig Dispense Refill   allopurinol (ZYLOPRIM) 300 MG tablet Take 300 mg by mouth daily.     furosemide (LASIX) 40 MG tablet Take 1.5 tablets (60 mg total) by mouth daily. 45 tablet 11   LANTUS SOLOSTAR 100 UNIT/ML Solostar Pen Inject 15 Units into the skin in the morning and at bedtime.     metFORMIN (GLUCOPHAGE) 1000 MG tablet Take 1,000 mg by mouth 2 (two) times daily.     metoprolol succinate (TOPROL-XL) 100 MG 24 hr tablet Take 1 tablet (100 mg total) by mouth daily. Take with or immediately following a meal. 90  tablet 3   simvastatin (ZOCOR) 20 MG tablet Take 20 mg by mouth at bedtime.     XARELTO 20 MG TABS tablet Take 20 mg by mouth daily.     No current facility-administered medications for this visit.    ALLERGIES:   Patient has no known allergies.   SOCIAL HISTORY:  The patient  reports that he has never smoked. He has never used smokeless tobacco. He reports that he does not currently use alcohol. He reports that he does not use drugs.   FAMILY HISTORY:  The patient's family history includes Breast cancer in his mother;  Pancreatic cancer in his father.   REVIEW OF SYSTEMS:  Positive for knee pain, unsteady gait, leg weakness, chronic leg swelling.   All other systems are reviewed and negative.   PHYSICAL EXAM: VS:  BP 124/68   Pulse 64   Ht '6\' 1"'$  (1.854 m)   Wt 232 lb (105.2 kg)   SpO2 97%   BMI 30.61 kg/m  , BMI Body mass index is 30.61 kg/m. GEN: Elderly male, in no acute distress HEENT: normal Neck: No JVD. carotids 2+ with bilateral bruits Cardiac: The heart is irregularly irregular with a 3/6 harsh late peaking crescendo decrescendo murmur at the RUSB. No diastolic murmur. 1+ right pretibial edema, trace left pretibial edema. Chronic stasis dermatitis.  Pedal pulses 2+ = bilaterally  Respiratory:  clear to auscultation bilaterally GI: soft, nontender, nondistended, + BS MS: no deformity or atrophy Skin: warm and dry, stasis changes Neuro:  Strength and sensation are intact Psych: euthymic mood, full affect  EKG:  EKG from 07/31/21 reviewed and demonstrates atrial fibrillation with rapid ventricular response, HR 112 bpm  RECENT LABS: 07/23/2021: Magnesium 2.0 07/25/2021: ALT 13; BUN 21; Creatinine, Ser 0.84; Hemoglobin 12.4; Platelets 283; Potassium 4.3; Sodium 135 07/31/2021: BNP 206.2  07/21/2021: Cholesterol 103; HDL 35; LDL Cholesterol 60; Total CHOL/HDL Ratio 2.9; Triglycerides 38; VLDL 8   Estimated Creatinine Clearance: 81.8 mL/min (by C-G formula based on SCr of 0.84 mg/dL).   Wt Readings from Last 3 Encounters:  08/06/21 232 lb (105.2 kg)  08/05/21 235 lb 1 oz (106.6 kg)  07/31/21 236 lb 4 oz (107.2 kg)     CARDIAC STUDIES: Echo:  IMPRESSIONS     1. Left ventricular ejection fraction, by estimation, is >55%. The left  ventricle has normal function. Left ventricular endocardial border not  optimally defined to evaluate regional wall motion. There is mild left  ventricular hypertrophy. Left  ventricular diastolic function could not be evaluated.   2. Right ventricular systolic  function is normal. The right ventricular  size is normal.   3. Left atrial size was severely dilated.   4. The mitral valve was not well visualized. Mild mitral valve  regurgitation. No evidence of mitral stenosis. Moderate mitral annular  calcification.   5. The aortic valve was not well visualized. Aortic valve regurgitation  is not visualized. Moderate to severe aortic valve stenosis. Aortic valve  mean gradient measures 38.0 mmHg. Valve area cannot be calculated due to  poor image quality. Dimension less   index (VTI) is 0.26, consistent with borderline severe aortic stenosis.   FINDINGS   Left Ventricle: Left ventricular ejection fraction, by estimation, is  >55%. The left ventricle has normal function. Left ventricular endocardial  border not optimally defined to evaluate regional wall motion. Definity  contrast agent was given IV to  delineate the left ventricular endocardial borders. The left ventricular  internal cavity size  was normal in size. There is mild left ventricular  hypertrophy. Left ventricular diastolic function could not be evaluated  due to atrial fibrillation. Left  ventricular diastolic function could not be evaluated.   Right Ventricle: The right ventricular size is normal. Right vetricular  wall thickness was not well visualized. Right ventricular systolic  function is normal.   Left Atrium: Left atrial size was severely dilated.   Right Atrium: Right atrial size was not well visualized.   Pericardium: Trivial pericardial effusion is present.   Mitral Valve: The mitral valve was not well visualized. Moderate mitral  annular calcification. Mild mitral valve regurgitation. No evidence of  mitral valve stenosis. MV peak gradient, 9.6 mmHg. The mean mitral valve  gradient is 4.0 mmHg.   Tricuspid Valve: The tricuspid valve is not well visualized.   Aortic Valve: The aortic valve was not well visualized. Aortic valve  regurgitation is not visualized.  Moderate to severe aortic stenosis is  present. Aortic valve mean gradient measures 38.0 mmHg. Aortic valve peak  gradient measures 57.5 mmHg.   Pulmonic Valve: The pulmonic valve was not well visualized.   Aorta: The aortic root was not well visualized.   IAS/Shunts: The interatrial septum was not well visualized.      LEFT VENTRICLE  PLAX 2D  LVIDd:         3.78 cm     Diastology  LVIDs:         1.56 cm     LV e' medial:    6.09 cm/s  LV PW:         1.17 cm     LV E/e' medial:  23.2  LV IVS:        1.26 cm     LV e' lateral:   6.74 cm/s                             LV E/e' lateral: 21.0     LV Volumes (MOD)  LV vol d, MOD A2C: 61.8 ml  LV vol d, MOD A4C: 78.8 ml  LV vol s, MOD A2C: 28.7 ml  LV vol s, MOD A4C: 31.3 ml  LV SV MOD A2C:     33.1 ml  LV SV MOD A4C:     78.8 ml  LV SV MOD BP:      40.6 ml   LEFT ATRIUM            Index  LA Vol (A4C): 127.0 ml 52.80 ml/m   AORTIC VALVE  AV Vmax:           379.00 cm/s  AV Vmean:          276.500 cm/s  AV VTI:            0.806 m  AV Peak Grad:      57.5 mmHg  AV Mean Grad:      38.0 mmHg  LVOT Vmax:         81.70 cm/s  LVOT Vmean:        53.500 cm/s  LVOT VTI:          0.176 m  LVOT/AV VTI ratio: 0.22   MITRAL VALVE  MV Area (PHT): 2.93 cm     SHUNTS  MV Peak grad:  9.6 mmHg     Systemic VTI: 0.18 m  MV Mean grad:  4.0 mmHg  MV Vmax:       1.55  m/s  MV Vmean:      85.7 cm/s  MV Decel Time: 259 msec  MV E velocity: 141.33 cm/s    STS RISK CALCULATOR: Isolated AVR: Risk of Mortality: 2.359% Renal Failure: 3.653% Permanent Stroke: 1.978% Prolonged Ventilation: 6.377% DSW Infection: 0.161% Reoperation: 3.709% Morbidity or Mortality: 13.104% Short Length of Stay: 22.377% Long Length of Stay: 8.178%  ASSESSMENT AND PLAN: 65.  87 year old gentleman with permanent atrial fibrillation, recent hospitalization with acute diastolic heart failure, and what appears to be severe, stage D1 aortic stenosis.  I have  personally reviewed the patient's recent echo study.  The image quality is very limited.  It is difficult to visualize the aortic valve, but in a few views it does appear to be significantly calcified and restricted.  The peak transaortic velocity is 3.8 m/s with peak and mean transvalvular gradients of 58 and 38 mmHg, respectively.  The dimensionless index is 0.22.  The patient's exam suggest severe aortic stenosis with a late peaking crescendo decrescendo murmur and markedly diminished A2 sound.  With his recent hospitalization for heart failure, I think we should proceed with further evaluation for treatment of severe aortic stenosis.  I have recommended a gated cardiac CTA to assess anatomic characteristics of the aortic valve and anatomic feasibility of TAVR.  The patient will also undergo a CTA of the chest, abdomen, and pelvis to evaluate for vascular access for TAVR.  In addition, I have recommended right and left heart catheterization to evaluate his hemodynamics, better characterize the severity of his aortic stenosis, and evaluate for the presence of obstructive coronary artery disease. I have reviewed the risks, indications, and alternatives to cardiac catheterization, possible angioplasty, and stenting with the patient. Risks include but are not limited to bleeding, infection, vascular injury, stroke, myocardial infection, arrhythmia, kidney injury, radiation-related injury in the case of prolonged fluoroscopy use, emergency cardiac surgery, and death. The patient understands the risks of serious complication is 1-2 in [id number] with diagnostic cardiac cath and 1-2% or less with angioplasty/stenting.    I have reviewed the natural history of aortic stenosis with the patient today. We have discussed the limitations of medical therapy and the poor prognosis associated with symptomatic aortic stenosis. We have reviewed potential treatment options, including palliative medical therapy, conventional surgical  aortic valve replacement, and transcatheter aortic valve replacement. We discussed treatment options in the context of the patient's specific comorbid medical conditions.  Considering the patient's advanced age and severely reduced functional capacity related to his end-stage osteoarthritis, TAVR would clearly be the preferred treatment for him as long there are no anatomic contraindications.  Once his CTA and cardiac catheterization studies are completed, he will be referred for formal cardiac surgical consultation as part of a multidisciplinary approach to his care.  Deatra James 08/07/2021 4:55 PM     [address] [place] [address]  [phone] (office) [phone] (fax)

## 2021-08-06 NOTE — Telephone Encounter (Signed)
This is the 3rd call over 3 days. Will send letter with results.   Left voicemail message that we are calling with results with our number to call back.

## 2021-08-06 NOTE — H&P (View-Only) (Signed)
[facility]   MULTIDISCIPLINARY HEART VALVE TEAM  Date:  08/07/2021   ID:  William Sawyer, DOB 1933-03-11, MRN [id number]  PCP:  [name], MD   Chief Complaint  Patient presents with   Shortness of Breath      HISTORY OF PRESENT ILLNESS: William Sawyer is a 87 y.o. male who presents for evaluation of aortic stenosis, referred by Dr Rockey Situ and Tarri Glenn, [place].   The patient has a history of permanent atrial fibrillation on long-term oral anticoagulation, managed by his primary care physician.  He was recently hospitalized with shortness of breath and was found to have signs and symptoms of acute diastolic heart failure.  The patient was treated with IV furosemide with significant improvement in his symptoms.  An echocardiogram performed during his hospitalization was suggestive of significant aortic stenosis.  The patient has very poor acoustic windows and difficult image quality.  However, the echocardiogram demonstrated the mean transaortic gradient of 38 mmHg with a dimensionless index of 0.26, suggestive of at least moderately severe aortic stenosis.  He is referred for further evaluation of his aortic stenosis.  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 He's been retired since 1996. He has been married for 70 years and he has 2 children (son who is local and daughter in [place], [place]). He has had multiple knee surgeries and has also undergone hip replacement. He is very limited from these problems and uses a motorized scooter to get around. He is able to ambulate short distances with a cane and he remains functionally independent, still driving his car.   The patient has known of having a heart murmur for many years. He describes symptoms of fatigue and shortness of breath with exertion now for about 3 years. He states that he feels 'great' 60% of the time and  40% of the time he has shortness of breath and tightness in his neck. States that these symptoms occur in spells and are not consistently related to physical exertion. 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 He admits to periods of heaviness in his chest but does not describe this as a pain. He complains of chronic orthopnea and sleeps most of the time in a reclining chair. He has chronic leg swelling for 15-20 years but notes some improvement with lasix.   The patient has had regular dental care and reports that he has lost some teeth and undergoing dental implants.   Past Medical History:  Diagnosis Date   Atrial fibrillation, chronic (HCC)    CHF (congestive heart failure) (HCC)    Diabetes mellitus without complication (HCC)    Gout    HLD (hyperlipidemia)    HTN (hypertension)     Current Outpatient Medications  Medication Sig Dispense Refill   allopurinol (ZYLOPRIM) 300 MG tablet Take 300 mg by mouth daily.     furosemide (LASIX) 40 MG tablet Take 1.5 tablets (60 mg total) by mouth daily. 45 tablet 11   LANTUS SOLOSTAR 100 UNIT/ML Solostar Pen Inject 15 Units into the skin in the morning and at bedtime.     metFORMIN (GLUCOPHAGE) 1000 MG tablet Take 1,000 mg by mouth 2 (two) times daily.     metoprolol succinate (TOPROL-XL) 100 MG 24 hr tablet Take 1 tablet (100 mg total) by mouth daily. Take with or immediately following a meal. 90  tablet 3   simvastatin (ZOCOR) 20 MG tablet Take 20 mg by mouth at bedtime.     XARELTO 20 MG TABS tablet Take 20 mg by mouth daily.     No current facility-administered medications for this visit.    ALLERGIES:   Patient has no known allergies.   SOCIAL HISTORY:  The patient  reports that he has never smoked. He has never used smokeless tobacco. He reports that he does not currently use alcohol. He reports that he does not use drugs.   FAMILY HISTORY:  The patient's family history includes Breast cancer in his mother;  Pancreatic cancer in his father.   REVIEW OF SYSTEMS:  Positive for knee pain, unsteady gait, leg weakness, chronic leg swelling.   All other systems are reviewed and negative.   PHYSICAL EXAM: VS:  BP 124/68   Pulse 64   Ht '6\' 1"'$  (1.854 m)   Wt 232 lb (105.2 kg)   SpO2 97%   BMI 30.61 kg/m  , BMI Body mass index is 30.61 kg/m. GEN: Elderly male, in no acute distress HEENT: normal Neck: No JVD. carotids 2+ with bilateral bruits Cardiac: The heart is irregularly irregular with a 3/6 harsh late peaking crescendo decrescendo murmur at the RUSB. No diastolic murmur. 1+ right pretibial edema, trace left pretibial edema. Chronic stasis dermatitis.  Pedal pulses 2+ = bilaterally  Respiratory:  clear to auscultation bilaterally GI: soft, nontender, nondistended, + BS MS: no deformity or atrophy Skin: warm and dry, stasis changes Neuro:  Strength and sensation are intact Psych: euthymic mood, full affect  EKG:  EKG from 07/31/21 reviewed and demonstrates atrial fibrillation with rapid ventricular response, HR 112 bpm  RECENT LABS: 07/23/2021: Magnesium 2.0 07/25/2021: ALT 13; BUN 21; Creatinine, Ser 0.84; Hemoglobin 12.4; Platelets 283; Potassium 4.3; Sodium 135 07/31/2021: BNP 206.2  07/21/2021: Cholesterol 103; HDL 35; LDL Cholesterol 60; Total CHOL/HDL Ratio 2.9; Triglycerides 38; VLDL 8   Estimated Creatinine Clearance: 81.8 mL/min (by C-G formula based on SCr of 0.84 mg/dL).   Wt Readings from Last 3 Encounters:  08/06/21 232 lb (105.2 kg)  08/05/21 235 lb 1 oz (106.6 kg)  07/31/21 236 lb 4 oz (107.2 kg)     CARDIAC STUDIES: Echo:  IMPRESSIONS     1. Left ventricular ejection fraction, by estimation, is >55%. The left  ventricle has normal function. Left ventricular endocardial border not  optimally defined to evaluate regional wall motion. There is mild left  ventricular hypertrophy. Left  ventricular diastolic function could not be evaluated.   2. Right ventricular systolic  function is normal. The right ventricular  size is normal.   3. Left atrial size was severely dilated.   4. The mitral valve was not well visualized. Mild mitral valve  regurgitation. No evidence of mitral stenosis. Moderate mitral annular  calcification.   5. The aortic valve was not well visualized. Aortic valve regurgitation  is not visualized. Moderate to severe aortic valve stenosis. Aortic valve  mean gradient measures 38.0 mmHg. Valve area cannot be calculated due to  poor image quality. Dimension less   index (VTI) is 0.26, consistent with borderline severe aortic stenosis.   FINDINGS   Left Ventricle: Left ventricular ejection fraction, by estimation, is  >55%. The left ventricle has normal function. Left ventricular endocardial  border not optimally defined to evaluate regional wall motion. Definity  contrast agent was given IV to  delineate the left ventricular endocardial borders. The left ventricular  internal cavity size  was normal in size. There is mild left ventricular  hypertrophy. Left ventricular diastolic function could not be evaluated  due to atrial fibrillation. Left  ventricular diastolic function could not be evaluated.   Right Ventricle: The right ventricular size is normal. Right vetricular  wall thickness was not well visualized. Right ventricular systolic  function is normal.   Left Atrium: Left atrial size was severely dilated.   Right Atrium: Right atrial size was not well visualized.   Pericardium: Trivial pericardial effusion is present.   Mitral Valve: The mitral valve was not well visualized. Moderate mitral  annular calcification. Mild mitral valve regurgitation. No evidence of  mitral valve stenosis. MV peak gradient, 9.6 mmHg. The mean mitral valve  gradient is 4.0 mmHg.   Tricuspid Valve: The tricuspid valve is not well visualized.   Aortic Valve: The aortic valve was not well visualized. Aortic valve  regurgitation is not visualized.  Moderate to severe aortic stenosis is  present. Aortic valve mean gradient measures 38.0 mmHg. Aortic valve peak  gradient measures 57.5 mmHg.   Pulmonic Valve: The pulmonic valve was not well visualized.   Aorta: The aortic root was not well visualized.   IAS/Shunts: The interatrial septum was not well visualized.      LEFT VENTRICLE  PLAX 2D  LVIDd:         3.78 cm     Diastology  LVIDs:         1.56 cm     LV e' medial:    6.09 cm/s  LV PW:         1.17 cm     LV E/e' medial:  23.2  LV IVS:        1.26 cm     LV e' lateral:   6.74 cm/s                             LV E/e' lateral: 21.0     LV Volumes (MOD)  LV vol d, MOD A2C: 61.8 ml  LV vol d, MOD A4C: 78.8 ml  LV vol s, MOD A2C: 28.7 ml  LV vol s, MOD A4C: 31.3 ml  LV SV MOD A2C:     33.1 ml  LV SV MOD A4C:     78.8 ml  LV SV MOD BP:      40.6 ml   LEFT ATRIUM            Index  LA Vol (A4C): 127.0 ml 52.80 ml/m   AORTIC VALVE  AV Vmax:           379.00 cm/s  AV Vmean:          276.500 cm/s  AV VTI:            0.806 m  AV Peak Grad:      57.5 mmHg  AV Mean Grad:      38.0 mmHg  LVOT Vmax:         81.70 cm/s  LVOT Vmean:        53.500 cm/s  LVOT VTI:          0.176 m  LVOT/AV VTI ratio: 0.22   MITRAL VALVE  MV Area (PHT): 2.93 cm     SHUNTS  MV Peak grad:  9.6 mmHg     Systemic VTI: 0.18 m  MV Mean grad:  4.0 mmHg  MV Vmax:       1.55  m/s  MV Vmean:      85.7 cm/s  MV Decel Time: 259 msec  MV E velocity: 141.33 cm/s    STS RISK CALCULATOR: Isolated AVR: Risk of Mortality: 2.359% Renal Failure: 3.653% Permanent Stroke: 1.978% Prolonged Ventilation: 6.377% DSW Infection: 0.161% Reoperation: 3.709% Morbidity or Mortality: 13.104% Short Length of Stay: 22.377% Long Length of Stay: 8.178%  ASSESSMENT AND PLAN: 53.  87 year old gentleman with permanent atrial fibrillation, recent hospitalization with acute diastolic heart failure, and what appears to be severe, stage D1 aortic stenosis.  I have  personally reviewed the patient's recent echo study.  The image quality is very limited.  It is difficult to visualize the aortic valve, but in a few views it does appear to be significantly calcified and restricted.  The peak transaortic velocity is 3.8 m/s with peak and mean transvalvular gradients of 58 and 38 mmHg, respectively.  The dimensionless index is 0.22.  The patient's exam suggest severe aortic stenosis with a late peaking crescendo decrescendo murmur and markedly diminished A2 sound.  With his recent hospitalization for heart failure, I think we should proceed with further evaluation for treatment of severe aortic stenosis.  I have recommended a gated cardiac CTA to assess anatomic characteristics of the aortic valve and anatomic feasibility of TAVR.  The patient will also undergo a CTA of the chest, abdomen, and pelvis to evaluate for vascular access for TAVR.  In addition, I have recommended right and left heart catheterization to evaluate his hemodynamics, better characterize the severity of his aortic stenosis, and evaluate for the presence of obstructive coronary artery disease. I have reviewed the risks, indications, and alternatives to cardiac catheterization, possible angioplasty, and stenting with the patient. Risks include but are not limited to bleeding, infection, vascular injury, stroke, myocardial infection, arrhythmia, kidney injury, radiation-related injury in the case of prolonged fluoroscopy use, emergency cardiac surgery, and death. The patient understands the risks of serious complication is 1-2 in [id number] with diagnostic cardiac cath and 1-2% or less with angioplasty/stenting.    I have reviewed the natural history of aortic stenosis with the patient today. We have discussed the limitations of medical therapy and the poor prognosis associated with symptomatic aortic stenosis. We have reviewed potential treatment options, including palliative medical therapy, conventional surgical  aortic valve replacement, and transcatheter aortic valve replacement. We discussed treatment options in the context of the patient's specific comorbid medical conditions.  Considering the patient's advanced age and severely reduced functional capacity related to his end-stage osteoarthritis, TAVR would clearly be the preferred treatment for him as long there are no anatomic contraindications.  Once his CTA and cardiac catheterization studies are completed, he will be referred for formal cardiac surgical consultation as part of a multidisciplinary approach to his care.  Deatra James 08/07/2021 4:55 PM     [address] [place] [address]  [phone] (office) [phone] (fax)

## 2021-08-11 ENCOUNTER — Other Ambulatory Visit: Payer: Self-pay

## 2021-08-11 ENCOUNTER — Encounter: Payer: Self-pay | Admitting: Family

## 2021-08-11 ENCOUNTER — Ambulatory Visit: Payer: Medicare Other | Attending: Family | Admitting: Family

## 2021-08-11 VITALS — BP 124/83 | HR 92 | Resp 18 | Ht 73.0 in | Wt 231.1 lb

## 2021-08-11 DIAGNOSIS — E119 Type 2 diabetes mellitus without complications: Secondary | ICD-10-CM

## 2021-08-11 DIAGNOSIS — I5032 Chronic diastolic (congestive) heart failure: Secondary | ICD-10-CM | POA: Diagnosis not present

## 2021-08-11 DIAGNOSIS — E785 Hyperlipidemia, unspecified: Secondary | ICD-10-CM | POA: Diagnosis not present

## 2021-08-11 DIAGNOSIS — I890 Lymphedema, not elsewhere classified: Secondary | ICD-10-CM | POA: Diagnosis not present

## 2021-08-11 DIAGNOSIS — I482 Chronic atrial fibrillation, unspecified: Secondary | ICD-10-CM | POA: Insufficient documentation

## 2021-08-11 DIAGNOSIS — Z7901 Long term (current) use of anticoagulants: Secondary | ICD-10-CM | POA: Diagnosis not present

## 2021-08-11 DIAGNOSIS — R0602 Shortness of breath: Secondary | ICD-10-CM | POA: Insufficient documentation

## 2021-08-11 DIAGNOSIS — I110 Hypertensive heart disease with heart failure: Secondary | ICD-10-CM | POA: Diagnosis not present

## 2021-08-11 DIAGNOSIS — I10 Essential (primary) hypertension: Secondary | ICD-10-CM

## 2021-08-11 DIAGNOSIS — I350 Nonrheumatic aortic (valve) stenosis: Secondary | ICD-10-CM

## 2021-08-11 DIAGNOSIS — R5383 Other fatigue: Secondary | ICD-10-CM | POA: Insufficient documentation

## 2021-08-11 DIAGNOSIS — Z7984 Long term (current) use of oral hypoglycemic drugs: Secondary | ICD-10-CM | POA: Diagnosis not present

## 2021-08-11 DIAGNOSIS — Z79899 Other long term (current) drug therapy: Secondary | ICD-10-CM | POA: Insufficient documentation

## 2021-08-11 NOTE — Patient Instructions (Addendum)
Continue weighing daily and call for an overnight weight gain of > 2 pounds or a weekly weight gain of >5 pounds.    On Mondays, Wednesdays and Fridays, take 2 fluid pills a day. The other days of the week, you will take your regular 1 1/2 tablets.

## 2021-08-11 NOTE — Progress Notes (Signed)
Patient ID: William Sawyer, male    DOB: Jan 29, 1933, 87 y.o.   MRN: [id number]  HPI  William Sawyer is a 87 y/o male with a history of DM, hyperlipidemia, HTN, chronic atrial fibrillation and chronic heart failure.   Echo report from 07/21/21 reviewed and showed an EF of >55% along with mild LVH, severe LAE, mild William and moderate/ severe AS with aortic valve mean gradient of 38.0 mmHg.   Was in the ED 07/25/21 due to shortness of breath and chest pain where he was evaluated and released. Admitted 07/20/21 due to acute HF. Initially given IV lasix with transition to oral diuretics. Cardiology consult obtained. Discharged after 4 days.   He presents today for an acute visit with a chief complaint of moderate fatigue with little exertion. He says that this seems to be worsening over the last several weeks. He has associated shortness of breath & pedal edema along with this. He denies any difficulty sleeping, dizziness, cough, chest pain, palpitations, abdominal distention or weight gain.   He said that yesterday he was feeling so short of breath that he put a fan directly in front of his face and eventually his breathing improved.   Was able to be seen sooner for initial TAVR appointment. Has upcoming cath and other work up scheduled before finalizing surgery.   Past Medical History:  Diagnosis Date   Atrial fibrillation, chronic (HCC)    CHF (congestive heart failure) (HCC)    Diabetes mellitus without complication (HCC)    Gout    HLD (hyperlipidemia)    HTN (hypertension)    Past Surgical History:  Procedure Laterality Date   Left hip replacement     REPLACEMENT TOTAL KNEE BILATERAL Bilateral    Family History  Problem Relation Age of Onset   Breast cancer Mother    Pancreatic cancer Father    Social History   Tobacco Use   Smoking status: Never   Smokeless tobacco: Never  Substance Use Topics   Alcohol use: Not Currently   No Known Allergies  Prior to Admission medications    Medication Sig Start Date End Date Taking? Authorizing Provider  allopurinol (ZYLOPRIM) 300 MG tablet Take 300 mg by mouth daily. 05/15/21  Yes [provider]  furosemide (LASIX) 40 MG tablet Take 1.5 tablets (60 mg total) by mouth daily. Patient taking differently: Take 60 mg by mouth daily. M, W, F take '80mg'$ ; other days, take '60mg'$  07/31/21 08/30/21 Yes Furth, Cadence H, PA-C  LANTUS SOLOSTAR 100 UNIT/ML Solostar Pen Inject 15 Units into the skin in the morning and at bedtime. 07/06/21  Yes [provider]  metFORMIN (GLUCOPHAGE) 1000 MG tablet Take 1,000 mg by mouth 2 (two) times daily. Takes '1000mg'$  AM/ '500mg'$  PM 05/16/21  Yes [provider]  metoprolol succinate (TOPROL-XL) 100 MG 24 hr tablet Take 1 tablet (100 mg total) by mouth daily. Take with or immediately following a meal. 07/31/21 10/29/21 Yes Furth, Cadence H, PA-C  simvastatin (ZOCOR) 20 MG tablet Take 20 mg by mouth at bedtime. 06/04/21  Yes [provider]  XARELTO 20 MG TABS tablet Take 20 mg by mouth daily. 06/04/21  Yes [provider]   Review of Systems  Constitutional:  Positive for fatigue (easily). Negative for appetite change.  HENT:  Positive for hearing loss. Negative for congestion, postnasal drip and sore throat.   Eyes: Negative.   Respiratory:  Positive for shortness of breath. Negative for cough and chest tightness.   Cardiovascular:  Positive for leg swelling (improving). Negative for chest pain and palpitations.  Gastrointestinal:  Negative for abdominal distention and abdominal pain.  Endocrine: Negative.   Genitourinary: Negative.   Musculoskeletal:  Positive for neck pain. Negative for back pain.  Skin: Negative.   Allergic/Immunologic: Negative.   Neurological:  Negative for dizziness and light-headedness.  Hematological:  Negative for adenopathy. Bruises/bleeds easily.  Psychiatric/Behavioral:  Negative for dysphoric mood and sleep disturbance (sleeping in recliner  due to comfort). The patient is not nervous/anxious.    Vitals:   08/11/21 1057  BP: 124/83  Pulse: 92  Resp: 18  SpO2: 98%  Weight: 231 lb 2 oz (104.8 kg)  Height: '6\' 1"'$  (1.854 m)   Wt Readings from Last 3 Encounters:  08/11/21 231 lb 2 oz (104.8 kg)  08/06/21 232 lb (105.2 kg)  08/05/21 235 lb 1 oz (106.6 kg)   Lab Results  Component Value Date   CREATININE 0.84 07/25/2021   CREATININE 0.89 07/23/2021   CREATININE 0.88 07/22/2021   Physical Exam Vitals and nursing note reviewed.  Constitutional:      Appearance: He is well-developed.  HENT:     Head: Normocephalic and atraumatic.     Right Ear: Decreased hearing noted.     Left Ear: Decreased hearing noted.  Cardiovascular:     Rate and Rhythm: Normal rate. Rhythm irregular.  Pulmonary:     Effort: Pulmonary effort is normal.     Breath sounds: Normal breath sounds. No wheezing or rales.  Abdominal:     Palpations: Abdomen is soft.     Tenderness: There is no abdominal tenderness.  Musculoskeletal:     Cervical back: Normal range of motion and neck supple.     Right lower leg: No tenderness. Edema (2+ pitting) present.     Left lower leg: No tenderness. Edema (2+ pitting) present.  Skin:    General: Skin is warm and dry.  Neurological:     General: No focal deficit present.     Mental Status: He is alert and oriented to person, place, and time.  Psychiatric:        Mood and Affect: Mood normal.        Behavior: Behavior normal.     Assessment & Plan:  1: Chronic heart failure with preserved ejection fraction with structural changes (LAE)- - NYHA class III - euvolemic today - weighing daily; reminded to call for an overnight weight gain of > 2 pounds or a weekly weight gain of > 5 pounds - weight down 4 pounds from last visit here 1 month ago - not adding salt and trying to follow a low sodium diet - will increase furosemide from 60 to '80mg'$  on M, W, F; other days of the week, he's to keep taking the  '60mg'$  - may not be a candidate for ARNI until valve gets fixed - BNP 07/31/21 was 206.2  2: HTN- - BP looks good today (124/83) - sees PCP William Sawyer)  - BMP 07/25/21 reviewed and showed sodium 135, potassium 4.3, creatinine 0.84 and GFR >60  3: DM- - A1c 07/20/21 was 6.1% - glucose at home today was 88 - currently taking metformin '1000mg'$  AM/ '500mg'$  PM  4: Moderate/ severe AS- - saw cardiology Kathlen Mody) 07/31/21 - saw cardiology Burt Knack) 08/06/21; work up in process - has cath scheduled for 08/28/21  5: Lymphedema- - stage 2 - says that the swelling improves "some" overnight - wearing zip up compression socks daily with removal at bedtime; says  that his legs feel better since he's been wearing them - encouraged to elevate them when sitting for long periods of time  - consider compression boots if edema persists   Medication list reviewed.   Return in 2 months or sooner for any questions/problems before then.

## 2021-08-21 ENCOUNTER — Encounter: Payer: Medicare Other | Admitting: Surgery

## 2021-08-25 ENCOUNTER — Other Ambulatory Visit
Admission: RE | Admit: 2021-08-25 | Discharge: 2021-08-25 | Disposition: A | Discharge status: Home / Self Care | Payer: Medicare Other | Source: Ambulatory Visit | Attending: Cardiovascular Disease | Admitting: Cardiovascular Disease

## 2021-08-25 DIAGNOSIS — I350 Nonrheumatic aortic (valve) stenosis: Secondary | ICD-10-CM | POA: Diagnosis present

## 2021-08-25 LAB — CBC WITH DIFFERENTIAL/PLATELET
Abs Immature Granulocytes: 0.03 10*3/uL (ref 0.00–0.07)
Basophils Absolute: 0 10*3/uL (ref 0.0–0.1)
Basophils Relative: 1 %
Eosinophils Absolute: 0.4 10*3/uL (ref 0.0–0.5)
Eosinophils Relative: 5 %
HCT: 37.4 % — ABNORMAL LOW (ref 39.0–52.0)
Hemoglobin: 12.9 g/dL — ABNORMAL LOW (ref 13.0–17.0)
Immature Granulocytes: 0 %
Lymphocytes Relative: 25 %
Lymphs Abs: 1.9 10*3/uL (ref 0.7–4.0)
MCH: 32.7 pg (ref 26.0–34.0)
MCHC: 34.5 g/dL (ref 30.0–36.0)
MCV: 94.9 fL (ref 80.0–100.0)
Monocytes Absolute: 0.6 10*3/uL (ref 0.1–1.0)
Monocytes Relative: 8 %
Neutro Abs: 4.7 10*3/uL (ref 1.7–7.7)
Neutrophils Relative %: 61 %
Platelets: 235 10*3/uL (ref 150–400)
RBC: 3.94 MIL/uL — ABNORMAL LOW (ref 4.22–5.81)
RDW: 14.1 % (ref 11.5–15.5)
WBC: 7.6 10*3/uL (ref 4.0–10.5)
nRBC: 0 % (ref 0.0–0.2)

## 2021-08-25 LAB — BASIC METABOLIC PANEL
Anion gap: 10 (ref 5–15)
BUN: 21 mg/dL (ref 8–23)
CO2: 31 mmol/L (ref 22–32)
Calcium: 9.1 mg/dL (ref 8.9–10.3)
Chloride: 95 mmol/L — ABNORMAL LOW (ref 98–111)
Creatinine, Ser: 0.85 mg/dL (ref 0.61–1.24)
GFR, Estimated: >60 mL/min (ref >60)
Glucose, Bld: 89 mg/dL (ref 70–99)
Potassium: 3.8 mmol/L (ref 3.5–5.1)
Sodium: 136 mmol/L (ref 135–145)

## 2021-08-26 ENCOUNTER — Institutional Professional Consult (permissible substitution): Payer: Medicare Other | Admitting: Cardiovascular Disease

## 2021-08-27 ENCOUNTER — Telehealth: Payer: Self-pay | Admitting: *Deleted

## 2021-08-27 NOTE — Telephone Encounter (Addendum)
Cardiac catheterization scheduled at [facility] for: Friday August 28, 2021 10:30 AM [facility] Main Entrance [facility]) at: 8:30 AM   No solid food after midnight prior to cath, clear liquids until 5 AM day of procedure.  Medication instructions: Hold: Xarelto-none 08/26/21 until post procedure Metformin-day of procedure and 48 hours post procedure Insulin-AM of procedure/ 1/2 usual HS dose prior to procedure Lasix-AM of procedure  Except hold medications morning medications can be taken pre-cath with sips of water including aspirin 81 mg.    Confirmed patient has responsible adult to drive home post procedure and be with patient first 24 hours after arriving home.  Patients are allowed one visitor in the waiting room during the time they are at the hospital for their procedure. Both patient and visitor must wear a mask once they enter the hospital.   Patient reports does not currently have any symptoms concerning for COVID-19 and no household members with COVID-19 like illness.     Reviewed procedure/mask/visitor instructions with patient.

## 2021-08-28 ENCOUNTER — Other Ambulatory Visit: Payer: Self-pay

## 2021-08-28 ENCOUNTER — Encounter (HOSPITAL_COMMUNITY)
Admission: RE | Disposition: A | Discharge status: Home / Self Care | Payer: Self-pay | Source: Home / Self Care | Attending: Cardiovascular Disease

## 2021-08-28 ENCOUNTER — Ambulatory Visit (HOSPITAL_COMMUNITY)
Admission: RE | Admit: 2021-08-28 | Discharge: 2021-08-28 | Disposition: A | Discharge status: Home / Self Care | Payer: Medicare Other | Attending: Cardiovascular Disease | Admitting: Cardiovascular Disease

## 2021-08-28 ENCOUNTER — Ambulatory Visit: Payer: Medicare Other | Admitting: Medical

## 2021-08-28 DIAGNOSIS — Z7984 Long term (current) use of oral hypoglycemic drugs: Secondary | ICD-10-CM | POA: Diagnosis not present

## 2021-08-28 DIAGNOSIS — I352 Nonrheumatic aortic (valve) stenosis with insufficiency: Secondary | ICD-10-CM | POA: Diagnosis not present

## 2021-08-28 DIAGNOSIS — I350 Nonrheumatic aortic (valve) stenosis: Secondary | ICD-10-CM | POA: Diagnosis not present

## 2021-08-28 DIAGNOSIS — Z79899 Other long term (current) drug therapy: Secondary | ICD-10-CM | POA: Diagnosis not present

## 2021-08-28 DIAGNOSIS — Z794 Long term (current) use of insulin: Secondary | ICD-10-CM | POA: Insufficient documentation

## 2021-08-28 DIAGNOSIS — I110 Hypertensive heart disease with heart failure: Secondary | ICD-10-CM | POA: Insufficient documentation

## 2021-08-28 DIAGNOSIS — I4821 Permanent atrial fibrillation: Secondary | ICD-10-CM | POA: Diagnosis not present

## 2021-08-28 DIAGNOSIS — I2510 Atherosclerotic heart disease of native coronary artery without angina pectoris: Secondary | ICD-10-CM | POA: Diagnosis not present

## 2021-08-28 DIAGNOSIS — I5032 Chronic diastolic (congestive) heart failure: Secondary | ICD-10-CM | POA: Diagnosis not present

## 2021-08-28 DIAGNOSIS — Z7901 Long term (current) use of anticoagulants: Secondary | ICD-10-CM | POA: Insufficient documentation

## 2021-08-28 HISTORY — PX: RIGHT/LEFT HEART CATH AND CORONARY ANGIOGRAPHY: CATH118266

## 2021-08-28 LAB — POCT I-STAT EG7
Acid-Base Excess: 3 mmol/L — ABNORMAL HIGH (ref 0.0–2.0)
Bicarbonate: 27.3 mmol/L (ref 20.0–28.0)
Calcium, Ion: 1.04 mmol/L — ABNORMAL LOW (ref 1.15–1.40)
HCT: 34 % — ABNORMAL LOW (ref 39.0–52.0)
Hemoglobin: 11.6 g/dL — ABNORMAL LOW (ref 13.0–17.0)
O2 Saturation: 73 %
Potassium: 2.7 mmol/L — CL (ref 3.5–5.1)
Sodium: 142 mmol/L (ref 135–145)
TCO2: 29 mmol/L (ref 22–32)
pCO2, Ven: 40.6 mmHg — ABNORMAL LOW (ref 44.0–60.0)
pH, Ven: 7.436 — ABNORMAL HIGH (ref 7.250–7.430)
pO2, Ven: 37 mmHg (ref 32.0–45.0)

## 2021-08-28 LAB — POCT I-STAT 7, (LYTES, BLD GAS, ICA,H+H)
Acid-Base Excess: 5 mmol/L — ABNORMAL HIGH (ref 0.0–2.0)
Bicarbonate: 30 mmol/L — ABNORMAL HIGH (ref 20.0–28.0)
Calcium, Ion: 1.12 mmol/L — ABNORMAL LOW (ref 1.15–1.40)
HCT: 41 % (ref 39.0–52.0)
Hemoglobin: 13.9 g/dL (ref 13.0–17.0)
O2 Saturation: 100 %
Potassium: 3.1 mmol/L — ABNORMAL LOW (ref 3.5–5.1)
Sodium: 138 mmol/L (ref 135–145)
TCO2: 31 mmol/L (ref 22–32)
pCO2 arterial: 44.6 mmHg (ref 32.0–48.0)
pH, Arterial: 7.435 (ref 7.350–7.450)
pO2, Arterial: 173 mmHg — ABNORMAL HIGH (ref 83.0–108.0)

## 2021-08-28 LAB — GLUCOSE, CAPILLARY
Glucose-Capillary: 61 mg/dL — ABNORMAL LOW (ref 70–99)
Glucose-Capillary: 96 mg/dL (ref 70–99)

## 2021-08-28 SURGERY — RIGHT/LEFT HEART CATH AND CORONARY ANGIOGRAPHY
Anesthesia: LOCAL

## 2021-08-28 MED ORDER — HEPARIN (PORCINE) IN NACL 1000-0.9 UT/500ML-% IV SOLN
INTRAVENOUS | Status: AC
  Filled 2021-08-28: qty 1000

## 2021-08-28 MED ORDER — SODIUM CHLORIDE 0.9 % IV SOLN: 250.0000 mL | INTRAVENOUS | Status: DC | PRN

## 2021-08-28 MED ORDER — VERAPAMIL HCL 2.5 MG/ML IV SOLN
INTRAVENOUS | Status: DC | PRN
  Administered 2021-08-28: 10 mL via INTRA_ARTERIAL

## 2021-08-28 MED ORDER — SODIUM CHLORIDE 0.9 % WEIGHT BASED INFUSION
3.0000 mL/kg/h | INTRAVENOUS | Status: AC
  Administered 2021-08-28: 3 mL/kg/h via INTRAVENOUS

## 2021-08-28 MED ORDER — HEPARIN (PORCINE) IN NACL 1000-0.9 UT/500ML-% IV SOLN
INTRAVENOUS | Status: DC | PRN
  Administered 2021-08-28 (×3): 500 mL

## 2021-08-28 MED ORDER — IOHEXOL 350 MG/ML SOLN
INTRAVENOUS | Status: DC | PRN
  Administered 2021-08-28: 55 mL

## 2021-08-28 MED ORDER — FENTANYL CITRATE (PF) 100 MCG/2ML IJ SOLN
INTRAMUSCULAR | Status: DC | PRN
  Administered 2021-08-28: 25 ug via INTRAVENOUS

## 2021-08-28 MED ORDER — SODIUM CHLORIDE 0.9 % WEIGHT BASED INFUSION: 1.0000 mL/kg/h | INTRAVENOUS | Status: DC

## 2021-08-28 MED ORDER — SODIUM CHLORIDE 0.9% FLUSH: 3.0000 mL | INTRAVENOUS | Status: DC | PRN

## 2021-08-28 MED ORDER — VERAPAMIL HCL 2.5 MG/ML IV SOLN
INTRAVENOUS | Status: AC
  Filled 2021-08-28: qty 2

## 2021-08-28 MED ORDER — MIDAZOLAM HCL 2 MG/2ML IJ SOLN
INTRAMUSCULAR | Status: DC | PRN
  Administered 2021-08-28: 1 mg via INTRAVENOUS

## 2021-08-28 MED ORDER — ONDANSETRON HCL 4 MG/2ML IJ SOLN: 4.0000 mg | Freq: Four times a day (QID) | INTRAMUSCULAR | Status: DC | PRN

## 2021-08-28 MED ORDER — ASPIRIN 81 MG PO CHEW
81.0000 mg | CHEWABLE_TABLET | ORAL | Status: AC
  Administered 2021-08-28: 81 mg via ORAL

## 2021-08-28 MED ORDER — LABETALOL HCL 5 MG/ML IV SOLN: 10.0000 mg | INTRAVENOUS | Status: DC | PRN

## 2021-08-28 MED ORDER — ASPIRIN 81 MG PO CHEW
CHEWABLE_TABLET | ORAL | Status: AC
  Filled 2021-08-28: qty 1

## 2021-08-28 MED ORDER — DEXTROSE 50 % IV SOLN
25.0000 mL | Freq: Once | INTRAVENOUS | Status: AC
  Administered 2021-08-28: 25 mL via INTRAVENOUS
  Filled 2021-08-28: qty 50

## 2021-08-28 MED ORDER — FENTANYL CITRATE (PF) 100 MCG/2ML IJ SOLN
INTRAMUSCULAR | Status: AC
  Filled 2021-08-28: qty 2

## 2021-08-28 MED ORDER — MIDAZOLAM HCL 2 MG/2ML IJ SOLN
INTRAMUSCULAR | Status: AC
  Filled 2021-08-28: qty 2

## 2021-08-28 MED ORDER — LIDOCAINE HCL (PF) 1 % IJ SOLN
INTRAMUSCULAR | Status: AC
  Filled 2021-08-28: qty 30

## 2021-08-28 MED ORDER — HYDRALAZINE HCL 20 MG/ML IJ SOLN: 10.0000 mg | INTRAMUSCULAR | Status: DC | PRN

## 2021-08-28 MED ORDER — HEPARIN SODIUM (PORCINE) 1000 UNIT/ML IJ SOLN
INTRAMUSCULAR | Status: DC | PRN
  Administered 2021-08-28: 5000 [IU] via INTRAVENOUS

## 2021-08-28 MED ORDER — ACETAMINOPHEN 325 MG PO TABS: 650.0000 mg | ORAL_TABLET | ORAL | Status: DC | PRN

## 2021-08-28 MED ORDER — LIDOCAINE HCL (PF) 1 % IJ SOLN
INTRAMUSCULAR | Status: DC | PRN
  Administered 2021-08-28 (×2): 2 mL

## 2021-08-28 MED ORDER — SODIUM CHLORIDE 0.9% FLUSH: 3.0000 mL | Freq: Two times a day (BID) | INTRAVENOUS | Status: DC

## 2021-08-28 MED ORDER — SODIUM CHLORIDE 0.9 % IV SOLN: INTRAVENOUS | Status: DC

## 2021-08-28 SURGICAL SUPPLY — 11 items

## 2021-08-28 NOTE — Interval H&P Note (Signed)
History and Physical Interval Note:  08/28/2021 11:53 AM  William Sawyer  has presented today for surgery, with the diagnosis of aortic stenosis, TAVR workup.  The various methods of treatment have been discussed with the patient and family. After consideration of risks, benefits and other options for treatment, the patient has consented to  Procedure(s): RIGHT/LEFT HEART CATH AND CORONARY ANGIOGRAPHY (N/A) as a surgical intervention.  The patient's history has been reviewed, patient examined, no change in status, stable for surgery.  I have reviewed the patient's chart and labs.  Questions were answered to the patient's satisfaction.     Sherren Mocha

## 2021-08-28 NOTE — Discharge Instructions (Signed)
Radial Site Care  This sheet gives you information about how to care for yourself after your procedure. Your health care provider may also give you more specific instructions. If you have problems or questions, contact your health care provider. What can I expect after the procedure? After the procedure, it is common to have: Bruising and tenderness at the catheter insertion area. Follow these instructions at home: Medicines Take over-the-counter and prescription medicines only as told by your health care provider. Insertion site care Follow instructions from your health care provider about how to take care of your insertion site. Make sure you: Wash your hands with soap and water before you remove your bandage (dressing). If soap and water are not available, use hand sanitizer. May remove dressing in 24 hours. Check your insertion site every day for signs of infection. Check for: Redness, swelling, or pain. Fluid or blood. Pus or a bad smell. Warmth. Do no take baths, swim, or use a hot tub for 5 days. You may shower 24-48 hours after the procedure. Remove the dressing and gently wash the site with plain soap and water. Pat the area dry with a clean towel. Do not rub the site. That could cause bleeding. Do not apply powder or lotion to the site. Activity  For 24 hours after the procedure, or as directed by your health care provider: Do not flex or bend the affected arm. Do not push or pull heavy objects with the affected arm. Do not drive yourself home from the hospital or clinic. You may drive 24 hours after the procedure. Do not operate machinery or power tools. KEEP ARM ELEVATED THE REMAINDER OF THE DAY. Do not push, pull or lift anything that is heavier than 10 lb for 5 days. Ask your health care provider when it is okay to: Return to work or school. Resume usual physical activities or sports. Resume sexual activity. General instructions If the catheter site starts to  bleed, raise your arm and put firm pressure on the site. If the bleeding does not stop, get help right away. This is a medical emergency. DRINK PLENTY OF FLUIDS FOR THE NEXT 2-3 DAYS. No alcohol consumption for 24 hours after receiving sedation. If you went home on the same day as your procedure, a responsible adult should be with you for the first 24 hours after you arrive home. Keep all follow-up visits as told by your health care provider. This is important. Contact a health care provider if: You have a fever. You have redness, swelling, or yellow drainage around your insertion site. Get help right away if: You have unusual pain at the radial site. The catheter insertion area swells very fast. The insertion area is bleeding, and the bleeding does not stop when you hold steady pressure on the area. Your arm or hand becomes pale, cool, tingly, or numb. These symptoms may represent a serious problem that is an emergency. Do not wait to see if the symptoms will go away. Get medical help right away. Call your local emergency services (911 in the [place] Do not drive yourself to the hospital. Summary After the procedure, it is common to have bruising and tenderness at the site. Follow instructions from your health care provider about how to take care of your radial site wound. Check the wound every day for signs of infection.  This information is not intended to replace advice given to you by your health care provider. Make sure you discuss any questions you have with   your health care provider. Document Revised: 01/11/2018 Document Reviewed: 01/11/2018 Elsevier Patient Education  2020 [employer]  

## 2021-08-28 NOTE — Progress Notes (Signed)
Pt's CBG upon arrival this morning was 61. D50 given. CBG recheck is 96.

## 2021-08-28 NOTE — Progress Notes (Signed)
Pt ambulated without difficulty or bleeding.   Discharged home with daughter who will drive and stay with pt x 24 hrs .  Arm board applied

## 2021-08-31 ENCOUNTER — Encounter (HOSPITAL_COMMUNITY): Payer: Self-pay | Admitting: Cardiovascular Disease

## 2021-09-01 NOTE — Progress Notes (Signed)
Cardiology Office Note:    Date:  09/04/2021   ID:  William Sawyer, DOB February 04, 1933, MRN [id number]  PCP:  [name], MD  [place] Cardiologist: Dr. [facility] Electrophysiologist:  None   Referring MD: [name], MD   Chief Complaint: 2-3 week follow-up  History of Present Illness:    William Sawyer is a 87 y.o. male with a hx of HTN, HLD, DM2, gout, permanent afib on Xarelto, HFpEF, severe AS who is being seen for hospital follow-up.    He was admitted for CHF treated with IV lasix. Echo showed LVEF >55%, severely dilated LA, mild MR, mod to severe AS with mean gradient 33mHg. He was interested in TAVR work-up. He was discharged on lasix '40mg'$  daily.    ED visit 8/6 for chest pain, vitals wnl. HS trop 20>18. EKG with afib, 101bpm, CXR right pleural effusion. Pain controlled and he was discharged home. He was referred to the structural heart team for TAVR work-up.   He saw Dr. CBurt Knack8/18/22 for TAVR consideration. Underwent R/L heart cath 08/28/21. This showed nonobstructive CAD, severe AS, normal right heart pressures.   Today, the patient reports he has been doing well since the cardiac cath. Right wrist healing well. He is going back next week for [place]. EKG shows improved heart rate at 91bpm. No chest pain, palpitations. He has chronic SOB. He has been on lasix '60mg'$  daily T,th,Sat, Sun and '80mg'$  WMF. He had some questions regarding metformin, but recommended he speak to his PCP.  Past Medical History:  Diagnosis Date   Atrial fibrillation, chronic (HCC)    CHF (congestive heart failure) ([place]    Diabetes mellitus without complication ([facility]    Gout    HLD (hyperlipidemia)    HTN (hypertension)     Past Surgical History:  Procedure Laterality Date   Left hip replacement     REPLACEMENT TOTAL KNEE BILATERAL Bilateral    RIGHT/LEFT HEART CATH AND CORONARY ANGIOGRAPHY N/A 08/28/2021   Procedure: RIGHT/LEFT HEART CATH AND CORONARY ANGIOGRAPHY;  Surgeon:  CSherren Mocha MD;  [facility];  Service: Cardiovascular;  Laterality: N/A;    Current Medications: Current Meds  Medication Sig   allopurinol (ZYLOPRIM) 300 MG tablet Take 300 mg by mouth daily.   Ferrous Sulfate (IRON PO) Take 1 tablet by mouth every evening.   furosemide (LASIX) 40 MG tablet Take 1.5 tablets (60 mg total) by mouth daily. (Patient taking differently: Take 60-80 mg by mouth See admin instructions. Take 1.5 tablets (60 mg) by mouth on Sundays, Tuesdays, Thursdays & Saturdays Take 2 tablets (80 mg) by mouth on Mondays, Wednesdays & Fridays.)   LANTUS SOLOSTAR 100 UNIT/ML Solostar Pen Inject 5-10 Units into the skin See admin instructions. Inject 10 units subcutaneously in the morning & inject 5 units subcutaneously at night   metFORMIN (GLUCOPHAGE) 1000 MG tablet Take by mouth 2 (two) times daily. 1000 mg in the morning and 500 mg at night.   metoprolol succinate (TOPROL-XL) 100 MG 24 hr tablet Take 1 tablet (100 mg total) by mouth daily. Take with or immediately following a meal.   simvastatin (ZOCOR) 20 MG tablet Take 20 mg by mouth at bedtime.   XARELTO 20 MG TABS tablet Take 20 mg by mouth daily.   [DISCONTINUED] furosemide (LASIX) 20 MG tablet furosemide 20 mg tablet  TAKE 1 TABLET DAILY     Allergies:   Patient has no known allergies.   Social History  Socioeconomic History   Marital status: Married    Spouse name: Not on file   Number of children: Not on file   Years of education: Not on file   Highest education level: Not on file  Occupational History   Not on file  Tobacco Use   Smoking status: Never   Smokeless tobacco: Never  Vaping Use   Vaping Use: Never used  Substance and Sexual Activity   Alcohol use: Not Currently   Drug use: Never   Sexual activity: Not on file  Other Topics Concern   Not on file  Social History Narrative   Not on file   Social Determinants of Health   Financial Resource Strain: Not on file  Food  Insecurity: Not on file  Transportation Needs: Not on file  Physical Activity: Not on file  Stress: Not on file  Social Connections: Not on file     Family History: The patient's family history includes Breast cancer in his mother; Pancreatic cancer in his father.  ROS:   Please see the history of present illness.     All other systems reviewed and are negative.  EKGs/Labs/Other Studies Reviewed:    The following studies were reviewed today:  Echo 07/21/21 1. Left ventricular ejection fraction, by estimation, is >55%. The left  ventricle has normal function. Left ventricular endocardial border not  optimally defined to evaluate regional wall motion. There is mild left  ventricular hypertrophy. Left  ventricular diastolic function could not be evaluated.   2. Right ventricular systolic function is normal. The right ventricular  size is normal.   3. Left atrial size was severely dilated.   4. The mitral valve was not well visualized. Mild mitral valve  regurgitation. No evidence of mitral stenosis. Moderate mitral annular  calcification.   5. The aortic valve was not well visualized. Aortic valve regurgitation  is not visualized. Moderate to severe aortic valve stenosis. Aortic valve  mean gradient measures 38.0 mmHg. Valve area cannot be calculated due to  poor image quality. Dimension less   index (VTI) is 0.26, consistent with borderline severe aortic stenosis.   Cardiac cath 08/28/21   Prox RCA lesion is 40% stenosed.   Mid RCA lesion is 60% stenosed.   There is severe aortic valve stenosis.   1.  Nonobstructive coronary artery disease with moderate mid RCA stenosis, wide patency of the left main, patency of the LAD with mild diffuse plaquing, and patency of the large left circumflex with mild diffuse plaquing 2.  Severe calcific aortic stenosis with peak to peak transvalvular gradient 64 mmHg, mean gradient 55 mmHg, aortic valve area 0.93 cm, aortic valve area index of  0.41 3.  Essentially normal right heart pressures   Recommendations: Continue TAVR evaluation.  Coronary Diagrams  Diagnostic Dominance: Right Intervention  EKG:  EKG is  ordered today.  The ekg ordered today demonstrates Afib 91bpm, nonspecific T wave abnormality  Recent Labs: 07/23/2021: Magnesium 2.0 07/25/2021: ALT 13 07/31/2021: BNP 206.2 08/25/2021: BUN 21; Creatinine, Ser 0.85; Platelets 235 08/28/2021: Hemoglobin 11.6; Hemoglobin 13.9; Potassium 2.7; Potassium 3.1; Sodium 142; Sodium 138  Recent Lipid Panel    Component Value Date/Time   CHOL 103 07/21/2021 0622   TRIG 38 07/21/2021 0622   HDL 35 (L) 07/21/2021 0622   CHOLHDL 2.9 07/21/2021 0622   VLDL 8 07/21/2021 0622   LDLCALC 60 07/21/2021 0622    Physical Exam:    VS:  BP 100/70 (BP Location: Left Arm, Patient Position:  Sitting, Cuff Size: Normal)   Pulse 91   Ht '6\' 1"'$  (1.854 m)   Wt 234 lb (106.1 kg)   SpO2 97%   BMI 30.87 kg/m     Wt Readings from Last 3 Encounters:  09/04/21 234 lb (106.1 kg)  08/28/21 230 lb (104.3 kg)  08/11/21 231 lb 2 oz (104.8 kg)     GEN:  Well nourished, well developed in no acute distress HEENT: Normal NECK: No JVD; No carotid bruits LYMPHATICS: No lymphadenopathy CARDIAC: Irreg Irreg, + murmur, no rubs, gallops RESPIRATORY:  Clear to auscultation without rales, wheezing or rhonchi  ABDOMEN: Soft, non-tender, non-distended MUSCULOSKELETAL:  Trace edema; No deformity  SKIN: Warm and dry NEUROLOGIC:  Alert and oriented x 3 PSYCHIATRIC:  Normal affect   ASSESSMENT:    1. Chronic diastolic heart failure ([place])   2. Medication management   3. SOB (shortness of breath)   4. Severe aortic stenosis   5. Hyperlipidemia, mixed   6. Essential hypertension   7. Atrial fibrillation, chronic (HCC)    PLAN:    In order of problems listed above:  HFpEF Patients fluid status is much improved. CHF clinic changed lasix from 60 mg daily to Lasix '60mg'$  T/Th,/Sat/Sun. lasix '80mg'$  M,W,F.  Trace pedal on exam. I will check BNP and BMET. Continue Toprol.   Severe AS He is undergoing work-up for TAVR. Recent right and left cardiac cath showed nonosbtructive CAD, severe AS, normal right heart pressures.  Right wrist cath site stable. Plan for US carotids next week with follow-up with structural heart team.  Permanent Afib Rates improved with Toprol '100mg'$  daily. Continue a/c with Xarelto.   HTN BP soft, but patient is asymptomatic. Continue Toprol.   HLD LDL 60 in 07/2021. Continue simvastatin.  Disposition: Follow up in 3 month(s) with MD/APP    Signed, Anallely Rosell Ninfa Meeker, PA-C  09/04/2021 2:39 PM    [facility]

## 2021-09-04 ENCOUNTER — Encounter: Payer: Self-pay | Admitting: Medical

## 2021-09-04 ENCOUNTER — Ambulatory Visit: Payer: Medicare Other | Admitting: Medical

## 2021-09-04 ENCOUNTER — Other Ambulatory Visit: Payer: Self-pay

## 2021-09-04 VITALS — BP 100/70 | HR 91 | Ht 73.0 in | Wt 234.0 lb

## 2021-09-04 DIAGNOSIS — I350 Nonrheumatic aortic (valve) stenosis: Secondary | ICD-10-CM | POA: Diagnosis not present

## 2021-09-04 DIAGNOSIS — I5032 Chronic diastolic (congestive) heart failure: Secondary | ICD-10-CM | POA: Diagnosis not present

## 2021-09-04 DIAGNOSIS — I10 Essential (primary) hypertension: Secondary | ICD-10-CM

## 2021-09-04 DIAGNOSIS — R0602 Shortness of breath: Secondary | ICD-10-CM | POA: Diagnosis not present

## 2021-09-04 DIAGNOSIS — E782 Mixed hyperlipidemia: Secondary | ICD-10-CM

## 2021-09-04 DIAGNOSIS — Z79899 Other long term (current) drug therapy: Secondary | ICD-10-CM

## 2021-09-04 DIAGNOSIS — I482 Chronic atrial fibrillation, unspecified: Secondary | ICD-10-CM

## 2021-09-04 NOTE — Patient Instructions (Signed)
Medication Instructions:  Please continue your current medication  *If you need a refill on your cardiac medications before your next appointment, please call your pharmacy*   Lab Work: BMET & BNP LABS WILL APPEAR ON MYCHART, ABNORMAL RESULTS WILL BE CALLED  Testing/Procedures: None  Follow-Up: At [facility], you and your health needs are our priority.  As part of our continuing mission to provide you with exceptional heart care, we have created designated Provider Care Teams.  These Care Teams include your primary Cardiologist (physician) and Advanced Practice Providers (APPs -  Physician Assistants and Nurse Practitioners) who all work together to provide you with the care you need, when you need it.  Your next appointment:   3 month(s)  The format for your next appointment:   In Person  Provider:   Cadence Kathlen Mody, PA-C

## 2021-09-05 LAB — BASIC METABOLIC PANEL
BUN/Creatinine Ratio: 17 (ref 10–24)
BUN: 16 mg/dL (ref 8–27)
CO2: 27 mmol/L (ref 20–29)
Calcium: 9 mg/dL (ref 8.6–10.2)
Chloride: 97 mmol/L (ref 96–106)
Creatinine, Ser: 0.96 mg/dL (ref 0.76–1.27)
Glucose: 73 mg/dL (ref 65–99)
Potassium: 3.7 mmol/L (ref 3.5–5.2)
Sodium: 139 mmol/L (ref 134–144)
eGFR: 77 mL/min/{1.73_m2} (ref >59)

## 2021-09-05 LAB — BRAIN NATRIURETIC PEPTIDE: BNP: 323.1 pg/mL — ABNORMAL HIGH (ref 0.0–100.0)

## 2021-09-08 ENCOUNTER — Telehealth: Payer: Self-pay | Admitting: *Deleted

## 2021-09-08 DIAGNOSIS — I482 Chronic atrial fibrillation, unspecified: Secondary | ICD-10-CM

## 2021-09-08 MED ORDER — FUROSEMIDE 40 MG PO TABS: 80.0000 mg | ORAL_TABLET | Freq: Every day | ORAL | Status: DC

## 2021-09-08 NOTE — Telephone Encounter (Signed)
Attempted to call pt. No answer. Lmtcb.  

## 2021-09-08 NOTE — Telephone Encounter (Signed)
Patient made aware of lab results and Cadence Furth, [place] recommendation to increase Lasix to 80 mg daily.  Repeat lab (bmp) at our office on 09/15/21 @ 2:15pm.  Patient verbalized understanding and voiced appreciation for the call.

## 2021-09-08 NOTE — Telephone Encounter (Signed)
-----   Message from [place], PA-C sent at 09/08/2021  7:57 AM EDT ----- BNP elevated. Lets go to lasix 80mg  daily and BMET in a week.

## 2021-09-08 NOTE — Telephone Encounter (Signed)
Patient returning call for results .   Holding lab appt 9-27 at 215 for patient .

## 2021-09-10 ENCOUNTER — Other Ambulatory Visit: Payer: Self-pay

## 2021-09-10 ENCOUNTER — Ambulatory Visit (HOSPITAL_COMMUNITY)
Admit: 2021-09-10 | Discharge: 2021-09-10 | Disposition: A | Discharge status: Home / Self Care | Payer: Medicare Other | Attending: Cardiovascular Disease | Admitting: Cardiovascular Disease

## 2021-09-10 ENCOUNTER — Ambulatory Visit (HOSPITAL_COMMUNITY)
Admission: RE | Admit: 2021-09-10 | Discharge: 2021-09-10 | Disposition: A | Discharge status: Home / Self Care | Payer: Medicare Other | Source: Ambulatory Visit | Attending: Cardiovascular Disease | Admitting: Cardiovascular Disease

## 2021-09-10 DIAGNOSIS — Z01818 Encounter for other preprocedural examination: Secondary | ICD-10-CM | POA: Diagnosis not present

## 2021-09-10 DIAGNOSIS — I4821 Permanent atrial fibrillation: Secondary | ICD-10-CM | POA: Diagnosis not present

## 2021-09-10 DIAGNOSIS — I700 Atherosclerosis of aorta: Secondary | ICD-10-CM | POA: Insufficient documentation

## 2021-09-10 DIAGNOSIS — J90 Pleural effusion, not elsewhere classified: Secondary | ICD-10-CM | POA: Diagnosis not present

## 2021-09-10 DIAGNOSIS — I350 Nonrheumatic aortic (valve) stenosis: Secondary | ICD-10-CM | POA: Diagnosis not present

## 2021-09-10 DIAGNOSIS — E119 Type 2 diabetes mellitus without complications: Secondary | ICD-10-CM | POA: Insufficient documentation

## 2021-09-10 DIAGNOSIS — I10 Essential (primary) hypertension: Secondary | ICD-10-CM | POA: Insufficient documentation

## 2021-09-10 DIAGNOSIS — I2510 Atherosclerotic heart disease of native coronary artery without angina pectoris: Secondary | ICD-10-CM | POA: Insufficient documentation

## 2021-09-10 DIAGNOSIS — E785 Hyperlipidemia, unspecified: Secondary | ICD-10-CM | POA: Insufficient documentation

## 2021-09-10 DIAGNOSIS — K5730 Diverticulosis of large intestine without perforation or abscess without bleeding: Secondary | ICD-10-CM | POA: Insufficient documentation

## 2021-09-10 MED ORDER — IOHEXOL 350 MG/ML SOLN
100.0000 mL | Freq: Once | INTRAVENOUS | Status: AC | PRN
  Administered 2021-09-10: 100 mL via INTRAVENOUS

## 2021-09-10 MED ORDER — METOPROLOL TARTRATE 5 MG/5ML IV SOLN
5.0000 mg | INTRAVENOUS | Status: DC | PRN
  Administered 2021-09-10: 5 mg via INTRAVENOUS

## 2021-09-10 MED ORDER — METOPROLOL TARTRATE 5 MG/5ML IV SOLN
INTRAVENOUS | Status: AC
  Filled 2021-09-10: qty 10

## 2021-09-10 NOTE — Progress Notes (Signed)
VASCULAR LAB    Carotid duplex has been performed.  See CV proc for preliminary results.   Darin Arndt, RVT 09/10/2021, 9:33 AM

## 2021-09-10 NOTE — Progress Notes (Signed)
CT scan completed. Tolerated well. D/C home by wheelchair. Awake and alert. In no distress

## 2021-09-15 ENCOUNTER — Other Ambulatory Visit: Payer: Medicare Other

## 2021-09-18 ENCOUNTER — Other Ambulatory Visit: Payer: Medicare Other

## 2021-09-22 ENCOUNTER — Other Ambulatory Visit: Payer: Self-pay

## 2021-09-22 ENCOUNTER — Other Ambulatory Visit (INDEPENDENT_AMBULATORY_CARE_PROVIDER_SITE_OTHER): Payer: Medicare Other

## 2021-09-22 DIAGNOSIS — I482 Chronic atrial fibrillation, unspecified: Secondary | ICD-10-CM

## 2021-09-23 ENCOUNTER — Institutional Professional Consult (permissible substitution): Payer: Medicare Other | Admitting: Surgery

## 2021-09-23 ENCOUNTER — Encounter: Payer: Self-pay | Admitting: Surgery

## 2021-09-23 ENCOUNTER — Encounter: Payer: Self-pay | Admitting: Physical Therapy

## 2021-09-23 ENCOUNTER — Ambulatory Visit: Payer: Medicare Other | Attending: Cardiovascular Disease | Admitting: Physical Therapy

## 2021-09-23 VITALS — BP 124/74 | HR 98 | Resp 20 | Ht 73.0 in | Wt 230.0 lb

## 2021-09-23 DIAGNOSIS — I350 Nonrheumatic aortic (valve) stenosis: Secondary | ICD-10-CM | POA: Diagnosis not present

## 2021-09-23 DIAGNOSIS — R2689 Other abnormalities of gait and mobility: Secondary | ICD-10-CM | POA: Insufficient documentation

## 2021-09-23 LAB — BASIC METABOLIC PANEL
BUN/Creatinine Ratio: 17 (ref 10–24)
BUN: 14 mg/dL (ref 8–27)
CO2: 25 mmol/L (ref 20–29)
Calcium: 8.5 mg/dL — ABNORMAL LOW (ref 8.6–10.2)
Chloride: 91 mmol/L — ABNORMAL LOW (ref 96–106)
Creatinine, Ser: 0.83 mg/dL (ref 0.76–1.27)
Glucose: 104 mg/dL — ABNORMAL HIGH (ref 70–99)
Potassium: 4 mmol/L (ref 3.5–5.2)
Sodium: 138 mmol/L (ref 134–144)
eGFR: 86 mL/min/{1.73_m2} (ref >59)

## 2021-09-23 NOTE — Progress Notes (Signed)
Patient ID: William Sawyer, male   DOB: 10-16-1933, 87 y.o.   MRN: [id number]  [facility]IC         [address]       [place] [address]             [phone]          CARDIOTHORACIC SURGERY CONSULTATION REPORT  PCP is [name], MD Referring Provider is Sherren Mocha, MD Primary Cardiologist is None  Reason for consultation: Severe aortic stenosis  HPI:  The patient is an 87 year old gentleman with a history of permanent atrial fibrillation on oral anticoagulation, diabetes, hypertension, hyperlipidemia, and history of a heart murmur for many years who presents with a few year history of exertional fatigue and shortness of breath.  These episodes have typically occurred in spells and come on suddenly.  They are not always been related to exertion.  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  His most recent echocardiogram on 07/21/2021 showed a mean gradient of 38 mm across aortic valve with a dimensionless index of 0.26 consistent with severe aortic stenosis.  The images were suboptimal.  Left ventricular ejection fraction greater than 55% with mild LVH.  There is mild mitral valve regurgitation with no evidence of mitral stenosis.  The left atrium was severely dilated.  He underwent cardiac catheterization on 08/28/2021 showing nonobstructive disease with 60% mid RCA and 40% proximal RCA stenosis.  The mean gradient across the aortic valve was measured at 55 mmHg with a valve area of 0.93 cm.  Peak to peak gradient was 64 mmHg.  Right heart pressures were normal.  The patient is here today with his daughter who lives in [place].  He lives with his wife.  Past Medical History:  Diagnosis Date   Atrial fibrillation, chronic (HCC)    CHF (congestive heart failure) ([place])    Diabetes mellitus without  complication ([place])    Gout    HLD (hyperlipidemia)    HTN (hypertension)     Past Surgical History:  Procedure Laterality Date   Left hip replacement     REPLACEMENT TOTAL KNEE BILATERAL Bilateral    RIGHT/LEFT HEART CATH AND CORONARY ANGIOGRAPHY N/A 08/28/2021   Procedure: RIGHT/LEFT HEART CATH AND CORONARY ANGIOGRAPHY;  Surgeon: Sherren Mocha, MD;  Location: [facility];  Service: Cardiovascular;  Laterality: N/A;    Family History  Problem Relation Age of Onset   Breast cancer Mother    Pancreatic cancer Father     Social History   Socioeconomic History   Marital status: Married    Spouse name: Not on file   Number of children: Not on file   Years of education: Not on file   Highest education level: Not on file  Occupational History   Not on file  Tobacco Use   Smoking status: Never   Smokeless tobacco: Never  Vaping Use   Vaping Use: Never used  Substance and Sexual Activity   Alcohol use: Not Currently   Drug use: Never   Sexual activity: Not on file  Other Topics Concern   Not on file  Social History Narrative   Not on file   Social Determinants of Health   Financial Resource Strain: Not on file  Food Insecurity: Not on file  Transportation Needs: Not on  file  Physical Activity: Not on file  Stress: Not on file  Social Connections: Not on file  Intimate Partner Violence: Not on file    Prior to Admission medications   Medication Sig Start Date End Date Taking? Authorizing Provider  allopurinol (ZYLOPRIM) 300 MG tablet Take 300 mg by mouth daily. 05/15/21  Yes [provider]  Ferrous Sulfate (IRON PO) Take 1 tablet by mouth every evening.   Yes [provider]  furosemide (LASIX) 40 MG tablet Take 2 tablets (80 mg total) by mouth daily. 09/08/21 10/08/21 Yes Furth, Cadence H, PA-C  LANTUS SOLOSTAR 100 UNIT/ML Solostar Pen Inject 5-10 Units into the skin See admin instructions. Inject 10 units subcutaneously in the morning &  inject 5 units subcutaneously at night 07/06/21  Yes [provider]  metFORMIN (GLUCOPHAGE) 1000 MG tablet Take by mouth 2 (two) times daily. 1000 mg in the morning and 500 mg at night. 05/16/21  Yes [provider]  metoprolol succinate (TOPROL-XL) 100 MG 24 hr tablet Take 1 tablet (100 mg total) by mouth daily. Take with or immediately following a meal. 07/31/21 10/29/21 Yes Furth, Cadence H, PA-C  simvastatin (ZOCOR) 20 MG tablet Take 20 mg by mouth at bedtime. 06/04/21  Yes [provider]  XARELTO 20 MG TABS tablet Take 20 mg by mouth daily. 06/04/21  Yes [provider]    Current Outpatient Medications  Medication Sig Dispense Refill   allopurinol (ZYLOPRIM) 300 MG tablet Take 300 mg by mouth daily.     Ferrous Sulfate (IRON PO) Take 1 tablet by mouth every evening.     furosemide (LASIX) 40 MG tablet Take 2 tablets (80 mg total) by mouth daily.     LANTUS SOLOSTAR 100 UNIT/ML Solostar Pen Inject 5-10 Units into the skin See admin instructions. Inject 10 units subcutaneously in the morning & inject 5 units subcutaneously at night     metFORMIN (GLUCOPHAGE) 1000 MG tablet Take by mouth 2 (two) times daily. 1000 mg in the morning and 500 mg at night.     metoprolol succinate (TOPROL-XL) 100 MG 24 hr tablet Take 1 tablet (100 mg total) by mouth daily. Take with or immediately following a meal. 90 tablet 3   simvastatin (ZOCOR) 20 MG tablet Take 20 mg by mouth at bedtime.     XARELTO 20 MG TABS tablet Take 20 mg by mouth daily.     No current facility-administered medications for this visit.    Allergies  Allergen Reactions   Pregabalin Other (See Comments)   Amlodipine Besylate    Diltiazem Hcl       Review of Systems:   General:  normal appetite, + decreased energy, no weight gain, no weight loss, no fever  Cardiac:  no chest pain with exertion, no chest pain at rest, +SOB with mild exertion, + resting SOB, no PND, + orthopnea, no palpitations, +  arrhythmia, + atrial fibrillation, + LE edema, no dizzy spells, no syncope  Respiratory:  + shortness of breath, no home oxygen, no productive cough, no dry cough, no bronchitis, no wheezing, no hemoptysis, no asthma, no pain with inspiration or cough, no sleep apnea, no CPAP at night  GI:   no difficulty swallowing, no reflux, no frequent heartburn, no hiatal hernia, no abdominal pain, no constipation, no diarrhea, no hematochezia, no hematemesis, no melena  GU:   no dysuria,  no frequency, no urinary tract infection, no hematuria, no enlarged prostate, no kidney stones, no kidney disease  Vascular:  + pain suggestive of claudication, no pain in feet, no leg cramps, no varicose veins, no DVT, no non-healing foot ulcer  Neuro:   no stroke, no TIA's, no seizures, no headaches, no temporary blindness one eye,  no slurred speech, no peripheral neuropathy, no chronic pain, + instability of gait, no memory/cognitive dysfunction  Musculoskeletal: + arthritis - primarily involving the knees, + joint swelling, no myalgias, + difficulty walking, + decreased mobility   Skin:   + rash on back, no itching, no skin infections, no pressure sores or ulcerations  Psych:   no anxiety, no depression, no nervousness, no unusual recent stress  Eyes:   no blurry vision, no floaters, no recent vision changes, Does not wear glasses or contacts  ENT:   + hearing loss, no loose or painful teeth, no dentures, last saw dentist 2022  Hematologic:  no easy bruising, no abnormal bleeding, no clotting disorder, no frequent epistaxis  Endocrine:  + diabetes, does check CBG's at home     Physical Exam:   BP 124/74   Pulse 98   Resp 20   Ht 6\' 1"  (1.854 m)   Wt 230 lb (104.3 kg)   SpO2 96% Comment: RA  BMI 30.34 kg/m   General:  Elderly gentleman,  well-appearing  HEENT:  Unremarkable, NCAT, PERLA, EOMI,   Neck:   no JVD, no bruits, no adenopathy   Chest:   clear to auscultation, symmetrical breath sounds, no wheezes, no  rhonchi   CV:   RRR, 3/6 systolic murmur RSB, no diastolic murmur  Abdomen:  soft, non-tender, no masses   Extremities:  warm, well-perfused, pulses palpable at ankle, + lower extremity edema  Rectal/GU  Deferred  Neuro:   Grossly non-focal and symmetrical throughout  Skin:   Clean and dry, no rashes, no breakdown  Diagnostic Tests:  Lab Results: No results for input(s): WBC, HGB, HCT, PLT in the last 72 hours. BMET:  Recent Labs    09/22/21 0855  NA 138  K 4.0  CL 91*  CO2 25  GLUCOSE 104*  BUN 14  CREATININE 0.83  CALCIUM 8.5*     ECHOCARDIOGRAM REPORT         Patient Name:   [name] Date of Exam: 07/21/2021  Medical Rec #:  [id number]     Height:       73.0 in  Accession #:    [id number]    Weight:       260.0 lb  Date of Birth:  1933/07/31    BSA:          2.405 m  Patient Age:    68 years      BP:           100/71 mmHg  Patient Gender: M             HR:           78 bpm.  Exam Location:  [facility]   Procedure: 2D Echo, Color Doppler, Cardiac Doppler and Intracardiac             Opacification Agent   Indications:     I50.31 congestive heart failure-Acute Diastolic     History:         Patient has no prior history of Echocardiogram  examinations.                   Arrythmias:Atrial Fibrillation; Risk  Factors:Hypertension,  Diabetes and Dyslipidemia.     Sonographer:     Charmayne Sheer RDCS (AE)  Referring Phys:  Baker Janus Soledad Gerlach NIU  Diagnosing Phys: Nelva Bush MD      Sonographer Comments: Suboptimal parasternal window, Technically difficult  study due to poor echo windows and suboptimal subcostal window. Image  acquisition challenging due to patient body habitus.  IMPRESSIONS     1. Left ventricular ejection fraction, by estimation, is >55%. The left  ventricle has normal function. Left ventricular endocardial border not  optimally defined to evaluate regional wall motion. There is mild left  ventricular hypertrophy. Left  ventricular  diastolic function could not be evaluated.   2. Right ventricular systolic function is normal. The right ventricular  size is normal.   3. Left atrial size was severely dilated.   4. The mitral valve was not well visualized. Mild mitral valve  regurgitation. No evidence of mitral stenosis. Moderate mitral annular  calcification.   5. The aortic valve was not well visualized. Aortic valve regurgitation  is not visualized. Moderate to severe aortic valve stenosis. Aortic valve  mean gradient measures 38.0 mmHg. Valve area cannot be calculated due to  poor image quality. Dimension less   index (VTI) is 0.26, consistent with borderline severe aortic stenosis.   FINDINGS   Left Ventricle: Left ventricular ejection fraction, by estimation, is  >55%. The left ventricle has normal function. Left ventricular endocardial  border not optimally defined to evaluate regional wall motion. Definity  contrast agent was given IV to  delineate the left ventricular endocardial borders. The left ventricular  internal cavity size was normal in size. There is mild left ventricular  hypertrophy. Left ventricular diastolic function could not be evaluated  due to atrial fibrillation. Left  ventricular diastolic function could not be evaluated.   Right Ventricle: The right ventricular size is normal. Right vetricular  wall thickness was not well visualized. Right ventricular systolic  function is normal.   Left Atrium: Left atrial size was severely dilated.   Right Atrium: Right atrial size was not well visualized.   Pericardium: Trivial pericardial effusion is present.   Mitral Valve: The mitral valve was not well visualized. Moderate mitral  annular calcification. Mild mitral valve regurgitation. No evidence of  mitral valve stenosis. MV peak gradient, 9.6 mmHg. The mean mitral valve  gradient is 4.0 mmHg.   Tricuspid Valve: The tricuspid valve is not well visualized.   Aortic Valve: The aortic valve  was not well visualized. Aortic valve  regurgitation is not visualized. Moderate to severe aortic stenosis is  present. Aortic valve mean gradient measures 38.0 mmHg. Aortic valve peak  gradient measures 57.5 mmHg.   Pulmonic Valve: The pulmonic valve was not well visualized.   Aorta: The aortic root was not well visualized.   IAS/Shunts: The interatrial septum was not well visualized.      LEFT VENTRICLE  PLAX 2D  LVIDd:         3.78 cm     Diastology  LVIDs:         1.56 cm     LV e' medial:    6.09 cm/s  LV PW:         1.17 cm     LV E/e' medial:  23.2  LV IVS:        1.26 cm     LV e' lateral:   6.74 cm/s  LV E/e' lateral: 21.0     LV Volumes (MOD)  LV vol d, MOD A2C: 61.8 ml  LV vol d, MOD A4C: 78.8 ml  LV vol s, MOD A2C: 28.7 ml  LV vol s, MOD A4C: 31.3 ml  LV SV MOD A2C:     33.1 ml  LV SV MOD A4C:     78.8 ml  LV SV MOD BP:      40.6 ml   LEFT ATRIUM            Index  LA Vol (A4C): 127.0 ml 52.80 ml/m   AORTIC VALVE  AV Vmax:           379.00 cm/s  AV Vmean:          276.500 cm/s  AV VTI:            0.806 m  AV Peak Grad:      57.5 mmHg  AV Mean Grad:      38.0 mmHg  LVOT Vmax:         81.70 cm/s  LVOT Vmean:        53.500 cm/s  LVOT VTI:          0.176 m  LVOT/AV VTI ratio: 0.22   MITRAL VALVE  MV Area (PHT): 2.93 cm     SHUNTS  MV Peak grad:  9.6 mmHg     Systemic VTI: 0.18 m  MV Mean grad:  4.0 mmHg  MV Vmax:       1.55 m/s  MV Vmean:      85.7 cm/s  MV Decel Time: 259 msec  MV E velocity: 141.33 cm/s   Nelva Bush MD  Electronically signed by Nelva Bush MD  Signature Date/Time: 07/21/2021/4:35:54 PM         Final     Physicians  Panel Physicians Referring Physician Case Authorizing Physician  Sherren Mocha, MD (Primary)     Procedures  RIGHT/LEFT HEART CATH AND CORONARY ANGIOGRAPHY   Conclusion      Prox RCA lesion is 40% stenosed.   Mid RCA lesion is 60% stenosed.   There is severe aortic  valve stenosis.   1.  Nonobstructive coronary artery disease with moderate mid RCA stenosis, wide patency of the left main, patency of the LAD with mild diffuse plaquing, and patency of the large left circumflex with mild diffuse plaquing 2.  Severe calcific aortic stenosis with peak to peak transvalvular gradient 64 mmHg, mean gradient 55 mmHg, aortic valve area 0.93 cm, aortic valve area index of 0.41 3.  Essentially normal right heart pressures   Recommendations: Continue TAVR evaluation.   Procedural Details  Technical Details INDICATION: Severe symptomatic aortic stenosis  PROCEDURAL DETAILS: There was an indwelling IV in a right antecubital vein. Using normal sterile technique, the IV was changed out for a 5 Fr brachial sheath over a 0.018 inch wire. The right wrist was then prepped, draped, and anesthetized with 1% lidocaine. Using the modified Seldinger technique a 5/6 French Slender sheath was placed in the right radial artery. Intra-arterial verapamil was administered through the radial artery sheath. IV heparin was administered after a JR4 catheter was advanced into the central aorta. A Swan-Ganz catheter was used for the right heart catheterization. Standard protocol was followed for recording of right heart pressures and sampling of oxygen saturations. Fick cardiac output was calculated. Standard Judkins catheters were used for selective coronary angiography.  The aortic valve was crossed with a J-wire and a  JR4 catheter.  LV pressure is recorded and an aortic valve pullback is performed. There were no immediate procedural complications. The patient was transferred to the post catheterization recovery area for further monitoring.      Estimated blood loss <50 mL.   During this procedure medications were administered to achieve and maintain moderate conscious sedation while the patient's heart rate, blood pressure, and oxygen saturation were continuously monitored and I was present  face-to-face 100% of this time.   Medications (Filter: Administrations occurring from 1127 to 1220 on 08/28/21) Heparin (Porcine) in NaCl 1000-0.9 UT/500ML-% SOLN (mL) Total volume:  1,500 mL Date/Time Rate/Dose/Volume Action   08/28/21 1130 500 mL Given   1130 500 mL Given   1130 500 mL Given    fentaNYL (SUBLIMAZE) injection (mcg) Total dose:  25 mcg Date/Time Rate/Dose/Volume Action   08/28/21 1157 25 mcg Given    midazolam (VERSED) injection (mg) Total dose:  1 mg Date/Time Rate/Dose/Volume Action   08/28/21 1157 1 mg Given    lidocaine (PF) (XYLOCAINE) 1 % injection (mL) Total volume:  4 mL Date/Time Rate/Dose/Volume Action   08/28/21 1158 2 mL Given   1158 2 mL Given    Radial Cocktail/Verapamil only (mL) Total volume:  10 mL Date/Time Rate/Dose/Volume Action   08/28/21 1159 10 mL Given    heparin sodium (porcine) injection (Units) Total dose:  5,000 Units Date/Time Rate/Dose/Volume Action   08/28/21 1206 5,000 Units Given    iohexol (OMNIPAQUE) 350 MG/ML injection (mL) Total volume:  55 mL Date/Time Rate/Dose/Volume Action   08/28/21 1219 55 mL Given    Sedation Time  Sedation Time Physician-1: 15 minutes 54 seconds Contrast  Medication Name Total Dose  iohexol (OMNIPAQUE) 350 MG/ML injection 55 mL   Radiation/Fluoro  Fluoro time: 3.5 (min) DAP: 19815 (mGycm2) Cumulative Air Kerma: 382 (mGy) Coronary Findings  Diagnostic Dominance: Right Left Main  Left main is calcified but widely patent. It divides into the LAD and left circumflex. There is no significant stenosis present.  Left Anterior Descending  Vessel is large. There is mild diffuse disease throughout the vessel. The vessel is moderately calcified. The LAD reaches the apex. There is mild diffuse plaquing with no high-grade stenosis. The vessel is diffusely calcified.  Left Circumflex  There is mild diffuse disease throughout the vessel. The circumflex is large in caliber. It gives off a  single obtuse marginal branch that divides into twin branches. There is no stenosis throughout the circumflex. There is mild diffuse plaquing noted.  Right Coronary Artery  Vessel is small. Relatively small caliber RCA with moderate diffuse mid vessel stenosis.  Prox RCA lesion is 40% stenosed.  Mid RCA lesion is 60% stenosed.  Intervention  No interventions have been documented. Left Heart  Aortic Valve There is severe aortic valve stenosis. The aortic valve is calcified. There is restricted aortic valve motion.   Coronary Diagrams  Diagnostic Dominance: Right Intervention  Implants     No implant documentation for this case.   Syngo Images   Show images for CARDIAC CATHETERIZATION Images on Long Term Storage   Show images for Bilal, [place] Link to Procedure Log  Procedure Log    Hemo Data  Flowsheet Row Most Recent Value  Fick Cardiac Output 6.4 L/min  Fick Cardiac Output Index 2.81 (L/min)/BSA  Aortic Mean Gradient 54.8 mmHg  Aortic Peak Gradient 64 mmHg  Aortic Valve Area 0.93  Aortic Value Area Index 0.41 cm2/BSA  RA A Wave 2 mmHg  RA V  Wave 3 mmHg  RA Mean 2 mmHg  RV Systolic Pressure 38 mmHg  RV Diastolic Pressure -4 mmHg  RV EDP 0 mmHg  PA Systolic Pressure 39 mmHg  PA Diastolic Pressure 13 mmHg  PA Mean 24 mmHg  PW A Wave 21 mmHg  PW V Wave 26 mmHg  PW Mean 19 mmHg  AO Systolic Pressure 841 mmHg  AO Diastolic Pressure 59 mmHg  AO Mean 82 mmHg  LV Systolic Pressure 660 mmHg  LV Diastolic Pressure 2 mmHg  LV EDP 7 mmHg  AOp Systolic Pressure 630 mmHg  AOp Diastolic Pressure 64 mmHg  AOp Mean Pressure 90 mmHg  LVp Systolic Pressure 160 mmHg  LVp Diastolic Pressure 0 mmHg  LVp EDP Pressure 7 mmHg  QP/QS 1  TPVR Index 8.55 HRUI  TSVR Index 29.22 HRUI  PVR SVR Ratio 0.06  TPVR/TSVR Ratio 0.29    ADDENDUM REPORT: 09/10/2021 19:59   CLINICAL DATA:  Severe Aortic Stenosis.   EXAM: Cardiac TAVR CT   TECHNIQUE: A non-contrast, gated CT  scan was obtained with axial slices of 3 mm through the heart for aortic valve calcium scoring. A 120 kV retrospective, gated, contrast cardiac scan was obtained. Gantry rotation speed was 250 msecs and collimation was 0.6 mm. Nitroglycerin was not given. The 3D data set was reconstructed in 5% intervals of the 0-95% of the R-R cycle. Systolic and diastolic phases were analyzed on a dedicated workstation using MPR, MIP, and VRT modes. The patient received 100 cc of contrast.   FINDINGS: Image quality: Excellent.   Noise artifact is: Limited.   Valve Morphology: The aortic valve is tricuspid. The leaflets are severely calcified with severely restricted movement in systole. All leaflets contain dense bulky calcifications.   Aortic Valve Calcium score: 6022   Aortic annular dimension:   Phase assessed: 30%   Annular area: 536 mm2   Annular perimeter: 83.1 mm   Max diameter: 28.9 mm   Min diameter: 24.1 mm   Annular and subannular calcification: There is a single protruding calcification under the [place] that extends into the LVOT.   Optimal coplanar projection: RAO 3 CRA 7   Coronary Artery Height above Annulus:   Left Main: 12.8 mm   Right Coronary: 21.4 mm   Sinus of Valsalva Measurements:   Non-coronary: 35 mm   Right-coronary: 34 mm   Left-coronary: 37 mm   Sinus of Valsalva Height:   Non-coronary: 24.7 mm   Right-coronary: 23.4 mm   Left-coronary: 20.7 mm   Sinotubular Junction: 31 mm   Ascending Thoracic Aorta: 35 mm   Coronary Arteries: Normal coronary origin. Right dominance. The study was performed without use of NTG and is insufficient for plaque evaluation. Please refer to recent cardiac catheterization for coronary assessment. 3-vessel coronary calcifications noted.   Cardiac Morphology:   Right Atrium: Right atrial size is dilated.   Right Ventricle: The right ventricular cavity is within normal limits.   Left Atrium: Left atrial size  is dilated. There is likely mixing artifact in the left atrial appendage but cannot exclude thrombus.   Left Ventricle: The ventricular cavity size is within normal limits. There are no stigmata of prior infarction. There is no abnormal filling defect.   Pulmonary arteries: Normal in size without proximal filling defect.   Pulmonary veins: Normal pulmonary venous drainage.   Pericardium: Normal thickness with no significant effusion or calcium present.   Mitral Valve: The mitral valve is degenerative with moderate to severe annular calcium.  Extra-cardiac findings: See attached radiology report for non-cardiac structures.   IMPRESSION: 1. Tricuspid aortic valve with severe calcifications (calcium score = 6022).   2. Annular measurements appropriate for 26 mm S3 (536 mm2).   3. There is a single protruding calcification under the [place] that extends into the LVOT.   4. Sufficient coronary to annulus distance.   5. Optimal Fluoroscopic Angle for Delivery: RAO 3 CRA 7   6. There is likely mixing artifact in the left atrial appendage but cannot exclude thrombus.   7. Moderate to severe mitral annular calcification.   [place] Bells T. Audie Box, MD     Electronically Signed   By: Eleonore Chiquito M.D.   On: 09/10/2021 19:59    Addended by Geralynn Rile, MD on 09/10/2021  8:02 PM   Study Result  Narrative & Impression  EXAM: OVER-READ INTERPRETATION  CT CHEST   The following report is an over-read performed by radiologist Dr. Vinnie Langton of [facility], [place] on 09/10/2021. This over-read does not include interpretation of cardiac or coronary anatomy or pathology. The coronary calcium score/coronary CTA interpretation by the cardiologist is attached.   COMPARISON:  None.   FINDINGS: Extracardiac findings will be described separately under dictation for contemporaneously obtained CTA chest, abdomen and pelvis.   IMPRESSION: Please see separate dictation  for contemporaneously obtained CTA chest, abdomen and pelvis dated 09/10/2021 for full description of relevant extracardiac findings.   Electronically Signed: By: Vinnie Langton M.D. On: 09/10/2021 10:48    Narrative & Impression  CLINICAL DATA:  87 year old male with history of severe aortic stenosis. Preprocedural study prior to potential transcatheter aortic valve replacement (TAVR) procedure.   EXAM: CT ANGIOGRAPHY CHEST, ABDOMEN AND PELVIS   TECHNIQUE: Multidetector CT imaging through the chest, abdomen and pelvis was performed using the standard protocol during bolus administration of intravenous contrast. Multiplanar reconstructed images and MIPs were obtained and reviewed to evaluate the vascular anatomy.   CONTRAST:  14mL OMNIPAQUE IOHEXOL 350 MG/ML SOLN   COMPARISON:  No priors.   FINDINGS: CTA CHEST FINDINGS   Cardiovascular: Heart size is mildly enlarged with biatrial dilatation. There is no significant pericardial fluid, thickening or pericardial calcification. There is aortic atherosclerosis, as well as atherosclerosis of the great vessels of the mediastinum and the coronary arteries, including calcified atherosclerotic plaque in the left main, left anterior descending, left circumflex and right coronary arteries. Severe calcifications of the aortic valve.   Mediastinum/Lymph Nodes: No pathologically enlarged mediastinal or hilar lymph nodes. Please note that accurate exclusion of hilar adenopathy is limited on noncontrast CT scans. Esophagus is unremarkable in appearance. No axillary lymphadenopathy.   Lungs/Pleura: Moderate right and trace left pleural effusions lying dependently. Some passive subsegmental atelectasis is noted in the lower lobes of the lungs bilaterally. No acute consolidative airspace disease. No definite suspicious appearing pulmonary nodules or masses are noted.   Musculoskeletal/Soft Tissues: There are no aggressive  appearing lytic or blastic lesions noted in the visualized portions of the skeleton.   CTA ABDOMEN AND PELVIS FINDINGS   Hepatobiliary: No suspicious cystic or solid hepatic lesions. No intra or extrahepatic biliary ductal dilatation. Gallbladder is normal in appearance.   Pancreas: No pancreatic mass. No pancreatic ductal dilatation. No pancreatic or peripancreatic fluid collections or inflammatory changes.   Spleen: Unremarkable.   Adrenals/Urinary Tract: Bilateral kidneys and bilateral adrenal glands are normal in appearance. No hydroureteronephrosis. Urinary bladder is partially obscured by beam hardening artifact from the patient's left hip arthroplasty. Visualized portions of  the urinary bladder are normal in appearance.   Stomach/Bowel: The appearance of the stomach is normal. No pathologic dilatation of small bowel or colon. Several scattered colonic diverticulae are noted, without surrounding inflammatory changes to suggest an acute diverticulitis at this time. The appendix is not confidently identified and may be surgically absent. Regardless, there are no inflammatory changes noted adjacent to the cecum to suggest the presence of an acute appendicitis at this time.   Vascular/Lymphatic: Aortic atherosclerosis, without evidence of aneurysm or dissection in the abdominal or pelvic vasculature. No lymphadenopathy noted in the abdomen or pelvis.   Reproductive: Prostate gland and seminal vesicles are unremarkable in appearance.   Other: No significant volume of ascites.  No pneumoperitoneum.   Musculoskeletal: Status post left hip arthroplasty. There are no aggressive appearing lytic or blastic lesions noted in the visualized portions of the skeleton.   VASCULAR MEASUREMENTS PERTINENT TO TAVR:   AORTA:   Minimal Aortic Diameter-18 x 16 mm   Severity of Aortic Calcification-severe   RIGHT PELVIS:   Right Common Iliac Artery -   Minimal Diameter-10.6 x 13.1  mm   Tortuosity-severe   Calcification-moderate   Right External Iliac Artery -   Minimal Diameter-9.8 x 9.6 mm   Tortuosity-severe   Calcification-mild   Right Common Femoral Artery -   Minimal Diameter-9.8 x 6.3 mm   Tortuosity-mild   Calcification-moderate   LEFT PELVIS:   Left Common Iliac Artery -   Minimal Diameter-11.9 x 9.3 mm   Tortuosity-very severe   Calcification-moderate   Left External Iliac Artery -   Minimal Diameter-10.6 x 7.3 mm   Tortuosity-moderate   Calcification-mild   Left Common Femoral Artery -   Minimal Diameter-11.3 x 4.7 mm   Tortuosity-mild   Calcification-moderate to severe   Review of the MIP images confirms the above findings.   IMPRESSION: 1. Vascular findings and measurements pertinent to potential TAVR procedure, as detailed above. 2. Severe thickening calcification of the aortic valve, compatible with reported clinical history of severe aortic stenosis. 3. Aortic atherosclerosis, in addition to left main and 3 vessel coronary artery disease. 4. Cardiomegaly with biatrial dilatation 5. Moderate right and trace left pleural effusions lying dependently with some passive subsegmental atelectasis in the lower lobes of the lungs bilaterally (right greater than left). 6. Colonic diverticulosis without evidence of acute diverticulitis at this time. 7. Additional incidental findings, as above.     Electronically Signed   By: Vinnie Langton M.D.   On: 09/10/2021 12:12       STS RISK CALCULATOR: Isolated AVR: Risk of Mortality: 2.359% Renal Failure: 3.653% Permanent Stroke: 1.978% Prolonged Ventilation: 6.377% DSW Infection: 0.161% Reoperation: 3.709% Morbidity or Mortality: 13.104% Short Length of Stay: 22.377% Long Length of Stay: 8.178%    Impression:  This 87 year old gentleman has stage D, severe, symptomatic aortic stenosis with New York Heart Association class II symptoms of exertional  fatigue and shortness of breath consistent with chronic diastolic congestive heart failure.  He had recent hospitalization with acute diastolic congestive heart failure.  I have personally reviewed his 2D echocardiogram, cardiac catheterization, and CTA studies.  His echo images are suboptimal but the aortic valve appears calcified with a mean gradient of 38 mmHg and a peak gradient of 58 mmHg.  Dimensionless index is 0.22 consistent with severe aortic stenosis.  Left ventricular ejection fraction was greater than 55%.  Cardiac catheterization shows moderate nonobstructive RCA stenosis with otherwise no significant disease.  The mean gradient was measured at  55 mmHg with a peak gradient of 64 mmHg and a valve area of 0.93 cm consistent with severe aortic stenosis.  Right heart pressures were normal.  I agree that aortic valve replacement is indicated in this patient for relief of his symptoms and to prevent progressive left ventricular deterioration.  Given his advanced age and limited mobility due to degenerative knee arthritis I think transcatheter aortic valve replacement would be the only option for treating him.  His gated cardiac CTA shows anatomy suitable for TAVR using a 29 mm sapient 3 valve.  His abdominal and pelvic CTA shows adequate pelvic vascular anatomy to allow transfemoral insertion.  The patient and his daughter were counseled at length regarding treatment alternatives for management of severe symptomatic aortic stenosis. The risks and benefits of surgical intervention has been discussed in detail. Long-term prognosis with medical therapy was discussed. Alternative approaches such as conventional surgical aortic valve replacement, transcatheter aortic valve replacement, and palliative medical therapy were compared and contrasted at length. This discussion was placed in the context of the patient's own specific clinical presentation and past medical history. All of their questions have been  addressed.   Following the decision to proceed with transcatheter aortic valve replacement, a discussion was held regarding what types of management strategies would be attempted intraoperatively in the event of life-threatening complications, including whether or not the patient would be considered a candidate for the use of cardiopulmonary bypass and/or conversion to open sternotomy for attempted surgical intervention.  I do not think he is a candidate for emergent sternotomy to manage any intraoperative complications due to his advanced age and limited mobility.  The patient is aware of the fact that transient use of cardiopulmonary bypass may be necessary. The patient has been advised of a variety of complications that might develop including but not limited to risks of death, stroke, paravalvular leak, aortic dissection or other major vascular complications, aortic annulus rupture, device embolization, cardiac rupture or perforation, mitral regurgitation, acute myocardial infarction, arrhythmia, heart block or bradycardia requiring permanent pacemaker placement, congestive heart failure, respiratory failure, renal failure, pneumonia, infection, other late complications related to structural valve deterioration or migration, or other complications that might ultimately cause a temporary or permanent loss of functional independence or other long term morbidity. The patient provides full informed consent for the procedure as described and all questions were answered.      Plan:   He will be scheduled for transfemoral transcatheter aortic valve placement using a SAPIEN 3 valve in the next couple weeks.  He will need to hold his Xarelto for 48 hours preoperatively.  I spent 60 minutes performing this consultation and > 50% of this time was spent face to face counseling and coordinating the care of this patient's severe symptomatic aortic stenosis.    Gaye Pollack, MD 09/23/2021 2:22 PM

## 2021-09-23 NOTE — Therapy (Signed)
[facility], [place], [address] Phone: [phone]   Fax:  [phone]  Physical Therapy Evaluation  Patient Details  Name: [facility] MRN: [id number] Date of Birth: [birth year] Referring Provider (PT): Sherren Mocha MD   Encounter Date: 09/23/2021   PT End of Session - 09/23/21 1149     Visit Number 1    Number of Visits 1    Date for PT Re-Evaluation 09/23/21    PT Start Time 2979    PT Stop Time 8921    PT Time Calculation (min) 30 min    Activity Tolerance Patient tolerated treatment well    Behavior During Therapy [facility] for tasks assessed/performed             Past Medical History:  Diagnosis Date   Atrial fibrillation, chronic (HCC)    CHF (congestive heart failure) ([place])    Diabetes mellitus without complication ([place])    Gout    HLD (hyperlipidemia)    HTN (hypertension)     Past Surgical History:  Procedure Laterality Date   Left hip replacement     REPLACEMENT TOTAL KNEE BILATERAL Bilateral    RIGHT/LEFT HEART CATH AND CORONARY ANGIOGRAPHY N/A 08/28/2021   Procedure: RIGHT/LEFT HEART CATH AND CORONARY ANGIOGRAPHY;  Surgeon: Sherren Mocha, MD;  Location: [place] [facility];  Service: Cardiovascular;  Laterality: N/A;    There were no vitals filed for this visit.    Subjective Assessment - 09/23/21 1152     Subjective pt is a 87 y.o M fatigue and shortness of breath with exertion now for about 3 years, he reports the symptoms fluctuate depending on activity.  bil knee pain due to OA, which he feels is his bigger limitation is the knees. some shortness of breath with tightness in the neck 40%of the time.    Patient Stated Goals to fix heart    Currently in Pain? No/denies                [facility] PT Assessment - 09/23/21 0001       Assessment   Medical Diagnosis Severe Aortic stenosis    Referring Provider (PT) Sherren Mocha MD    Onset Date/Surgical Date --   3 years    Hand Dominance Right    Prior Therapy no      Precautions   Precautions None      Restrictions   Weight Bearing Restrictions No      Balance Screen   Has the patient fallen in the past 6 months No      [place] residence    Living Arrangements Spouse/significant other    Available Help at Discharge Family    Type of [place] Access Level entry    [place] - single point;Walker - 2 wheels   scooter, urninal     Cognition   Overall Cognitive Status Within Functional Limits for tasks assessed      ROM / Strength   AROM / PROM / Strength AROM;Strength      AROM   Overall AROM  Within functional limits for tasks performed      Strength   Overall Strength Within functional limits for tasks performed    Right Hand Grip (lbs) 45    Left Hand Grip (lbs) 48              [facility]  Pre-Surgical Assessment - 09/23/21 0001     5 Meter Walk Test- trial 1 11 sec    5 Meter Walk Test- trial 2 10 sec.     5 Meter Walk Test- trial 3 --   pt couldn't tolerate more than 2 attempts   Average for 2 test 10.5 sec   4 Stage Balance Test tolerated for:  8 sec.    4 Stage Balance Test Position 2    Sit To Stand Test- trial 1 --   unable to perform   ADL/IADL Independent with: Bathing;Dressing;Meal prep;Finances    ADL/IADL Needs Assistance with: [employer] work    ADL/IADL Fraility Index Midly frail    6 Minute Walk- Baseline yes    BP (mmHg) 139/105    HR (bpm) 96    02 Sat (%RA) 97 %    Modified Borg Scale for Dyspnea 2- Mild shortness of breath    Perceived Rate of Exertion (Borg) 9- very light    6 Minute Walk Post Test yes    BP (mmHg) 142/89    HR (bpm) 108    02 Sat (%RA) 98 %    Modified Borg Scale for Dyspnea 3- Moderate shortness of breath or breathing difficulty    Perceived Rate of Exertion (Borg) 16-    Aerobic Endurance Distance Walked 41 ft   Endurance additional comments pt is 97% limited  compared to age related norm                      Objective measurements completed on examination: See above findings.                             Plan - 09/23/21 1219     Clinical Impression Statement see assessment in note    Stability/Clinical Decision Making Stable/Uncomplicated    Clinical Decision Making Low    PT Frequency One time visit    PT Next Visit Plan Pre-TAVR    Consulted and Agree with Plan of Care Patient               Clinical Impression Statement: Pt is a 87 yo M presenting to OP PT for evaluation prior to possible TAVR surgery due to severe aortic stenosis. Pt reports onset of SOB approximately 3 years ago. Symptoms are limiting standing/ walking. Pt presents with good ROM and strength, fair balance and is assessed as moderate at high fall risk 4 stage balance test, limited walking speed and significant limitation with aerobic endurance per 6 minute walk test. Pt ambulated 41 feet in 1:20 before requesting a seated rest beak lasting the remainder of the test. At time of rest, patient's HR was 108 bpm and O2 was 98% on room air. Pt reported 3/10 shortness of breath on modified scale for dyspnea.  Pt ambulated a total of 41 feet in 6 minute walk. SOB, and general fatigue increased significantly with 6 minute walk test. Based on the Short Physical Performance Battery, patient has a frailty rating of 3/12 with </= 5/12 considered frail.   Patient demonstrated the following deficits and impairments:     Visit Diagnosis: Other abnormalities of gait and mobility     Problem List Patient Active Problem List   Diagnosis Date Noted   Aortic valve stenosis    SOB (shortness of breath) 07/20/2021   HTN (hypertension) 07/20/2021   HLD (hyperlipidemia) 07/20/2021   Gout 07/20/2021   Atrial  fibrillation, chronic ([place]) 07/20/2021   UTI (urinary tract infection) 07/20/2021   Diabetes mellitus without complication (HCC)    Elevated  lactic acid level    [name] PT, DPT, LAT, ATC  09/23/21  12:23 PM      [facility] [address] [place], [place], [address] Phone: [phone]   Fax:  [phone]  Name: [facility] MRN: [id number] Date of Birth: June 29, 1933

## 2021-09-24 ENCOUNTER — Other Ambulatory Visit: Payer: Self-pay | Admitting: Physician Assistant

## 2021-09-24 DIAGNOSIS — I350 Nonrheumatic aortic (valve) stenosis: Secondary | ICD-10-CM

## 2021-09-24 NOTE — Addendum Note (Signed)
Addended by: Angelena Form R on: 09/24/2021 11:20 AM   Modules accepted: Orders

## 2021-09-24 NOTE — Progress Notes (Signed)
Surgical Instructions    Your procedure is scheduled on 09/29/21.  Report to [facility] Main Entrance "A" at 05:30 A.M., then check in with the Admitting office.  Call this number if you have problems the morning of surgery:  [phone]   If you have any questions prior to your surgery date call [phone]: Open Monday-Friday 8am-4pm    Remember:  Do not eat or drink after midnight the night before your surgery      --Stop taking your Metformin and Xarelto on 10/9. You will take your last doses on 10/8 (Friday). Continue taking all other medications without change through the day before surgery. On the morning of surgery take no medications.--  As of today, STOP taking any Aspirin (unless otherwise instructed by your surgeon) Aleve, Naproxen, Ibuprofen, Motrin, Advil, Goody's, BC's, all herbal medications, fish oil, and all vitamins.   WHAT DO I DO ABOUT MY DIABETES MEDICATION?   Do not take oral diabetes medicines (pills) the morning of surgery.  THE NIGHT BEFORE SURGERY, take 50% (2 units) of LANTUS SOLOSTAR.     THE MORNING OF SURGERY, take 50% (5 units) of LANTUS SOLOSTAR.  The day of surgery, do not take other diabetes injectables, including Byetta (exenatide), Bydureon (exenatide ER), Victoza (liraglutide), or Trulicity (dulaglutide).  If your CBG is greater than 220 mg/dL, you may take  of your sliding scale (correction) dose of insulin.   HOW TO MANAGE YOUR DIABETES BEFORE AND AFTER SURGERY  Why is it important to control my blood sugar before and after surgery? Improving blood sugar levels before and after surgery helps healing and can limit problems. A way of improving blood sugar control is eating a healthy diet by:  Eating less sugar and carbohydrates  Increasing activity/exercise  Talking with your doctor about reaching your blood sugar goals High blood sugars (greater than 180 mg/dL) can raise your risk of infections and slow your recovery, so you will  need to focus on controlling your diabetes during the weeks before surgery. Make sure that the doctor who takes care of your diabetes knows about your planned surgery including the date and location.  How do I manage my blood sugar before surgery? Check your blood sugar at least 4 times a day, starting 2 days before surgery, to make sure that the level is not too high or low.  Check your blood sugar the morning of your surgery when you wake up and every 2 hours until you get to the Short Stay unit.  If your blood sugar is less than 70 mg/dL, you will need to treat for low blood sugar: Do not take insulin. Treat a low blood sugar (less than 70 mg/dL) with  cup of clear juice (cranberry or apple), 4 glucose tablets, OR glucose gel. Recheck blood sugar in 15 minutes after treatment (to make sure it is greater than 70 mg/dL). If your blood sugar is not greater than 70 mg/dL on recheck, call [phone] for further instructions. Report your blood sugar to the short stay nurse when you get to Short Stay.  If you are admitted to the hospital after surgery: Your blood sugar will be checked by the staff and you will probably be given insulin after surgery (instead of oral diabetes medicines) to make sure you have good blood sugar levels. The goal for blood sugar control after surgery is 80-180 mg/dL.   After your COVID test   You are not required to quarantine however you are required to wear a  well-fitting mask when you are out and around people not in your household.  If your mask becomes wet or soiled, replace with a new one.  Wash your hands often with soap and water for 20 seconds or clean your hands with an alcohol-based hand sanitizer that contains at least 60% alcohol.  Do not share personal items.  Notify your provider: if you are in close contact with someone who has COVID  or if you develop a fever of 100.4 or greater, sneezing, cough, sore throat, shortness of breath or body  aches.             Do not wear jewelry or makeup Do not wear lotions, powders, perfumes/colognes, or deodorant. Men may shave face and neck. Do not bring valuables to the hospital.  DO Not wear nail polish, gel polish, artificial nails, or any other type of covering on natural nails including finger and toenails. If patients have artificial nails, gel coating, etc. that need to be removed by a nail salon, please have this removed prior to surgery or surgery may need to be canceled/delayed if the surgeon/ anesthesia feels like the patient is unable to be adequately monitored.             [facility] is not responsible for any belongings or valuables.  Do NOT Smoke (Tobacco/Vaping)  24 hours prior to your procedure  If you use a CPAP at night, you may bring your mask for your overnight stay.   Contacts, glasses, hearing aids, dentures or partials may not be worn into surgery, please bring cases for these belongings   For patients admitted to the hospital, discharge time will be determined by your treatment team.   Patients discharged the day of surgery will not be allowed to drive home, and someone needs to stay with them for 24 hours.  NO VISITORS WILL BE ALLOWED IN PRE-OP WHERE PATIENTS ARE PREPPED FOR SURGERY.  ONLY 1 SUPPORT PERSON MAY BE PRESENT IN THE WAITING ROOM WHILE YOU ARE IN SURGERY.  IF YOU ARE TO BE ADMITTED, ONCE YOU ARE IN YOUR ROOM YOU WILL BE ALLOWED TWO (2) VISITORS. 1 (ONE) VISITOR MAY STAY OVERNIGHT BUT MUST ARRIVE TO THE ROOM BY 8pm.  Minor children may have two parents present. Special consideration for safety and communication needs will be reviewed on a case by case basis.  Special instructions:    Oral Hygiene is also important to reduce your risk of infection.  Remember - BRUSH YOUR TEETH THE MORNING OF SURGERY WITH YOUR REGULAR TOOTHPASTE   - Preparing For Surgery  Before surgery, you can play an important role. Because skin is not sterile, your  skin needs to be as free of germs as possible. You can reduce the number of germs on your skin by washing with CHG (chlorahexidine gluconate) Soap before surgery.  CHG is an antiseptic cleaner which kills germs and bonds with the skin to continue killing germs even after washing.     Please do not use if you have an allergy to CHG or antibacterial soaps. If your skin becomes reddened/irritated stop using the CHG.  Do not shave (including legs and underarms) for at least 48 hours prior to first CHG shower. It is OK to shave your face.  Please follow these instructions carefully.     Shower the NIGHT BEFORE SURGERY and the MORNING OF SURGERY with CHG Soap.   If you chose to wash your hair, wash your hair first as usual  with your normal shampoo. After you shampoo, rinse your hair and body thoroughly to remove the shampoo.  Then [employer] and genitals (private parts) with your normal soap and rinse thoroughly to remove soap.  After that Use CHG Soap as you would any other liquid soap. You can apply CHG directly to the skin and wash gently with a scrungie or a clean washcloth.   Apply the CHG Soap to your body ONLY FROM THE NECK DOWN.  Do not use on open wounds or open sores. Avoid contact with your eyes, ears, mouth and genitals (private parts). Wash Face and genitals (private parts)  with your normal soap.   Wash thoroughly, paying special attention to the area where your surgery will be performed.  Thoroughly rinse your body with warm water from the neck down.  DO NOT shower/wash with your normal soap after using and rinsing off the CHG Soap.  Pat yourself dry with a CLEAN TOWEL.  Wear CLEAN PAJAMAS to bed the night before surgery  Place CLEAN SHEETS on your bed the night before your surgery  DO NOT SLEEP WITH PETS.   Day of Surgery: Take a shower with CHG soap. Wear Clean/Comfortable clothing the morning of surgery Do not apply any deodorants/lotions.   Remember to brush your teeth  WITH YOUR REGULAR TOOTHPASTE.   Please read over the following fact sheets that you were given.

## 2021-09-25 ENCOUNTER — Encounter (HOSPITAL_COMMUNITY): Payer: Self-pay

## 2021-09-25 ENCOUNTER — Other Ambulatory Visit: Payer: Self-pay

## 2021-09-25 ENCOUNTER — Encounter (HOSPITAL_COMMUNITY)
Admission: RE | Admit: 2021-09-25 | Discharge: 2021-09-25 | Disposition: A | Discharge status: Home / Self Care | Payer: Medicare Other | Source: Ambulatory Visit | Attending: Cardiovascular Disease | Admitting: Cardiovascular Disease

## 2021-09-25 ENCOUNTER — Ambulatory Visit (HOSPITAL_COMMUNITY)
Admission: RE | Admit: 2021-09-25 | Discharge: 2021-09-25 | Disposition: A | Discharge status: Home / Self Care | Payer: Medicare Other | Source: Ambulatory Visit | Attending: Physician Assistant | Admitting: Physician Assistant

## 2021-09-25 ENCOUNTER — Other Ambulatory Visit: Payer: Self-pay | Admitting: Physician Assistant

## 2021-09-25 DIAGNOSIS — J90 Pleural effusion, not elsewhere classified: Secondary | ICD-10-CM | POA: Diagnosis not present

## 2021-09-25 DIAGNOSIS — Z01811 Encounter for preprocedural respiratory examination: Secondary | ICD-10-CM

## 2021-09-25 DIAGNOSIS — I350 Nonrheumatic aortic (valve) stenosis: Secondary | ICD-10-CM

## 2021-09-25 DIAGNOSIS — Z20822 Contact with and (suspected) exposure to covid-19: Secondary | ICD-10-CM | POA: Insufficient documentation

## 2021-09-25 DIAGNOSIS — Z01818 Encounter for other preprocedural examination: Secondary | ICD-10-CM | POA: Diagnosis not present

## 2021-09-25 HISTORY — DX: Dyspnea, unspecified: R06.00

## 2021-09-25 HISTORY — DX: Cardiac arrhythmia, unspecified: I49.9

## 2021-09-25 HISTORY — DX: Personal history of urinary calculi: Z87.442

## 2021-09-25 LAB — BLOOD GAS, ARTERIAL
Acid-Base Excess: 7.3 mmol/L — ABNORMAL HIGH (ref 0.0–2.0)
Bicarbonate: 31.1 mmol/L — ABNORMAL HIGH (ref 20.0–28.0)
Drawn by: 602861
FIO2: 21
O2 Saturation: 96.6 %
Patient temperature: 37
pCO2 arterial: 42.7 mmHg (ref 32.0–48.0)
pH, Arterial: 7.475 — ABNORMAL HIGH (ref 7.350–7.450)
pO2, Arterial: 82.7 mmHg — ABNORMAL LOW (ref 83.0–108.0)

## 2021-09-25 LAB — COMPREHENSIVE METABOLIC PANEL
ALT: 10 U/L (ref 0–44)
AST: 19 U/L (ref 15–41)
Albumin: 3.3 g/dL — ABNORMAL LOW (ref 3.5–5.0)
Alkaline Phosphatase: 61 U/L (ref 38–126)
Anion gap: 12 (ref 5–15)
BUN: 15 mg/dL (ref 8–23)
CO2: 27 mmol/L (ref 22–32)
Calcium: 8.4 mg/dL — ABNORMAL LOW (ref 8.9–10.3)
Chloride: 95 mmol/L — ABNORMAL LOW (ref 98–111)
Creatinine, Ser: 0.87 mg/dL (ref 0.61–1.24)
GFR, Estimated: >60 mL/min (ref >60)
Glucose, Bld: 127 mg/dL — ABNORMAL HIGH (ref 70–99)
Potassium: 3.5 mmol/L (ref 3.5–5.1)
Sodium: 134 mmol/L — ABNORMAL LOW (ref 135–145)
Total Bilirubin: 0.8 mg/dL (ref 0.3–1.2)
Total Protein: 7.3 g/dL (ref 6.5–8.1)

## 2021-09-25 LAB — CBC
HCT: 42.1 % (ref 39.0–52.0)
Hemoglobin: 14 g/dL (ref 13.0–17.0)
MCH: 32 pg (ref 26.0–34.0)
MCHC: 33.3 g/dL (ref 30.0–36.0)
MCV: 96.3 fL (ref 80.0–100.0)
Platelets: 269 10*3/uL (ref 150–400)
RBC: 4.37 MIL/uL (ref 4.22–5.81)
RDW: 13.8 % (ref 11.5–15.5)
WBC: 13.1 10*3/uL — ABNORMAL HIGH (ref 4.0–10.5)
nRBC: 0 % (ref 0.0–0.2)

## 2021-09-25 LAB — PROTIME-INR
INR: 2.5 — ABNORMAL HIGH (ref 0.8–1.2)
Prothrombin Time: 27.1 seconds — ABNORMAL HIGH (ref 11.4–15.2)

## 2021-09-25 LAB — URINALYSIS, ROUTINE W REFLEX MICROSCOPIC
Bilirubin Urine: NEGATIVE
Glucose, UA: NEGATIVE mg/dL
Ketones, ur: NEGATIVE mg/dL
Nitrite: POSITIVE — AB
Protein, ur: NEGATIVE mg/dL
Specific Gravity, Urine: 1.01 (ref 1.005–1.030)
WBC, UA: >50 WBC/hpf — ABNORMAL HIGH (ref 0–5)
pH: 5 (ref 5.0–8.0)

## 2021-09-25 LAB — GLUCOSE, CAPILLARY: Glucose-Capillary: 144 mg/dL — ABNORMAL HIGH (ref 70–99)

## 2021-09-25 LAB — SURGICAL PCR SCREEN
MRSA, PCR: NEGATIVE
Staphylococcus aureus: NEGATIVE

## 2021-09-25 LAB — TYPE AND SCREEN
ABO/RH(D): O POS
Antibody Screen: NEGATIVE

## 2021-09-25 LAB — HEMOGLOBIN A1C
Hgb A1c MFr Bld: 6.2 % — ABNORMAL HIGH (ref 4.8–5.6)
Mean Plasma Glucose: 131.24 mg/dL

## 2021-09-25 LAB — SARS CORONAVIRUS 2 (TAT 6-24 HRS): SARS Coronavirus 2: NEGATIVE

## 2021-09-25 MED ORDER — SULFAMETHOXAZOLE-TRIMETHOPRIM 800-160 MG PO TABS: 1.0000 | ORAL_TABLET | Freq: Two times a day (BID) | ORAL | 0 refills | Status: DC

## 2021-09-25 NOTE — Progress Notes (Addendum)
PCP - Benita Stabile Cardiologist - denies before seeing dr cooper  PPM/ICD - denies   Chest x-ray - 09/25/21 EKG - 09/04/21 Stress Test - over 20 years ago,  ECHO - 07/21/21 Cardiac Cath - 08/28/21  Sleep Study - denies   Fasting Blood Sugar - 100 Checks Blood Sugar 4 times a day  Stop taking your Metformin and Xarelto on 10/9. You will take your last doses on 10/8 (Friday). Continue taking all other medications without change through the day before surgery. On the morning of surgery take no medications.--    ERAS Protcol -no   COVID TEST- 09/25/21 in PAT   Anesthesia review: yes, cardiac surgery.   Patient denies shortness of breath, fever, cough and chest pain at PAT appointment   All instructions explained to the patient, with a verbal understanding of the material. Patient agrees to go over the instructions while at home for a better understanding. Patient also instructed to self quarantine after being tested for COVID-19. The opportunity to ask questions was provided.

## 2021-09-25 NOTE — Progress Notes (Signed)
Notified Angelena Form, PA-C of abnormal PT-INR and urinalysis results.

## 2021-09-28 MED ORDER — POTASSIUM CHLORIDE 2 MEQ/ML IV SOLN
80.0000 meq | INTRAVENOUS | Status: DC
  Filled 2021-09-28: qty 40

## 2021-09-28 MED ORDER — DEXMEDETOMIDINE HCL IN NACL 400 MCG/100ML IV SOLN
0.1000 ug/kg/h | INTRAVENOUS | Status: AC
  Administered 2021-09-29: .5 ug/kg/h via INTRAVENOUS
  Administered 2021-09-29: 104.12 ug via INTRAVENOUS
  Filled 2021-09-28: qty 100

## 2021-09-28 MED ORDER — MAGNESIUM SULFATE 50 % IJ SOLN
40.0000 meq | INTRAMUSCULAR | Status: DC
Start: 2021-09-29 — End: 2021-09-29
  Filled 2021-09-28: qty 9.85

## 2021-09-28 MED ORDER — HEPARIN 30,000 UNITS/1000 ML (OHS) CELLSAVER SOLUTION
Status: DC
  Filled 2021-09-28: qty 1000

## 2021-09-28 MED ORDER — NOREPINEPHRINE 4 MG/250ML-% IV SOLN
0.0000 ug/min | INTRAVENOUS | Status: AC
  Administered 2021-09-29: 2 ug/min via INTRAVENOUS
  Filled 2021-09-28: qty 250

## 2021-09-28 MED ORDER — CEFAZOLIN SODIUM-DEXTROSE 2-4 GM/100ML-% IV SOLN
2.0000 g | INTRAVENOUS | Status: AC
  Administered 2021-09-29: 2 g via INTRAVENOUS
  Filled 2021-09-28 (×2): qty 100

## 2021-09-28 NOTE — H&P (Signed)
[address]       [place] [address]             [phone]      Cardiothoracic Surgery Admission History and Physical   PCP is [name], MD Referring Provider is Sherren Mocha, MD Primary Cardiologist is None   Reason for admission: Severe aortic stenosis   HPI:   The patient is an 87 year old gentleman with a history of permanent atrial fibrillation on oral anticoagulation, diabetes, hypertension, hyperlipidemia, and history of a heart murmur for many years who presents with a few year history of exertional fatigue and shortness of breath.  These episodes have typically occurred in spells and come on suddenly.  They are not always been related to exertion.  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  His most recent echocardiogram on 07/21/2021 showed a mean gradient of 38 mm across aortic valve with a dimensionless index of 0.26 consistent with severe aortic stenosis.  The images were suboptimal.  Left ventricular ejection fraction greater than 55% with mild LVH.  There is mild mitral valve regurgitation with no evidence of mitral stenosis.  The left atrium was severely dilated.  He underwent cardiac catheterization on 08/28/2021 showing nonobstructive disease with 60% mid RCA and 40% proximal RCA stenosis.  The mean gradient across the aortic valve was measured at 55 mmHg with a valve area of 0.93 cm.  Peak to peak gradient was 64 mmHg.  Right heart pressures were normal.   The patient is here today with his daughter who lives in [place].  He lives with his wife.       Past Medical History:  Diagnosis Date   Atrial fibrillation, chronic (HCC)     CHF (congestive heart failure) ([place])     Diabetes mellitus without complication ([place])     Gout     HLD (hyperlipidemia)     HTN (hypertension)             Past Surgical History:  Procedure  Laterality Date   Left hip replacement       REPLACEMENT TOTAL KNEE BILATERAL Bilateral     RIGHT/LEFT HEART CATH AND CORONARY ANGIOGRAPHY N/A 08/28/2021    Procedure: RIGHT/LEFT HEART CATH AND CORONARY ANGIOGRAPHY;  Surgeon: Sherren Mocha, MD;  Location: [facility];  Service: Cardiovascular;  Laterality: N/A;           Family History  Problem Relation Age of Onset   Breast cancer Mother     Pancreatic cancer Father        Social History         Socioeconomic History   Marital status: Married      Spouse name: Not on file   Number of children: Not on file   Years of education: Not on file   Highest education level: Not on file  Occupational History   Not on file  Tobacco Use   Smoking status: Never   Smokeless tobacco: Never  Vaping Use   Vaping Use: Never used  Substance and Sexual Activity   Alcohol use: Not Currently   Drug use: Never   Sexual activity: Not on file  Other Topics Concern   Not on file  Social History Narrative   Not on file    Social Determinants of Health    Financial  Resource Strain: Not on file  Food Insecurity: Not on file  Transportation Needs: Not on file  Physical Activity: Not on file  Stress: Not on file  Social Connections: Not on file  Intimate Partner Violence: Not on file             Prior to Admission medications   Medication Sig Start Date End Date Taking? Authorizing Provider  allopurinol (ZYLOPRIM) 300 MG tablet Take 300 mg by mouth daily. 05/15/21   Yes [provider]  Ferrous Sulfate (IRON PO) Take 1 tablet by mouth every evening.     Yes [provider]  furosemide (LASIX) 40 MG tablet Take 2 tablets (80 mg total) by mouth daily. 09/08/21 10/08/21 Yes Furth, Cadence H, PA-C  LANTUS SOLOSTAR 100 UNIT/ML Solostar Pen Inject 5-10 Units into the skin See admin instructions. Inject 10 units subcutaneously in the morning & inject 5 units subcutaneously at night 07/06/21   Yes [provider]   metFORMIN (GLUCOPHAGE) 1000 MG tablet Take by mouth 2 (two) times daily. 1000 mg in the morning and 500 mg at night. 05/16/21   Yes [provider]  metoprolol succinate (TOPROL-XL) 100 MG 24 hr tablet Take 1 tablet (100 mg total) by mouth daily. Take with or immediately following a meal. 07/31/21 10/29/21 Yes Furth, Cadence H, PA-C  simvastatin (ZOCOR) 20 MG tablet Take 20 mg by mouth at bedtime. 06/04/21   Yes [provider]  XARELTO 20 MG TABS tablet Take 20 mg by mouth daily. 06/04/21   Yes [provider]            Current Outpatient Medications  Medication Sig Dispense Refill   allopurinol (ZYLOPRIM) 300 MG tablet Take 300 mg by mouth daily.       Ferrous Sulfate (IRON PO) Take 1 tablet by mouth every evening.       furosemide (LASIX) 40 MG tablet Take 2 tablets (80 mg total) by mouth daily.       LANTUS SOLOSTAR 100 UNIT/ML Solostar Pen Inject 5-10 Units into the skin See admin instructions. Inject 10 units subcutaneously in the morning & inject 5 units subcutaneously at night       metFORMIN (GLUCOPHAGE) 1000 MG tablet Take by mouth 2 (two) times daily. 1000 mg in the morning and 500 mg at night.       metoprolol succinate (TOPROL-XL) 100 MG 24 hr tablet Take 1 tablet (100 mg total) by mouth daily. Take with or immediately following a meal. 90 tablet 3   simvastatin (ZOCOR) 20 MG tablet Take 20 mg by mouth at bedtime.       XARELTO 20 MG TABS tablet Take 20 mg by mouth daily.        No current facility-administered medications for this visit.          Allergies  Allergen Reactions   Pregabalin Other (See Comments)   Amlodipine Besylate     Diltiazem Hcl            Review of Systems:               General:                      normal appetite, + decreased energy, no weight gain, no weight loss, no fever             Cardiac:  no chest pain with exertion, no chest pain at rest, +SOB with mild exertion, + resting SOB, no PND, +  orthopnea, no palpitations, + arrhythmia, + atrial fibrillation, + LE edema, no dizzy spells, no syncope             Respiratory:                 + shortness of breath, no home oxygen, no productive cough, no dry cough, no bronchitis, no wheezing, no hemoptysis, no asthma, no pain with inspiration or cough, no sleep apnea, no CPAP at night             GI:                               no difficulty swallowing, no reflux, no frequent heartburn, no hiatal hernia, no abdominal pain, no constipation, no diarrhea, no hematochezia, no hematemesis, no melena             GU:                              no dysuria,  no frequency, no urinary tract infection, no hematuria, no enlarged prostate, no kidney stones, no kidney disease             Vascular:                     + pain suggestive of claudication, no pain in feet, no leg cramps, no varicose veins, no DVT, no non-healing foot ulcer             Neuro:                         no stroke, no TIA's, no seizures, no headaches, no temporary blindness one eye,  no slurred speech, no peripheral neuropathy, no chronic pain, + instability of gait, no memory/cognitive dysfunction             Musculoskeletal:         + arthritis - primarily involving the knees, + joint swelling, no myalgias, + difficulty walking, + decreased mobility              Skin:                            + rash on back, no itching, no skin infections, no pressure sores or ulcerations             Psych:                         no anxiety, no depression, no nervousness, no unusual recent stress             Eyes:                           no blurry vision, no floaters, no recent vision changes, Does not wear glasses or contacts             ENT:                            + hearing loss, no loose or painful teeth, no dentures, last saw dentist 2022  Hematologic:               no easy bruising, no abnormal bleeding, no clotting disorder, no frequent epistaxis             Endocrine:                    + diabetes, does check CBG's at home                            Physical Exam:               BP 124/74   Pulse 98   Resp 20   Ht 6\' 1"  (1.854 m)   Wt 230 lb (104.3 kg)   SpO2 96% Comment: RA  BMI 30.34 kg/m              General:                      Elderly gentleman,  well-appearing             HEENT:                       Unremarkable, NCAT, PERLA, EOMI,              Neck:                           no JVD, no bruits, no adenopathy              Chest:                          clear to auscultation, symmetrical breath sounds, no wheezes, no rhonchi              CV:                              RRR, 3/6 systolic murmur RSB, no diastolic murmur             Abdomen:                    soft, non-tender, no masses              Extremities:                 warm, well-perfused, pulses palpable at ankle, + lower extremity edema             Rectal/GU                   Deferred             Neuro:                         Grossly non-focal and symmetrical throughout             Skin:                            Clean and dry, no rashes, no breakdown   Diagnostic Tests:   Lab Results: Recent Labs (last 2 labs)   No results for input(s): WBC, HGB, HCT, PLT in the last 72 hours.   BMET:  Recent Labs (last 2  labs)      Recent Labs    09/22/21 0855  NA 138  K 4.0  CL 91*  CO2 25  GLUCOSE 104*  BUN 14  CREATININE 0.83  CALCIUM 8.5*        ECHOCARDIOGRAM REPORT         Patient Name:   [name] Date of Exam: 07/21/2021  Medical Rec #:  [id number]     Height:       73.0 in  Accession #:    [id number]    Weight:       260.0 lb  Date of Birth:  April 03, 1933    BSA:          2.405 m  Patient Age:    15 years      BP:           100/71 mmHg  Patient Gender: M             HR:           78 bpm.  Exam Location:  [facility]   Procedure: 2D Echo, Color Doppler, Cardiac Doppler and Intracardiac             Opacification Agent   Indications:     I50.31 congestive heart failure-Acute  Diastolic     History:         Patient has no prior history of Echocardiogram  examinations.                   Arrythmias:Atrial Fibrillation; Risk  Factors:Hypertension,                   Diabetes and Dyslipidemia.     Sonographer:     Charmayne Sheer RDCS (AE)  Referring Phys:  Baker Janus Soledad Gerlach NIU  Diagnosing Phys: Nelva Bush MD      Sonographer Comments: Suboptimal parasternal window, Technically difficult  study due to poor echo windows and suboptimal subcostal window. Image  acquisition challenging due to patient body habitus.  IMPRESSIONS     1. Left ventricular ejection fraction, by estimation, is >55%. The left  ventricle has normal function. Left ventricular endocardial border not  optimally defined to evaluate regional wall motion. There is mild left  ventricular hypertrophy. Left  ventricular diastolic function could not be evaluated.   2. Right ventricular systolic function is normal. The right ventricular  size is normal.   3. Left atrial size was severely dilated.   4. The mitral valve was not well visualized. Mild mitral valve  regurgitation. No evidence of mitral stenosis. Moderate mitral annular  calcification.   5. The aortic valve was not well visualized. Aortic valve regurgitation  is not visualized. Moderate to severe aortic valve stenosis. Aortic valve  mean gradient measures 38.0 mmHg. Valve area cannot be calculated due to  poor image quality. Dimension less   index (VTI) is 0.26, consistent with borderline severe aortic stenosis.   FINDINGS   Left Ventricle: Left ventricular ejection fraction, by estimation, is  >55%. The left ventricle has normal function. Left ventricular endocardial  border not optimally defined to evaluate regional wall motion. Definity  contrast agent was given IV to  delineate the left ventricular endocardial borders. The left ventricular  internal cavity size was normal in size. There is mild left ventricular  hypertrophy. Left  ventricular diastolic function could not be evaluated  due to atrial fibrillation. Left  ventricular diastolic function could not be evaluated.   Right Ventricle: The right ventricular  size is normal. Right vetricular  wall thickness was not well visualized. Right ventricular systolic  function is normal.   Left Atrium: Left atrial size was severely dilated.   Right Atrium: Right atrial size was not well visualized.   Pericardium: Trivial pericardial effusion is present.   Mitral Valve: The mitral valve was not well visualized. Moderate mitral  annular calcification. Mild mitral valve regurgitation. No evidence of  mitral valve stenosis. MV peak gradient, 9.6 mmHg. The mean mitral valve  gradient is 4.0 mmHg.   Tricuspid Valve: The tricuspid valve is not well visualized.   Aortic Valve: The aortic valve was not well visualized. Aortic valve  regurgitation is not visualized. Moderate to severe aortic stenosis is  present. Aortic valve mean gradient measures 38.0 mmHg. Aortic valve peak  gradient measures 57.5 mmHg.   Pulmonic Valve: The pulmonic valve was not well visualized.   Aorta: The aortic root was not well visualized.   IAS/Shunts: The interatrial septum was not well visualized.      LEFT VENTRICLE  PLAX 2D  LVIDd:         3.78 cm     Diastology  LVIDs:         1.56 cm     LV e' medial:    6.09 cm/s  LV PW:         1.17 cm     LV E/e' medial:  23.2  LV IVS:        1.26 cm     LV e' lateral:   6.74 cm/s                             LV E/e' lateral: 21.0     LV Volumes (MOD)  LV vol d, MOD A2C: 61.8 ml  LV vol d, MOD A4C: 78.8 ml  LV vol s, MOD A2C: 28.7 ml  LV vol s, MOD A4C: 31.3 ml  LV SV MOD A2C:     33.1 ml  LV SV MOD A4C:     78.8 ml  LV SV MOD BP:      40.6 ml   LEFT ATRIUM            Index  LA Vol (A4C): 127.0 ml 52.80 ml/m   AORTIC VALVE  AV Vmax:           379.00 cm/s  AV Vmean:          276.500 cm/s  AV VTI:            0.806 m  AV Peak Grad:       57.5 mmHg  AV Mean Grad:      38.0 mmHg  LVOT Vmax:         81.70 cm/s  LVOT Vmean:        53.500 cm/s  LVOT VTI:          0.176 m  LVOT/AV VTI ratio: 0.22   MITRAL VALVE  MV Area (PHT): 2.93 cm     SHUNTS  MV Peak grad:  9.6 mmHg     Systemic VTI: 0.18 m  MV Mean grad:  4.0 mmHg  MV Vmax:       1.55 m/s  MV Vmean:      85.7 cm/s  MV Decel Time: 259 msec  MV E velocity: 141.33 cm/s   Nelva Bush MD  Electronically signed by Nelva Bush MD  Signature Date/Time: 07/21/2021/4:35:54 PM  Final       Physicians   Panel Physicians Referring Physician Case Authorizing Physician  Sherren Mocha, MD (Primary)        Procedures   RIGHT/LEFT HEART CATH AND CORONARY ANGIOGRAPHY    Conclusion       Prox RCA lesion is 40% stenosed.   Mid RCA lesion is 60% stenosed.   There is severe aortic valve stenosis.   1.  Nonobstructive coronary artery disease with moderate mid RCA stenosis, wide patency of the left main, patency of the LAD with mild diffuse plaquing, and patency of the large left circumflex with mild diffuse plaquing 2.  Severe calcific aortic stenosis with peak to peak transvalvular gradient 64 mmHg, mean gradient 55 mmHg, aortic valve area 0.93 cm, aortic valve area index of 0.41 3.  Essentially normal right heart pressures   Recommendations: Continue TAVR evaluation.   Procedural Details   Technical Details INDICATION: Severe symptomatic aortic stenosis  PROCEDURAL DETAILS: There was an indwelling IV in a right antecubital vein. Using normal sterile technique, the IV was changed out for a 5 Fr brachial sheath over a 0.018 inch wire. The right wrist was then prepped, draped, and anesthetized with 1% lidocaine. Using the modified Seldinger technique a 5/6 French Slender sheath was placed in the right radial artery. Intra-arterial verapamil was administered through the radial artery sheath. IV heparin was administered after a JR4 catheter was advanced into  the central aorta. A Swan-Ganz catheter was used for the right heart catheterization. Standard protocol was followed for recording of right heart pressures and sampling of oxygen saturations. Fick cardiac output was calculated. Standard Judkins catheters were used for selective coronary angiography.  The aortic valve was crossed with a J-wire and a JR4 catheter.  LV pressure is recorded and an aortic valve pullback is performed. There were no immediate procedural complications. The patient was transferred to the post catheterization recovery area for further monitoring.      Estimated blood loss <50 mL.   During this procedure medications were administered to achieve and maintain moderate conscious sedation while the patient's heart rate, blood pressure, and oxygen saturation were continuously monitored and I was present face-to-face 100% of this time.    Medications (Filter: Administrations occurring from 1127 to 1220 on 08/28/21) Heparin (Porcine) in NaCl 1000-0.9 UT/500ML-% SOLN (mL) Total volume:  1,500 mL Date/Time Rate/Dose/Volume Action    08/28/21 1130 500 mL Given    1130 500 mL Given    1130 500 mL Given      fentaNYL (SUBLIMAZE) injection (mcg) Total dose:  25 mcg Date/Time Rate/Dose/Volume Action    08/28/21 1157 25 mcg Given      midazolam (VERSED) injection (mg) Total dose:  1 mg Date/Time Rate/Dose/Volume Action    08/28/21 1157 1 mg Given      lidocaine (PF) (XYLOCAINE) 1 % injection (mL) Total volume:  4 mL Date/Time Rate/Dose/Volume Action    08/28/21 1158 2 mL Given    1158 2 mL Given      Radial Cocktail/Verapamil only (mL) Total volume:  10 mL Date/Time Rate/Dose/Volume Action    08/28/21 1159 10 mL Given      heparin sodium (porcine) injection (Units) Total dose:  5,000 Units Date/Time Rate/Dose/Volume Action    08/28/21 1206 5,000 Units Given      iohexol (OMNIPAQUE) 350 MG/ML injection (mL) Total volume:  55 mL Date/Time Rate/Dose/Volume Action     08/28/21 1219 55 mL Given      Sedation  Time   Sedation Time Physician-1: 15 minutes 54 seconds Contrast   Medication Name Total Dose  iohexol (OMNIPAQUE) 350 MG/ML injection 55 mL    Radiation/Fluoro   Fluoro time: 3.5 (min) DAP: 19815 (mGycm2) Cumulative Air Kerma: 382 (mGy) Coronary Findings   Diagnostic Dominance: Right Left Main  Left main is calcified but widely patent. It divides into the LAD and left circumflex. There is no significant stenosis present.  Left Anterior Descending  Vessel is large. There is mild diffuse disease throughout the vessel. The vessel is moderately calcified. The LAD reaches the apex. There is mild diffuse plaquing with no high-grade stenosis. The vessel is diffusely calcified.  Left Circumflex  There is mild diffuse disease throughout the vessel. The circumflex is large in caliber. It gives off a single obtuse marginal branch that divides into twin branches. There is no stenosis throughout the circumflex. There is mild diffuse plaquing noted.  Right Coronary Artery  Vessel is small. Relatively small caliber RCA with moderate diffuse mid vessel stenosis.  Prox RCA lesion is 40% stenosed.  Mid RCA lesion is 60% stenosed.  Intervention   No interventions have been documented. Left Heart   Aortic Valve There is severe aortic valve stenosis. The aortic valve is calcified. There is restricted aortic valve motion.    Coronary Diagrams   Diagnostic Dominance: Right Intervention   Implants      No implant documentation for this case.    Syngo Images    Show images for CARDIAC CATHETERIZATION Images on Long Term Storage    Show images for [place], [place] Link to Procedure Log   Procedure Log    Hemo Data   Flowsheet Row Most Recent Value  Fick Cardiac Output 6.4 L/min  Fick Cardiac Output Index 2.81 (L/min)/BSA  Aortic Mean Gradient 54.8 mmHg  Aortic Peak Gradient 64 mmHg  Aortic Valve Area 0.93  Aortic Value Area Index 0.41  cm2/BSA  RA A Wave 2 mmHg  RA V Wave 3 mmHg  RA Mean 2 mmHg  RV Systolic Pressure 38 mmHg  RV Diastolic Pressure -4 mmHg  RV EDP 0 mmHg  PA Systolic Pressure 39 mmHg  PA Diastolic Pressure 13 mmHg  PA Mean 24 mmHg  PW A Wave 21 mmHg  PW V Wave 26 mmHg  PW Mean 19 mmHg  AO Systolic Pressure 536 mmHg  AO Diastolic Pressure 59 mmHg  AO Mean 82 mmHg  LV Systolic Pressure 644 mmHg  LV Diastolic Pressure 2 mmHg  LV EDP 7 mmHg  AOp Systolic Pressure 034 mmHg  AOp Diastolic Pressure 64 mmHg  AOp Mean Pressure 90 mmHg  LVp Systolic Pressure 742 mmHg  LVp Diastolic Pressure 0 mmHg  LVp EDP Pressure 7 mmHg  QP/QS 1  TPVR Index 8.55 HRUI  TSVR Index 29.22 HRUI  PVR SVR Ratio 0.06  TPVR/TSVR Ratio 0.29      ADDENDUM REPORT: 09/10/2021 19:59   CLINICAL DATA:  Severe Aortic Stenosis.   EXAM: Cardiac TAVR CT   TECHNIQUE: A non-contrast, gated CT scan was obtained with axial slices of 3 mm through the heart for aortic valve calcium scoring. A 120 kV retrospective, gated, contrast cardiac scan was obtained. Gantry rotation speed was 250 msecs and collimation was 0.6 mm. Nitroglycerin was not given. The 3D data set was reconstructed in 5% intervals of the 0-95% of the R-R cycle. Systolic and diastolic phases were analyzed on a dedicated workstation using MPR, MIP, and VRT modes. The patient received 100 cc  of contrast.   FINDINGS: Image quality: Excellent.   Noise artifact is: Limited.   Valve Morphology: The aortic valve is tricuspid. The leaflets are severely calcified with severely restricted movement in systole. All leaflets contain dense bulky calcifications.   Aortic Valve Calcium score: 6022   Aortic annular dimension:   Phase assessed: 30%   Annular area: 536 mm2   Annular perimeter: 83.1 mm   Max diameter: 28.9 mm   Min diameter: 24.1 mm   Annular and subannular calcification: There is a single protruding calcification under the [place] that extends into the  LVOT.   Optimal coplanar projection: RAO 3 CRA 7   Coronary Artery Height above Annulus:   Left Main: 12.8 mm   Right Coronary: 21.4 mm   Sinus of Valsalva Measurements:   Non-coronary: 35 mm   Right-coronary: 34 mm   Left-coronary: 37 mm   Sinus of Valsalva Height:   Non-coronary: 24.7 mm   Right-coronary: 23.4 mm   Left-coronary: 20.7 mm   Sinotubular Junction: 31 mm   Ascending Thoracic Aorta: 35 mm   Coronary Arteries: Normal coronary origin. Right dominance. The study was performed without use of NTG and is insufficient for plaque evaluation. Please refer to recent cardiac catheterization for coronary assessment. 3-vessel coronary calcifications noted.   Cardiac Morphology:   Right Atrium: Right atrial size is dilated.   Right Ventricle: The right ventricular cavity is within normal limits.   Left Atrium: Left atrial size is dilated. There is likely mixing artifact in the left atrial appendage but cannot exclude thrombus.   Left Ventricle: The ventricular cavity size is within normal limits. There are no stigmata of prior infarction. There is no abnormal filling defect.   Pulmonary arteries: Normal in size without proximal filling defect.   Pulmonary veins: Normal pulmonary venous drainage.   Pericardium: Normal thickness with no significant effusion or calcium present.   Mitral Valve: The mitral valve is degenerative with moderate to severe annular calcium.   Extra-cardiac findings: See attached radiology report for non-cardiac structures.   IMPRESSION: 1. Tricuspid aortic valve with severe calcifications (calcium score = 6022).   2. Annular measurements appropriate for 26 mm S3 (536 mm2).   3. There is a single protruding calcification under the [place] that extends into the LVOT.   4. Sufficient coronary to annulus distance.   5. Optimal Fluoroscopic Angle for Delivery: RAO 3 CRA 7   6. There is likely mixing artifact in the left atrial  appendage but cannot exclude thrombus.   7. Moderate to severe mitral annular calcification.   [place] T. Audie Box, MD     Electronically Signed   By: Eleonore Chiquito M.D.   On: 09/10/2021 19:59    Addended by Geralynn Rile, MD on 09/10/2021  8:02 PM    Study Result   Narrative & Impression  EXAM: OVER-READ INTERPRETATION  CT CHEST   The following report is an over-read performed by radiologist Dr. Vinnie Langton of [facility], [place] on 09/10/2021. This over-read does not include interpretation of cardiac or coronary anatomy or pathology. The coronary calcium score/coronary CTA interpretation by the cardiologist is attached.   COMPARISON:  None.   FINDINGS: Extracardiac findings will be described separately under dictation for contemporaneously obtained CTA chest, abdomen and pelvis.   IMPRESSION: Please see separate dictation for contemporaneously obtained CTA chest, abdomen and pelvis dated 09/10/2021 for full description of relevant extracardiac findings.   Electronically Signed: By: Vinnie Langton M.D. On: 09/10/2021 10:48  Narrative & Impression  CLINICAL DATA:  87 year old male with history of severe aortic stenosis. Preprocedural study prior to potential transcatheter aortic valve replacement (TAVR) procedure.   EXAM: CT ANGIOGRAPHY CHEST, ABDOMEN AND PELVIS   TECHNIQUE: Multidetector CT imaging through the chest, abdomen and pelvis was performed using the standard protocol during bolus administration of intravenous contrast. Multiplanar reconstructed images and MIPs were obtained and reviewed to evaluate the vascular anatomy.   CONTRAST:  151mL OMNIPAQUE IOHEXOL 350 MG/ML SOLN   COMPARISON:  No priors.   FINDINGS: CTA CHEST FINDINGS   Cardiovascular: Heart size is mildly enlarged with biatrial dilatation. There is no significant pericardial fluid, thickening or pericardial calcification. There is aortic atherosclerosis, as  well as atherosclerosis of the great vessels of the mediastinum and the coronary arteries, including calcified atherosclerotic plaque in the left main, left anterior descending, left circumflex and right coronary arteries. Severe calcifications of the aortic valve.   Mediastinum/Lymph Nodes: No pathologically enlarged mediastinal or hilar lymph nodes. Please note that accurate exclusion of hilar adenopathy is limited on noncontrast CT scans. Esophagus is unremarkable in appearance. No axillary lymphadenopathy.   Lungs/Pleura: Moderate right and trace left pleural effusions lying dependently. Some passive subsegmental atelectasis is noted in the lower lobes of the lungs bilaterally. No acute consolidative airspace disease. No definite suspicious appearing pulmonary nodules or masses are noted.   Musculoskeletal/Soft Tissues: There are no aggressive appearing lytic or blastic lesions noted in the visualized portions of the skeleton.   CTA ABDOMEN AND PELVIS FINDINGS   Hepatobiliary: No suspicious cystic or solid hepatic lesions. No intra or extrahepatic biliary ductal dilatation. Gallbladder is normal in appearance.   Pancreas: No pancreatic mass. No pancreatic ductal dilatation. No pancreatic or peripancreatic fluid collections or inflammatory changes.   Spleen: Unremarkable.   Adrenals/Urinary Tract: Bilateral kidneys and bilateral adrenal glands are normal in appearance. No hydroureteronephrosis. Urinary bladder is partially obscured by beam hardening artifact from the patient's left hip arthroplasty. Visualized portions of the urinary bladder are normal in appearance.   Stomach/Bowel: The appearance of the stomach is normal. No pathologic dilatation of small bowel or colon. Several scattered colonic diverticulae are noted, without surrounding inflammatory changes to suggest an acute diverticulitis at this time. The appendix is not confidently identified and may be  surgically absent. Regardless, there are no inflammatory changes noted adjacent to the cecum to suggest the presence of an acute appendicitis at this time.   Vascular/Lymphatic: Aortic atherosclerosis, without evidence of aneurysm or dissection in the abdominal or pelvic vasculature. No lymphadenopathy noted in the abdomen or pelvis.   Reproductive: Prostate gland and seminal vesicles are unremarkable in appearance.   Other: No significant volume of ascites.  No pneumoperitoneum.   Musculoskeletal: Status post left hip arthroplasty. There are no aggressive appearing lytic or blastic lesions noted in the visualized portions of the skeleton.   VASCULAR MEASUREMENTS PERTINENT TO TAVR:   AORTA:   Minimal Aortic Diameter-18 x 16 mm   Severity of Aortic Calcification-severe   RIGHT PELVIS:   Right Common Iliac Artery -   Minimal Diameter-10.6 x 13.1 mm   Tortuosity-severe   Calcification-moderate   Right External Iliac Artery -   Minimal Diameter-9.8 x 9.6 mm   Tortuosity-severe   Calcification-mild   Right Common Femoral Artery -   Minimal Diameter-9.8 x 6.3 mm   Tortuosity-mild   Calcification-moderate   LEFT PELVIS:   Left Common Iliac Artery -   Minimal Diameter-11.9 x 9.3 mm   Tortuosity-very  severe   Calcification-moderate   Left External Iliac Artery -   Minimal Diameter-10.6 x 7.3 mm   Tortuosity-moderate   Calcification-mild   Left Common Femoral Artery -   Minimal Diameter-11.3 x 4.7 mm   Tortuosity-mild   Calcification-moderate to severe   Review of the MIP images confirms the above findings.   IMPRESSION: 1. Vascular findings and measurements pertinent to potential TAVR procedure, as detailed above. 2. Severe thickening calcification of the aortic valve, compatible with reported clinical history of severe aortic stenosis. 3. Aortic atherosclerosis, in addition to left main and 3 vessel coronary artery disease. 4. Cardiomegaly  with biatrial dilatation 5. Moderate right and trace left pleural effusions lying dependently with some passive subsegmental atelectasis in the lower lobes of the lungs bilaterally (right greater than left). 6. Colonic diverticulosis without evidence of acute diverticulitis at this time. 7. Additional incidental findings, as above.     Electronically Signed   By: Vinnie Langton M.D.   On: 09/10/2021 12:12        STS RISK CALCULATOR: Isolated AVR: Risk of Mortality: 2.359% Renal Failure: 3.653% Permanent Stroke: 1.978% Prolonged Ventilation: 6.377% DSW Infection: 0.161% Reoperation: 3.709% Morbidity or Mortality: 13.104% Short Length of Stay: 22.377% Long Length of Stay: 8.178%     Impression:   This 87 year old gentleman has stage D, severe, symptomatic aortic stenosis with New York Heart Association class II symptoms of exertional fatigue and shortness of breath consistent with chronic diastolic congestive heart failure.  He had recent hospitalization with acute diastolic congestive heart failure.  I have personally reviewed his 2D echocardiogram, cardiac catheterization, and CTA studies.  His echo images are suboptimal but the aortic valve appears calcified with a mean gradient of 38 mmHg and a peak gradient of 58 mmHg.  Dimensionless index is 0.22 consistent with severe aortic stenosis.  Left ventricular ejection fraction was greater than 55%.  Cardiac catheterization shows moderate nonobstructive RCA stenosis with otherwise no significant disease.  The mean gradient was measured at 55 mmHg with a peak gradient of 64 mmHg and a valve area of 0.93 cm consistent with severe aortic stenosis.  Right heart pressures were normal.  I agree that aortic valve replacement is indicated in this patient for relief of his symptoms and to prevent progressive left ventricular deterioration.  Given his advanced age and limited mobility due to degenerative knee arthritis I think  transcatheter aortic valve replacement would be the only option for treating him.  His gated cardiac CTA shows anatomy suitable for TAVR using a 29 mm sapient 3 valve.  His abdominal and pelvic CTA shows adequate pelvic vascular anatomy to allow transfemoral insertion.   The patient and his daughter were counseled at length regarding treatment alternatives for management of severe symptomatic aortic stenosis. The risks and benefits of surgical intervention has been discussed in detail. Long-term prognosis with medical therapy was discussed. Alternative approaches such as conventional surgical aortic valve replacement, transcatheter aortic valve replacement, and palliative medical therapy were compared and contrasted at length. This discussion was placed in the context of the patient's own specific clinical presentation and past medical history. All of their questions have been addressed.    Following the decision to proceed with transcatheter aortic valve replacement, a discussion was held regarding what types of management strategies would be attempted intraoperatively in the event of life-threatening complications, including whether or not the patient would be considered a candidate for the use of cardiopulmonary bypass and/or conversion to open sternotomy for attempted  surgical intervention.  I do not think he is a candidate for emergent sternotomy to manage any intraoperative complications due to his advanced age and limited mobility.  The patient is aware of the fact that transient use of cardiopulmonary bypass may be necessary. The patient has been advised of a variety of complications that might develop including but not limited to risks of death, stroke, paravalvular leak, aortic dissection or other major vascular complications, aortic annulus rupture, device embolization, cardiac rupture or perforation, mitral regurgitation, acute myocardial infarction, arrhythmia, heart block or bradycardia requiring  permanent pacemaker placement, congestive heart failure, respiratory failure, renal failure, pneumonia, infection, other late complications related to structural valve deterioration or migration, or other complications that might ultimately cause a temporary or permanent loss of functional independence or other long term morbidity. The patient provides full informed consent for the procedure as described and all questions were answered.       Plan:     Transfemoral transcatheter aortic valve placement using a SAPIEN 3 valve.     Gaye Pollack, MD

## 2021-09-29 ENCOUNTER — Inpatient Hospital Stay (HOSPITAL_COMMUNITY)
Admission: RE | Admit: 2021-09-29 | Discharge: 2021-09-30 | DRG: 267 | Disposition: A | Discharge status: Home / Self Care | Payer: Medicare Other | Attending: Cardiovascular Disease | Admitting: Cardiovascular Disease

## 2021-09-29 ENCOUNTER — Encounter (HOSPITAL_COMMUNITY): Payer: Self-pay | Admitting: Cardiovascular Disease

## 2021-09-29 ENCOUNTER — Encounter (HOSPITAL_COMMUNITY)
Admission: RE | Disposition: A | Discharge status: Home / Self Care | Payer: Self-pay | Source: Home / Self Care | Attending: Cardiovascular Disease

## 2021-09-29 ENCOUNTER — Inpatient Hospital Stay (HOSPITAL_COMMUNITY)
Admission: RE | Admit: 2021-09-29 | Discharge: 2021-09-29 | Disposition: A | Discharge status: Home / Self Care | Payer: Medicare Other | Source: Home / Self Care | Attending: Physician Assistant | Admitting: Physician Assistant

## 2021-09-29 ENCOUNTER — Other Ambulatory Visit: Payer: Self-pay | Admitting: Cardiology

## 2021-09-29 ENCOUNTER — Inpatient Hospital Stay (HOSPITAL_COMMUNITY): Payer: Medicare Other | Admitting: Physician Assistant

## 2021-09-29 ENCOUNTER — Inpatient Hospital Stay (HOSPITAL_COMMUNITY): Payer: Medicare Other | Admitting: Certified Registered Nurse Anesthetist

## 2021-09-29 ENCOUNTER — Other Ambulatory Visit: Payer: Self-pay

## 2021-09-29 ENCOUNTER — Encounter: Payer: Self-pay | Admitting: Physician Assistant

## 2021-09-29 DIAGNOSIS — I5032 Chronic diastolic (congestive) heart failure: Secondary | ICD-10-CM | POA: Diagnosis present

## 2021-09-29 DIAGNOSIS — Z888 Allergy status to other drugs, medicaments and biological substances status: Secondary | ICD-10-CM | POA: Diagnosis not present

## 2021-09-29 DIAGNOSIS — Z803 Family history of malignant neoplasm of breast: Secondary | ICD-10-CM

## 2021-09-29 DIAGNOSIS — Z96642 Presence of left artificial hip joint: Secondary | ICD-10-CM | POA: Diagnosis present

## 2021-09-29 DIAGNOSIS — Z7901 Long term (current) use of anticoagulants: Secondary | ICD-10-CM

## 2021-09-29 DIAGNOSIS — Z952 Presence of prosthetic heart valve: Secondary | ICD-10-CM

## 2021-09-29 DIAGNOSIS — Z006 Encounter for examination for normal comparison and control in clinical research program: Secondary | ICD-10-CM

## 2021-09-29 DIAGNOSIS — K5730 Diverticulosis of large intestine without perforation or abscess without bleeding: Secondary | ICD-10-CM | POA: Diagnosis present

## 2021-09-29 DIAGNOSIS — E119 Type 2 diabetes mellitus without complications: Secondary | ICD-10-CM | POA: Diagnosis present

## 2021-09-29 DIAGNOSIS — I10 Essential (primary) hypertension: Secondary | ICD-10-CM | POA: Diagnosis present

## 2021-09-29 DIAGNOSIS — I4821 Permanent atrial fibrillation: Secondary | ICD-10-CM | POA: Diagnosis present

## 2021-09-29 DIAGNOSIS — M109 Gout, unspecified: Secondary | ICD-10-CM | POA: Diagnosis present

## 2021-09-29 DIAGNOSIS — I350 Nonrheumatic aortic (valve) stenosis: Principal | ICD-10-CM | POA: Diagnosis present

## 2021-09-29 DIAGNOSIS — I110 Hypertensive heart disease with heart failure: Secondary | ICD-10-CM | POA: Diagnosis present

## 2021-09-29 DIAGNOSIS — I2510 Atherosclerotic heart disease of native coronary artery without angina pectoris: Secondary | ICD-10-CM | POA: Diagnosis present

## 2021-09-29 DIAGNOSIS — Z800 Family history of malignant neoplasm of digestive organs: Secondary | ICD-10-CM

## 2021-09-29 DIAGNOSIS — Z96653 Presence of artificial knee joint, bilateral: Secondary | ICD-10-CM | POA: Diagnosis present

## 2021-09-29 DIAGNOSIS — E785 Hyperlipidemia, unspecified: Secondary | ICD-10-CM | POA: Diagnosis present

## 2021-09-29 DIAGNOSIS — R011 Cardiac murmur, unspecified: Secondary | ICD-10-CM | POA: Diagnosis present

## 2021-09-29 DIAGNOSIS — I482 Chronic atrial fibrillation, unspecified: Secondary | ICD-10-CM | POA: Diagnosis present

## 2021-09-29 HISTORY — DX: Chronic diastolic (congestive) heart failure: I50.32

## 2021-09-29 HISTORY — PX: TRANSCATHETER AORTIC VALVE REPLACEMENT, TRANSFEMORAL: SHX6400

## 2021-09-29 HISTORY — DX: Presence of prosthetic heart valve: Z95.2

## 2021-09-29 HISTORY — PX: TEE WITHOUT CARDIOVERSION: SHX5443

## 2021-09-29 LAB — POCT I-STAT 7, (LYTES, BLD GAS, ICA,H+H)
Acid-Base Excess: 6 mmol/L — ABNORMAL HIGH (ref 0.0–2.0)
Acid-Base Excess: 7 mmol/L — ABNORMAL HIGH (ref 0.0–2.0)
Bicarbonate: 30.3 mmol/L — ABNORMAL HIGH (ref 20.0–28.0)
Bicarbonate: 30.3 mmol/L — ABNORMAL HIGH (ref 20.0–28.0)
Calcium, Ion: 1.13 mmol/L — ABNORMAL LOW (ref 1.15–1.40)
Calcium, Ion: 1.11 mmol/L — ABNORMAL LOW (ref 1.15–1.40)
HCT: 36 % — ABNORMAL LOW (ref 39.0–52.0)
HCT: 36 % — ABNORMAL LOW (ref 39.0–52.0)
Hemoglobin: 12.2 g/dL — ABNORMAL LOW (ref 13.0–17.0)
Hemoglobin: 12.2 g/dL — ABNORMAL LOW (ref 13.0–17.0)
O2 Saturation: 100 %
O2 Saturation: 100 %
Potassium: 3.3 mmol/L — ABNORMAL LOW (ref 3.5–5.1)
Potassium: 3.4 mmol/L — ABNORMAL LOW (ref 3.5–5.1)
Sodium: 135 mmol/L (ref 135–145)
Sodium: 134 mmol/L — ABNORMAL LOW (ref 135–145)
TCO2: 32 mmol/L (ref 22–32)
TCO2: 31 mmol/L (ref 22–32)
pCO2 arterial: 42.6 mmHg (ref 32.0–48.0)
pCO2 arterial: 39.5 mmHg (ref 32.0–48.0)
pH, Arterial: 7.46 — ABNORMAL HIGH (ref 7.350–7.450)
pH, Arterial: 7.493 — ABNORMAL HIGH (ref 7.350–7.450)
pO2, Arterial: 163 mmHg — ABNORMAL HIGH (ref 83.0–108.0)
pO2, Arterial: 164 mmHg — ABNORMAL HIGH (ref 83.0–108.0)

## 2021-09-29 LAB — POCT I-STAT, CHEM 8
BUN: 16 mg/dL (ref 8–23)
BUN: 16 mg/dL (ref 8–23)
Calcium, Ion: 1.07 mmol/L — ABNORMAL LOW (ref 1.15–1.40)
Calcium, Ion: 1.14 mmol/L — ABNORMAL LOW (ref 1.15–1.40)
Chloride: 92 mmol/L — ABNORMAL LOW (ref 98–111)
Chloride: 93 mmol/L — ABNORMAL LOW (ref 98–111)
Creatinine, Ser: 0.9 mg/dL (ref 0.61–1.24)
Creatinine, Ser: 0.9 mg/dL (ref 0.61–1.24)
Glucose, Bld: 148 mg/dL — ABNORMAL HIGH (ref 70–99)
Glucose, Bld: 148 mg/dL — ABNORMAL HIGH (ref 70–99)
HCT: 37 % — ABNORMAL LOW (ref 39.0–52.0)
HCT: 36 % — ABNORMAL LOW (ref 39.0–52.0)
Hemoglobin: 12.6 g/dL — ABNORMAL LOW (ref 13.0–17.0)
Hemoglobin: 12.2 g/dL — ABNORMAL LOW (ref 13.0–17.0)
Potassium: 3.4 mmol/L — ABNORMAL LOW (ref 3.5–5.1)
Potassium: 3.3 mmol/L — ABNORMAL LOW (ref 3.5–5.1)
Sodium: 135 mmol/L (ref 135–145)
Sodium: 135 mmol/L (ref 135–145)
TCO2: 29 mmol/L (ref 22–32)
TCO2: 30 mmol/L (ref 22–32)

## 2021-09-29 LAB — ABO/RH: ABO/RH(D): O POS

## 2021-09-29 LAB — GLUCOSE, CAPILLARY
Glucose-Capillary: 117 mg/dL — ABNORMAL HIGH (ref 70–99)
Glucose-Capillary: 143 mg/dL — ABNORMAL HIGH (ref 70–99)
Glucose-Capillary: 159 mg/dL — ABNORMAL HIGH (ref 70–99)
Glucose-Capillary: 226 mg/dL — ABNORMAL HIGH (ref 70–99)
Glucose-Capillary: 261 mg/dL — ABNORMAL HIGH (ref 70–99)

## 2021-09-29 LAB — POCT ACTIVATED CLOTTING TIME: Activated Clotting Time: 144 seconds

## 2021-09-29 SURGERY — IMPLANTATION, AORTIC VALVE, TRANSCATHETER, FEMORAL APPROACH
Anesthesia: General | Site: Chest

## 2021-09-29 MED ORDER — PROPOFOL 10 MG/ML IV BOLUS
INTRAVENOUS | Status: DC | PRN
  Administered 2021-09-29 (×2): 20 mg via INTRAVENOUS

## 2021-09-29 MED ORDER — IODIXANOL 320 MG/ML IV SOLN
INTRAVENOUS | Status: DC | PRN
  Administered 2021-09-29: 60 mL via INTRA_ARTERIAL

## 2021-09-29 MED ORDER — ACETAMINOPHEN 650 MG RE SUPP: 650.0000 mg | Freq: Four times a day (QID) | RECTAL | Status: DC | PRN

## 2021-09-29 MED ORDER — FENTANYL CITRATE (PF) 250 MCG/5ML IJ SOLN
INTRAMUSCULAR | Status: AC
  Filled 2021-09-29: qty 5

## 2021-09-29 MED ORDER — ONDANSETRON HCL 4 MG/2ML IJ SOLN
INTRAMUSCULAR | Status: DC | PRN
  Administered 2021-09-29: 4 mg via INTRAVENOUS

## 2021-09-29 MED ORDER — HEPARIN 6000 UNIT IRRIGATION SOLUTION
Status: AC
  Filled 2021-09-29: qty 500

## 2021-09-29 MED ORDER — SIMVASTATIN 20 MG PO TABS
20.0000 mg | ORAL_TABLET | Freq: Every day | ORAL | Status: DC
  Administered 2021-09-29: 20 mg via ORAL
  Filled 2021-09-29: qty 1

## 2021-09-29 MED ORDER — SODIUM CHLORIDE 0.9 % IV SOLN: INTRAVENOUS | Status: AC

## 2021-09-29 MED ORDER — HEPARIN SODIUM (PORCINE) 1000 UNIT/ML IJ SOLN
INTRAMUSCULAR | Status: AC
  Filled 2021-09-29: qty 2

## 2021-09-29 MED ORDER — LACTATED RINGERS IV SOLN: INTRAVENOUS | Status: DC

## 2021-09-29 MED ORDER — CHLORHEXIDINE GLUCONATE 0.12 % MT SOLN
15.0000 mL | Freq: Once | OROMUCOSAL | Status: AC
  Administered 2021-09-29: 15 mL via OROMUCOSAL
  Filled 2021-09-29: qty 15

## 2021-09-29 MED ORDER — ONDANSETRON HCL 4 MG/2ML IJ SOLN: 4.0000 mg | Freq: Four times a day (QID) | INTRAMUSCULAR | Status: DC | PRN

## 2021-09-29 MED ORDER — HEPARIN SODIUM (PORCINE) 1000 UNIT/ML IJ SOLN
INTRAMUSCULAR | Status: DC | PRN
  Administered 2021-09-29: 15000 [IU] via INTRAVENOUS

## 2021-09-29 MED ORDER — ONDANSETRON HCL 4 MG/2ML IJ SOLN
INTRAMUSCULAR | Status: AC
  Filled 2021-09-29: qty 2

## 2021-09-29 MED ORDER — DEXAMETHASONE SODIUM PHOSPHATE 10 MG/ML IJ SOLN
INTRAMUSCULAR | Status: AC
  Filled 2021-09-29: qty 1

## 2021-09-29 MED ORDER — LIDOCAINE HCL 1 % IJ SOLN
INTRAMUSCULAR | Status: AC
  Filled 2021-09-29: qty 20

## 2021-09-29 MED ORDER — PHENYLEPHRINE 40 MCG/ML (10ML) SYRINGE FOR IV PUSH (FOR BLOOD PRESSURE SUPPORT)
PREFILLED_SYRINGE | INTRAVENOUS | Status: AC
  Filled 2021-09-29: qty 10

## 2021-09-29 MED ORDER — ALLOPURINOL 300 MG PO TABS
300.0000 mg | ORAL_TABLET | Freq: Every day | ORAL | Status: DC
  Administered 2021-09-29 – 2021-09-30 (×2): 300 mg via ORAL
  Filled 2021-09-29 (×2): qty 1

## 2021-09-29 MED ORDER — PHENYLEPHRINE 40 MCG/ML (10ML) SYRINGE FOR IV PUSH (FOR BLOOD PRESSURE SUPPORT)
PREFILLED_SYRINGE | INTRAVENOUS | Status: DC | PRN
  Administered 2021-09-29: 80 ug via INTRAVENOUS
  Administered 2021-09-29 (×2): 120 ug via INTRAVENOUS
  Administered 2021-09-29: 160 ug via INTRAVENOUS
  Administered 2021-09-29: 40 ug via INTRAVENOUS
  Administered 2021-09-29 (×2): 80 ug via INTRAVENOUS

## 2021-09-29 MED ORDER — HEPARIN 6000 UNIT IRRIGATION SOLUTION
Status: AC
  Filled 2021-09-29: qty 1500

## 2021-09-29 MED ORDER — ACETAMINOPHEN 325 MG PO TABS: 650.0000 mg | ORAL_TABLET | Freq: Four times a day (QID) | ORAL | Status: DC | PRN

## 2021-09-29 MED ORDER — RIVAROXABAN 20 MG PO TABS
20.0000 mg | ORAL_TABLET | Freq: Every day | ORAL | Status: DC
  Administered 2021-09-29 – 2021-09-30 (×2): 20 mg via ORAL
  Filled 2021-09-29 (×2): qty 1

## 2021-09-29 MED ORDER — INSULIN ASPART 100 UNIT/ML IJ SOLN
0.0000 [IU] | INTRAMUSCULAR | Status: DC
  Administered 2021-09-29: 12 [IU] via SUBCUTANEOUS
  Administered 2021-09-29: 2 [IU] via SUBCUTANEOUS
  Administered 2021-09-29: 8 [IU] via SUBCUTANEOUS
  Administered 2021-09-30: 2 [IU] via SUBCUTANEOUS

## 2021-09-29 MED ORDER — OXYCODONE HCL 5 MG PO TABS
5.0000 mg | ORAL_TABLET | ORAL | Status: DC | PRN
Start: 2021-09-29 — End: 2021-09-30

## 2021-09-29 MED ORDER — LACTATED RINGERS IV SOLN: INTRAVENOUS | Status: DC | PRN

## 2021-09-29 MED ORDER — SODIUM CHLORIDE 0.9 % IV SOLN: INTRAVENOUS | Status: DC

## 2021-09-29 MED ORDER — ORAL CARE MOUTH RINSE: 15.0000 mL | Freq: Once | OROMUCOSAL | Status: AC

## 2021-09-29 MED ORDER — SODIUM CHLORIDE 0.9 % IV SOLN: 250.0000 mL | INTRAVENOUS | Status: DC | PRN

## 2021-09-29 MED ORDER — CHLORHEXIDINE GLUCONATE 4 % EX LIQD: 30.0000 mL | CUTANEOUS | Status: DC

## 2021-09-29 MED ORDER — PROTAMINE SULFATE 10 MG/ML IV SOLN
INTRAVENOUS | Status: DC | PRN
  Administered 2021-09-29: 30 mg via INTRAVENOUS
  Administered 2021-09-29: 120 mg via INTRAVENOUS

## 2021-09-29 MED ORDER — CEFAZOLIN SODIUM-DEXTROSE 2-4 GM/100ML-% IV SOLN
2.0000 g | Freq: Three times a day (TID) | INTRAVENOUS | Status: AC
  Administered 2021-09-29 (×2): 2 g via INTRAVENOUS
  Filled 2021-09-29 (×2): qty 100

## 2021-09-29 MED ORDER — HEPARIN 6000 UNIT IRRIGATION SOLUTION
Status: DC | PRN
  Administered 2021-09-29: 1

## 2021-09-29 MED ORDER — SUGAMMADEX SODIUM 200 MG/2ML IV SOLN
INTRAVENOUS | Status: DC | PRN
  Administered 2021-09-29: 200 mg via INTRAVENOUS

## 2021-09-29 MED ORDER — TRAMADOL HCL 50 MG PO TABS: 50.0000 mg | ORAL_TABLET | ORAL | Status: DC | PRN

## 2021-09-29 MED ORDER — SODIUM CHLORIDE 0.9 % IV SOLN: 250.0000 mL | INTRAVENOUS | Status: DC

## 2021-09-29 MED ORDER — SODIUM CHLORIDE 0.9% FLUSH
3.0000 mL | Freq: Two times a day (BID) | INTRAVENOUS | Status: DC
  Administered 2021-09-30: 3 mL via INTRAVENOUS

## 2021-09-29 MED ORDER — ROCURONIUM BROMIDE 10 MG/ML (PF) SYRINGE
PREFILLED_SYRINGE | INTRAVENOUS | Status: DC | PRN
  Administered 2021-09-29: 70 mg via INTRAVENOUS
  Administered 2021-09-29: 10 mg via INTRAVENOUS

## 2021-09-29 MED ORDER — SODIUM CHLORIDE 0.9% FLUSH: 3.0000 mL | INTRAVENOUS | Status: DC | PRN

## 2021-09-29 MED ORDER — MORPHINE SULFATE (PF) 2 MG/ML IV SOLN: 1.0000 mg | INTRAVENOUS | Status: DC | PRN

## 2021-09-29 MED ORDER — NITROGLYCERIN IN D5W 200-5 MCG/ML-% IV SOLN: 0.0000 ug/min | INTRAVENOUS | Status: DC

## 2021-09-29 MED ORDER — PROPOFOL 10 MG/ML IV BOLUS
INTRAVENOUS | Status: AC
  Filled 2021-09-29: qty 20

## 2021-09-29 MED ORDER — PHENYLEPHRINE HCL-NACL 20-0.9 MG/250ML-% IV SOLN: 0.0000 ug/min | INTRAVENOUS | Status: DC

## 2021-09-29 MED ORDER — PROTAMINE SULFATE 10 MG/ML IV SOLN
INTRAVENOUS | Status: AC
  Filled 2021-09-29: qty 25

## 2021-09-29 MED ORDER — ROCURONIUM BROMIDE 10 MG/ML (PF) SYRINGE
PREFILLED_SYRINGE | INTRAVENOUS | Status: AC
  Filled 2021-09-29: qty 10

## 2021-09-29 MED ORDER — DEXAMETHASONE SODIUM PHOSPHATE 10 MG/ML IJ SOLN
INTRAMUSCULAR | Status: DC | PRN
  Administered 2021-09-29: 4 mg via INTRAVENOUS

## 2021-09-29 SURGICAL SUPPLY — 85 items
BAG COUNTER SPONGE SURGICOUNT (BAG) ×3 IMPLANT
BAG DECANTER FOR FLEXI CONT (MISCELLANEOUS) IMPLANT
BAG SNAP BAND KOVER 36X36 (MISCELLANEOUS) ×3 IMPLANT
BLADE CLIPPER SURG (BLADE) IMPLANT
BLADE STERNUM SYSTEM 6 (BLADE) IMPLANT
BLADE SURG 10 STRL SS (BLADE) IMPLANT
CABLE ADAPT CONN TEMP 6FT (ADAPTER) ×3 IMPLANT
CANISTER SUCT 3000ML PPV (MISCELLANEOUS) IMPLANT
CATH DIAG EXPO 6F AL1 (CATHETERS) IMPLANT
CATH DIAG EXPO 6F VENT PIG 145 (CATHETERS) ×6 IMPLANT
CATH EXTERNAL FEMALE PUREWICK (CATHETERS) IMPLANT
CATH INFINITI 6F AL2 (CATHETERS) IMPLANT
CATH S G BIP PACING (CATHETERS) ×3 IMPLANT
CHLORAPREP W/TINT 26 (MISCELLANEOUS) ×3 IMPLANT
CLIP VESOCCLUDE MED 24/CT (CLIP) IMPLANT
CLIP VESOCCLUDE SM WIDE 24/CT (CLIP) IMPLANT
CLOSURE MYNX CONTROL 6F/7F (Vascular Products) ×3 IMPLANT
CNTNR URN SCR LID CUP LEK RST (MISCELLANEOUS) ×4 IMPLANT
CONT SPEC 4OZ STRL OR WHT (MISCELLANEOUS) ×2
COVER BACK TABLE 80X110 HD (DRAPES) IMPLANT
DECANTER SPIKE VIAL GLASS SM (MISCELLANEOUS) ×3 IMPLANT
DERMABOND ADHESIVE PROPEN (GAUZE/BANDAGES/DRESSINGS) ×1
DERMABOND ADVANCED (GAUZE/BANDAGES/DRESSINGS) ×1
DERMABOND ADVANCED .7 DNX12 (GAUZE/BANDAGES/DRESSINGS) ×2 IMPLANT
DERMABOND ADVANCED .7 DNX6 (GAUZE/BANDAGES/DRESSINGS) ×2 IMPLANT
DEVICE CLOSURE PERCLS PRGLD 6F (VASCULAR PRODUCTS) ×4 IMPLANT
DRAPE INCISE IOBAN 66X45 STRL (DRAPES) IMPLANT
DRSG TEGADERM 4X4.75 (GAUZE/BANDAGES/DRESSINGS) ×3 IMPLANT
ELECT CAUTERY BLADE 6.4 (BLADE) IMPLANT
ELECT REM PT RETURN 9FT ADLT (ELECTROSURGICAL) ×6
ELECTRODE REM PT RTRN 9FT ADLT (ELECTROSURGICAL) ×4 IMPLANT
FELT TEFLON 6X6 (MISCELLANEOUS) ×3 IMPLANT
GAUZE SPONGE 4X4 12PLY STRL (GAUZE/BANDAGES/DRESSINGS) ×3 IMPLANT
GLOVE EUDERMIC 7 POWDERFREE (GLOVE) IMPLANT
GLOVE SURG ENC MOIS LTX SZ7.5 (GLOVE) IMPLANT
GLOVE SURG ENC MOIS LTX SZ8 (GLOVE) IMPLANT
GLOVE SURG ORTHO LTX SZ7.5 (GLOVE) IMPLANT
GOWN STRL REUS W/ TWL LRG LVL3 (GOWN DISPOSABLE) IMPLANT
GOWN STRL REUS W/ TWL XL LVL3 (GOWN DISPOSABLE) ×2 IMPLANT
GOWN STRL REUS W/TWL LRG LVL3 (GOWN DISPOSABLE)
GOWN STRL REUS W/TWL XL LVL3 (GOWN DISPOSABLE) ×1
GUIDEWIRE SAF TJ AMPL .035X180 (WIRE) ×3 IMPLANT
GUIDEWIRE SAFE TJ AMPLATZ EXST (WIRE) ×3 IMPLANT
INSERT FOGARTY SM (MISCELLANEOUS) IMPLANT
KIT BASIN OR (CUSTOM PROCEDURE TRAY) ×3 IMPLANT
KIT HEART LEFT (KITS) ×3 IMPLANT
KIT SUCTION CATH 14FR (SUCTIONS) IMPLANT
KIT TURNOVER KIT B (KITS) ×3 IMPLANT
LOOP VESSEL MAXI BLUE (MISCELLANEOUS) IMPLANT
LOOP VESSEL MINI RED (MISCELLANEOUS) IMPLANT
NS IRRIG 1000ML POUR BTL (IV SOLUTION) ×3 IMPLANT
PACK ENDO MINOR (CUSTOM PROCEDURE TRAY) ×3 IMPLANT
PAD ARMBOARD 7.5X6 YLW CONV (MISCELLANEOUS) ×6 IMPLANT
PAD ELECT DEFIB RADIOL ZOLL (MISCELLANEOUS) ×3 IMPLANT
PENCIL BUTTON HOLSTER BLD 10FT (ELECTRODE) IMPLANT
PERCLOSE PROGLIDE 6F (VASCULAR PRODUCTS) ×6
POSITIONER HEAD DONUT 9IN (MISCELLANEOUS) ×3 IMPLANT
SET MICROPUNCTURE 5F STIFF (MISCELLANEOUS) ×3 IMPLANT
SHEATH BRITE TIP 7FR 35CM (SHEATH) ×3 IMPLANT
SHEATH PINNACLE 6F 10CM (SHEATH) ×3 IMPLANT
SHEATH PINNACLE 8F 10CM (SHEATH) ×3 IMPLANT
SLEEVE REPOSITIONING LENGTH 30 (MISCELLANEOUS) ×3 IMPLANT
STOPCOCK MORSE 400PSI 3WAY (MISCELLANEOUS) ×6 IMPLANT
SUT ETHIBOND X763 2 0 SH 1 (SUTURE) IMPLANT
SUT GORETEX CV 4 TH 22 36 (SUTURE) IMPLANT
SUT GORETEX CV4 TH-18 (SUTURE) IMPLANT
SUT MNCRL AB 3-0 PS2 18 (SUTURE) IMPLANT
SUT PROLENE 5 0 C 1 36 (SUTURE) IMPLANT
SUT PROLENE 6 0 C 1 30 (SUTURE) IMPLANT
SUT SILK  1 MH (SUTURE) ×1
SUT SILK 1 MH (SUTURE) ×2 IMPLANT
SUT VIC AB 2-0 CT1 27 (SUTURE)
SUT VIC AB 2-0 CT1 TAPERPNT 27 (SUTURE) IMPLANT
SUT VIC AB 2-0 CTX 36 (SUTURE) IMPLANT
SUT VIC AB 3-0 SH 8-18 (SUTURE) IMPLANT
SYR 50ML LL SCALE MARK (SYRINGE) ×3 IMPLANT
SYR BULB IRRIG 60ML STRL (SYRINGE) IMPLANT
SYR MEDRAD MARK V 150ML (SYRINGE) ×3 IMPLANT
TOWEL GREEN STERILE (TOWEL DISPOSABLE) ×6 IMPLANT
TRANSDUCER W/STOPCOCK (MISCELLANEOUS) ×6 IMPLANT
TRAY FOLEY SLVR 14FR TEMP STAT (SET/KITS/TRAYS/PACK) IMPLANT
TUBE SUCT INTRACARD DLP 20F (MISCELLANEOUS) IMPLANT
VALVE HEART TRANSCATH SZ3 29MM (Valve) ×3 IMPLANT
WIRE EMERALD 3MM-J .035X150CM (WIRE) ×3 IMPLANT
WIRE EMERALD 3MM-J .035X260CM (WIRE) ×3 IMPLANT

## 2021-09-29 NOTE — Interval H&P Note (Signed)
History and Physical Interval Note:  09/29/2021 6:27 AM  William Sawyer  has presented today for surgery, with the diagnosis of AO.  The various methods of treatment have been discussed with the patient and family. After consideration of risks, benefits and other options for treatment, the patient has consented to  Procedure(s): TRANSCATHETER AORTIC VALVE REPLACEMENT, TRANSFEMORAL (N/A) TRANSESOPHAGEAL ECHOCARDIOGRAM (TEE) (N/A) as a surgical intervention.  The patient's history has been reviewed, patient examined, no change in status, stable for surgery.  I have reviewed the patient's chart and labs.  Questions were answered to the patient's satisfaction.     Gaye Pollack

## 2021-09-29 NOTE — Progress Notes (Signed)
  [facility] VALVE TEAM  Patient doing well s/p TAVR. He is hemodynamically stable. Groin sites stable. Pt had possible atrial appendage thrombosis on intra-operative TEE therefore he will be restarted on Xarelto 20mg  this evening. ECG with chronic atrial fibrillation with no high grade block. Arterial line discontinued and patient transferred to 4E. Plan for early ambulation after bedrest completed and hopeful discharge over the next 24-48 hours.   Kathyrn Drown NP-C Structural Heart Team  Pager: [phone]

## 2021-09-29 NOTE — Anesthesia Preprocedure Evaluation (Signed)
Anesthesia Evaluation  Patient identified by MRN, date of birth, ID band Patient awake    Reviewed: Allergy & Precautions, NPO status , Patient's Chart, lab work & pertinent test results, reviewed documented beta blocker date and time   History of Anesthesia Complications Negative for: history of anesthetic complications  Airway Mallampati: I  TM Distance: >3 FB Neck ROM: Full    Dental  (+) Dental Advisory Given, Missing   Pulmonary shortness of breath, neg sleep apnea, neg COPD, neg recent URI,    breath sounds clear to auscultation       Cardiovascular hypertension, Pt. on medications and Pt. on home beta blockers (-) angina+CHF  + dysrhythmias Atrial Fibrillation + Valvular Problems/Murmurs AS  Rhythm:Irregular + Systolic murmurs 1. Left ventricular ejection fraction, by estimation, is >55%. The left  ventricle has normal function. Left ventricular endocardial border not  optimally defined to evaluate regional wall motion. There is mild left  ventricular hypertrophy. Left  ventricular diastolic function could not be evaluated.  2. Right ventricular systolic function is normal. The right ventricular  size is normal.  3. Left atrial size was severely dilated.  4. The mitral valve was not well visualized. Mild mitral valve  regurgitation. No evidence of mitral stenosis. Moderate mitral annular  calcification.  5. The aortic valve was not well visualized. Aortic valve regurgitation  is not visualized. Moderate to severe aortic valve stenosis. Aortic valve  mean gradient measures 38.0 mmHg. Valve area cannot be calculated due to  poor image quality. Dimension less  index (VTI) is 0.26, consistent with borderline severe aortic stenosis.    Neuro/Psych negative neurological ROS  negative psych ROS   GI/Hepatic negative GI ROS, Neg liver ROS,   Endo/Other  diabetes, Type 2, Oral Hypoglycemic Agents  Renal/GU negative  Renal ROSLab Results      Component                Value               Date                      CREATININE               0.90                09/29/2021                Musculoskeletal   Abdominal   Peds  Hematology  (+) Blood dyscrasia, anemia , Lab Results      Component                Value               Date                      WBC                      13.1 (H)            09/25/2021                HGB                      12.2 (L)            09/29/2021                HCT  36.0 (L)            09/29/2021                MCV                      96.3                09/25/2021                PLT                      269                 09/25/2021              Anesthesia Other Findings   Reproductive/Obstetrics                             Anesthesia Physical Anesthesia Plan  ASA: 4  Anesthesia Plan: General   Post-op Pain Management:    Induction: Intravenous  PONV Risk Score and Plan: 2 and Ondansetron and Dexamethasone  Airway Management Planned: Oral ETT  Additional Equipment: Arterial line  Intra-op Plan:   Post-operative Plan: Extubation in OR  Informed Consent: I have reviewed the patients History and Physical, chart, labs and discussed the procedure including the risks, benefits and alternatives for the proposed anesthesia with the patient or authorized representative who has indicated his/her understanding and acceptance.     Dental advisory given  Plan Discussed with: CRNA and Anesthesiologist  Anesthesia Plan Comments:         Anesthesia Quick Evaluation

## 2021-09-29 NOTE — Progress Notes (Signed)
Pt arrived from PACU, VSS, CHG complete, orders released, groins level 0, oriented to unit, call light within reach.   Chrisandra Carota, RN 09/29/2021 11:13 AM

## 2021-09-29 NOTE — Op Note (Signed)
[facility]   MULTIDISCIPLINARY HEART VALVE TEAM   TAVR OPERATIVE NOTE   Date of Procedure:  09/29/2021  Preoperative Diagnosis: Severe Aortic Stenosis   Postoperative Diagnosis: Same   Procedure:   Transcatheter Aortic Valve Replacement - Percutaneous Right Transfemoral Approach  Edwards Sapien 3 Ultra THV (size 29 mm, model # 9600TFX, serial # [id number])   Co-Surgeons:  Gaye Pollack, MD and Sherren Mocha, MD  and Lenna Sciara, MD   Anesthesiologist:  Laurie Panda, MD  Echocardiographer:  Osborne Oman, MD  Pre-operative Echo Findings: Severe aortic stenosis Normal left ventricular systolic function  Post-operative Echo Findings: no paravalvular leak Normal left ventricular systolic function   BRIEF CLINICAL NOTE AND INDICATIONS FOR SURGERY  This 87 year old gentleman has stage D, severe, symptomatic aortic stenosis with New York Heart Association class II symptoms of exertional fatigue and shortness of breath consistent with chronic diastolic congestive heart failure.  He had recent hospitalization with acute diastolic congestive heart failure.  I have personally reviewed his 2D echocardiogram, cardiac catheterization, and CTA studies.  His echo images are suboptimal but the aortic valve appears calcified with a mean gradient of 38 mmHg and a peak gradient of 58 mmHg.  Dimensionless index is 0.22 consistent with severe aortic stenosis.  Left ventricular ejection fraction was greater than 55%.  Cardiac catheterization shows moderate nonobstructive RCA stenosis with otherwise no significant disease.  The mean gradient was measured at 55 mmHg with a peak gradient of 64 mmHg and a valve area of 0.93 cm consistent with severe aortic stenosis.  Right heart pressures were normal.  I agree that aortic valve replacement is indicated in this patient for relief of his symptoms and to prevent progressive left ventricular deterioration.  Given his advanced age and  limited mobility due to degenerative knee arthritis I think transcatheter aortic valve replacement would be the only option for treating him.  His gated cardiac CTA shows anatomy suitable for TAVR using a 29 mm sapient 3 valve.  His abdominal and pelvic CTA shows adequate pelvic vascular anatomy to allow transfemoral insertion.   The patient and his daughter were counseled at length regarding treatment alternatives for management of severe symptomatic aortic stenosis. The risks and benefits of surgical intervention has been discussed in detail. Long-term prognosis with medical therapy was discussed. Alternative approaches such as conventional surgical aortic valve replacement, transcatheter aortic valve replacement, and palliative medical therapy were compared and contrasted at length. This discussion was placed in the context of the patient's own specific clinical presentation and past medical history. All of their questions have been addressed.    Following the decision to proceed with transcatheter aortic valve replacement, a discussion was held regarding what types of management strategies would be attempted intraoperatively in the event of life-threatening complications, including whether or not the patient would be considered a candidate for the use of cardiopulmonary bypass and/or conversion to open sternotomy for attempted surgical intervention.  I do not think he is a candidate for emergent sternotomy to manage any intraoperative complications due to his advanced age and limited mobility.  The patient is aware of the fact that transient use of cardiopulmonary bypass may be necessary. The patient has been advised of a variety of complications that might develop including but not limited to risks of death, stroke, paravalvular leak, aortic dissection or other major vascular complications, aortic annulus rupture, device embolization, cardiac rupture or perforation, mitral regurgitation, acute myocardial  infarction, arrhythmia, heart block or bradycardia requiring permanent  pacemaker placement, congestive heart failure, respiratory failure, renal failure, pneumonia, infection, other late complications related to structural valve deterioration or migration, or other complications that might ultimately cause a temporary or permanent loss of functional independence or other long term morbidity. The patient provides full informed consent for the procedure as described and all questions were answered.              DETAILS OF THE OPERATIVE PROCEDURE  PREPARATION:    The patient was brought to the operating room on the above mentioned date and appropriate monitoring was established by the anesthesia team. The patient was placed in the supine position on the operating table.  Intravenous antibiotics were administered..  General endotracheal anesthesia was induced uneventfully because we could not get adequate images with transthoracic echo.  Baseline transesophageal echocardiogram was performed. The patient's abdomen and both groins were prepped and draped in a sterile manner. A time out procedure was performed.   PERIPHERAL ACCESS:    Using the modified Seldinger technique, femoral arterial and venous access was obtained with placement of 6 Fr sheaths on the left side.  A pigtail diagnostic catheter was passed through the left arterial sheath under fluoroscopic guidance into the aortic root.  A temporary transvenous pacemaker catheter was passed through the left femoral venous sheath under fluoroscopic guidance into the right ventricle.  The pacemaker was tested to ensure stable lead placement and pacemaker capture. Aortic root angiography was performed in order to determine the optimal angiographic angle for valve deployment.   TRANSFEMORAL ACCESS:   Percutaneous transfemoral access and sheath placement was performed using ultrasound guidance.  The right common femoral artery was cannulated using a  micropuncture needle and appropriate location was verified using hand injection angiogram.  A pair of Abbott Perclose percutaneous closure devices were placed and a 6 French sheath replaced into the femoral artery.  The patient was heparinized systemically and ACT verified > 250 seconds.    A 16 Fr transfemoral E-sheath was introduced into the right common femoral artery after progressively dilating over an Amplatz superstiff wire. An AL-1 catheter was used to direct a straight-tip exchange length wire across the native aortic valve into the left ventricle. This was exchanged out for a pigtail catheter and position was confirmed in the LV apex. Simultaneous LV and Ao pressures were recorded.  The pigtail catheter was exchanged for an Amplatz Extra-stiff wire in the LV apex.   BALLOON AORTIC VALVULOPLASTY:   Not performed   TRANSCATHETER HEART VALVE DEPLOYMENT:   An Edwards Sapien 3 Ultra transcatheter heart valve (size 29 mm) was prepared and crimped per manufacturer's guidelines, and the proper orientation of the valve is confirmed on the [employer] delivery system. The valve was advanced through the introducer sheath using normal technique until in an appropriate position in the abdominal aorta beyond the sheath tip. The balloon was then retracted and using the fine-tuning wheel was centered on the valve. The valve was then advanced across the aortic arch using appropriate flexion of the catheter. The valve was carefully positioned across the aortic valve annulus. The Commander catheter was retracted using normal technique. Once final position of the valve has been confirmed by angiographic assessment, the valve is deployed while temporarily holding ventilation and during rapid ventricular pacing to maintain systolic blood pressure < 50 mmHg and pulse pressure < 10 mmHg. The balloon inflation is held for >3 seconds after reaching full deployment volume. Once the balloon has fully deflated the  balloon is retracted  into the ascending aorta and valve function is assessed using echocardiography. There is felt to be no paravalvular leak and no central aortic insufficiency.  The patient's hemodynamic recovery following valve deployment is good.  The deployment balloon and guidewire are both removed.    PROCEDURE COMPLETION:   The sheath was removed and femoral artery closure performed.  Protamine was administered once femoral arterial repair was complete. The temporary pacemaker, pigtail catheters and femoral sheaths were removed with manual pressure used for hemostasis.  A Mynx femoral closure device was utilized following removal of the diagnostic sheath in the left femoral artery.  The patient tolerated the procedure well and is transported to the cath lab recovery area in stable condition. There were no immediate intraoperative complications. All sponge instrument and needle counts are verified correct at completion of the operation.   No blood products were administered during the operation.  The patient received a total of 40 mL of intravenous contrast during the procedure.   Gaye Pollack, MD 09/29/2021

## 2021-09-29 NOTE — Op Note (Signed)
[facility]   MULTIDISCIPLINARY HEART VALVE TEAM   TAVR OPERATIVE NOTE   Date of Procedure:  09/29/2021  Preoperative Diagnosis: Severe Aortic Stenosis   Postoperative Diagnosis: Same   Procedure:   Transcatheter Aortic Valve Replacement - Percutaneous  Transfemoral Approach  Edwards Sapien 3 Ultra THV (size 29 mm, model # 9750TFX, serial # [id number])   Co-Surgeons:  Gaye Pollack, MD, Lenna Sciara, MD, and Sherren Mocha, MD  Anesthesiologist:  Laurie Panda, MD  Echocardiographer:  Rudean Haskell, MD  Pre-operative Echo Findings: Severe aortic stenosis and mild AI Normal left ventricular systolic function  Post-operative Echo Findings: No paravalvular leak Unchanged/normal left ventricular systolic function  BRIEF CLINICAL NOTE AND INDICATIONS FOR SURGERY  87 year old gentleman with permanent atrial fibrillation on chronic rivaroxaban, type 2 diabetes, hypertension, and chronic diastolic heart failure.  He presents with progressive New York Heart Association functional class III symptoms associated with severe, symptomatic aortic stenosis.  He has undergone multidisciplinary heart team review and extensive preoperative imaging studies.  After review of his case, we have elected to proceed with transfemoral TAVR.  During the course of the patient's preoperative work up they have been evaluated comprehensively by a multidisciplinary team of specialists coordinated through the [facility] in the [facility].  They have been demonstrated to suffer from symptomatic severe aortic stenosis as noted above. The patient has been counseled extensively as to the relative risks and benefits of all options for the treatment of severe aortic stenosis including long term medical therapy, conventional surgery for aortic valve replacement, and transcatheter aortic valve replacement.  The patient has been independently  evaluated in formal cardiac surgical consultation by Dr Cyndia Bent, who deemed the patient appropriate for TAVR. Based upon review of all of the patient's preoperative diagnostic tests they are felt to be candidate for transcatheter aortic valve replacement using the transfemoral approach as an alternative to conventional surgery.    Following the decision to proceed with transcatheter aortic valve replacement, a discussion has been held regarding what types of management strategies would be attempted intraoperatively in the event of life-threatening complications, including whether or not the patient would be considered a candidate for the use of cardiopulmonary bypass and/or conversion to open sternotomy for attempted surgical intervention.  The patient has been advised of a variety of complications that might develop peculiar to this approach including but not limited to risks of death, stroke, paravalvular leak, aortic dissection or other major vascular complications, aortic annulus rupture, device embolization, cardiac rupture or perforation, acute myocardial infarction, arrhythmia, heart block or bradycardia requiring permanent pacemaker placement, congestive heart failure, respiratory failure, renal failure, pneumonia, infection, other late complications related to structural valve deterioration or migration, or other complications that might ultimately cause a temporary or permanent loss of functional independence or other long term morbidity.  The patient provides full informed consent for the procedure as described and all questions were answered preoperatively.  DETAILS OF THE OPERATIVE PROCEDURE  PREPARATION:   The patient is brought to the operating room on the above mentioned date and central monitoring was established by the anesthesia team including placement of a radial arterial line.  The patient was converted to general anesthesia because of lack of acoustic windows for echo imaging for  procedural guidance.  TEE was required.  The patient is placed in the supine position on the operating table.  Intravenous antibiotics are administered. General endotracheal anesthesia is induced uneventfully.    Baseline  transesophageal echocardiogram is performed. The patient's chest, abdomen, both groins, and both lower extremities are prepared and draped in a sterile manner. A time out procedure is performed.   PERIPHERAL ACCESS:   Using ultrasound guidance, femoral arterial and venous access is obtained with placement of 6 Fr sheaths on the left side.  [place] images are digitally captured and stored in the patient's chart. A pigtail diagnostic catheter was passed through the femoral arterial sheath under fluoroscopic guidance into the aortic root.  A temporary transvenous pacemaker catheter was passed through the femoral venous sheath under fluoroscopic guidance into the right ventricle.  The pacemaker was tested to ensure stable lead placement and pacemaker capture. Aortic root angiography was performed in order to determine the optimal angiographic angle for valve deployment.  TRANSFEMORAL ACCESS:  A micropuncture technique is used to access the right femoral artery under fluoroscopic and ultrasound guidance.  2 Perclose devices are deployed at 10' and 2' positions to 'PreClose' the femoral artery. An 8 French sheath is placed and then an Amplatz Superstiff wire is advanced through the sheath. This is changed out for a 16 French transfemoral E-Sheath after progressively dilating over the Superstiff wire.  An AL-2 catheter was used to direct a straight-tip exchange length wire across the native aortic valve into the left ventricle. This was exchanged out for a pigtail catheter and position was confirmed in the LV apex. Simultaneous LV and Ao pressures were recorded.  The pigtail catheter was exchanged for an Amplatz Extra-stiff wire in the LV apex.    BALLOON AORTIC VALVULOPLASTY:  Not  performed  TRANSCATHETER HEART VALVE DEPLOYMENT:  An Edwards Sapien 3 transcatheter heart valve (size 29 mm) was prepared and crimped per manufacturer's guidelines, and the proper orientation of the valve is confirmed on the [employer] delivery system. The valve was advanced through the introducer sheath using normal technique until in an appropriate position in the abdominal aorta beyond the sheath tip. The balloon was then retracted and using the fine-tuning wheel was centered on the valve. The valve was then advanced across the aortic arch using appropriate flexion of the catheter. The valve was carefully positioned across the aortic valve annulus. The Commander catheter was retracted using normal technique. Once final position of the valve has been confirmed by angiographic assessment, the valve is deployed while temporarily holding ventilation and during rapid ventricular pacing to maintain systolic blood pressure < 50 mmHg and pulse pressure < 10 mmHg. The balloon inflation is held for >3 seconds after reaching full deployment volume. Once the balloon has fully deflated the balloon is retracted into the ascending aorta and valve function is assessed using echocardiography. The patient's hemodynamic recovery following valve deployment is good.  The deployment balloon and guidewire are both removed. Echo demostrated acceptable post-procedural gradients, stable mitral valve function, and no aortic insufficiency.    PROCEDURE COMPLETION:  The sheath was removed and femoral artery closure is performed using the 2 previously deployed Perclose devices.  Protamine is administered once femoral arterial repair was complete. The site is clear with no evidence of bleeding or hematoma after the sutures are tightened. The temporary pacemaker and pigtail catheters are removed. Mynx closure is used for contralateral femoral arterial hemostasis for the 6 Fr sheath.  The patient tolerated the procedure well and  is transported to the recovery area in stable condition. There were no immediate intraoperative complications. All sponge instrument and needle counts are verified correct at completion of the operation.   The patient  received a total of 40 mL of intravenous contrast during the procedure.   Sherren Mocha, MD 09/29/2021 11:58 AM

## 2021-09-29 NOTE — Progress Notes (Signed)
  Echocardiogram Echocardiogram Transesophageal has been performed.  William Sawyer 09/29/2021, 10:04 AM

## 2021-09-29 NOTE — Anesthesia Procedure Notes (Signed)
Arterial Line Insertion Start/End10/10/2021 7:26 AM, 09/29/2021 7:30 AM Performed by: Oleta Mouse, MD, anesthesiologist  Patient location: Pre-op. Preanesthetic checklist: patient identified, IV checked, site marked, risks and benefits discussed, surgical consent, monitors and equipment checked, pre-op evaluation, timeout performed and anesthesia consent Lidocaine 1% used for infiltration Right, radial was placed Catheter size: 20 G Hand hygiene performed  and maximum sterile barriers used   Attempts: 1 Procedure performed using ultrasound guided (No image obtained) technique. Ultrasound Notes:anatomy identified, needle tip was noted to be adjacent to the nerve/plexus identified and no ultrasound evidence of intravascular and/or intraneural injection Following insertion, dressing applied and Biopatch. Post procedure assessment: normal and unchanged  Post procedure complications: second provider assisted. Patient tolerated the procedure well with no immediate complications.

## 2021-09-29 NOTE — Progress Notes (Signed)
Mobility Specialist Progress Note    09/29/21 1423  Mobility  Activity Ambulated in hall  Level of Assistance Contact guard assist, steadying assist  Assistive Device Front wheel walker  Distance Ambulated (ft) 240 ft  Mobility Ambulated with assistance in hallway  Mobility Response Tolerated well  Mobility performed by Mobility specialist;Nurse  $Mobility charge 1 Mobility   Pre-Mobility: 98 HR, 152/84 BP, 97% SpO2 Post-Mobility: 110 HR, 118/101 BP, 100% SpO2  Pt found in bed and agreeable. No complaints on walk. Pt HR ranged from 102-166 during due to afib rhythm. Returned to bed with call bell in reach and RN present.   Hildred Alamin Mobility Specialist  Mobility Specialist Phone: [phone]

## 2021-09-29 NOTE — Anesthesia Procedure Notes (Signed)
Procedure Name: Intubation Date/Time: 09/29/2021 8:22 AM Performed by: Barrington Ellison, CRNA Pre-anesthesia Checklist: Patient identified, Emergency Drugs available, Suction available and Patient being monitored Patient Re-evaluated:Patient Re-evaluated prior to induction Oxygen Delivery Method: Circle System Utilized Preoxygenation: Pre-oxygenation with 100% oxygen Induction Type: IV induction Ventilation: Mask ventilation without difficulty Laryngoscope Size: Mac and 4 Grade View: Grade I Tube type: Oral Tube size: 7.5 mm Number of attempts: 1 Airway Equipment and Method: Stylet and Oral airway Placement Confirmation: ETT inserted through vocal cords under direct vision, positive ETCO2 and breath sounds checked- equal and bilateral Secured at: 22 cm Tube secured with: Tape Dental Injury: Teeth and Oropharynx as per pre-operative assessment

## 2021-09-29 NOTE — Transfer of Care (Signed)
Immediate Anesthesia Transfer of Care Note  Patient: Khyri Hinzman  Procedure(s) Performed: TRANSCATHETER AORTIC VALVE REPLACEMENT, TRANSFEMORAL (Chest) TRANSESOPHAGEAL ECHOCARDIOGRAM (TEE)  Patient Location: Cath Lab  Anesthesia Type:General  Level of Consciousness: awake, alert  and oriented  Airway & Oxygen Therapy: Patient Spontanous Breathing and Patient connected to nasal cannula oxygen  Post-op Assessment: Report given to RN  Post vital signs: Reviewed and stable  Last Vitals:  Vitals Value Taken Time  BP 106/58 09/29/21 1005  Temp 36.4 C 09/29/21 1002  Pulse 68 09/29/21 1005  Resp 18 09/29/21 1005  SpO2 97 % 09/29/21 1005  Vitals shown include unvalidated device data.  Last Pain:  Vitals:   09/29/21 1002  TempSrc: Temporal  PainSc: 0-No pain      Patients Stated Pain Goal: 0 (36/12/24 4975)  Complications: No notable events documented.

## 2021-09-29 NOTE — Discharge Instructions (Signed)

## 2021-09-30 ENCOUNTER — Inpatient Hospital Stay (HOSPITAL_COMMUNITY): Payer: Medicare Other

## 2021-09-30 DIAGNOSIS — Z006 Encounter for examination for normal comparison and control in clinical research program: Secondary | ICD-10-CM | POA: Diagnosis not present

## 2021-09-30 DIAGNOSIS — I110 Hypertensive heart disease with heart failure: Secondary | ICD-10-CM | POA: Diagnosis not present

## 2021-09-30 DIAGNOSIS — I4821 Permanent atrial fibrillation: Secondary | ICD-10-CM | POA: Diagnosis not present

## 2021-09-30 DIAGNOSIS — I350 Nonrheumatic aortic (valve) stenosis: Secondary | ICD-10-CM | POA: Diagnosis not present

## 2021-09-30 DIAGNOSIS — Z952 Presence of prosthetic heart valve: Secondary | ICD-10-CM

## 2021-09-30 LAB — CBC
HCT: 34.5 % — ABNORMAL LOW (ref 39.0–52.0)
Hemoglobin: 11.7 g/dL — ABNORMAL LOW (ref 13.0–17.0)
MCH: 31.8 pg (ref 26.0–34.0)
MCHC: 33.9 g/dL (ref 30.0–36.0)
MCV: 93.8 fL (ref 80.0–100.0)
Platelets: 199 10*3/uL (ref 150–400)
RBC: 3.68 MIL/uL — ABNORMAL LOW (ref 4.22–5.81)
RDW: 13.4 % (ref 11.5–15.5)
WBC: 14.1 10*3/uL — ABNORMAL HIGH (ref 4.0–10.5)
nRBC: 0 % (ref 0.0–0.2)

## 2021-09-30 LAB — GLUCOSE, CAPILLARY
Glucose-Capillary: 110 mg/dL — ABNORMAL HIGH (ref 70–99)
Glucose-Capillary: 109 mg/dL — ABNORMAL HIGH (ref 70–99)

## 2021-09-30 LAB — BASIC METABOLIC PANEL
Anion gap: 8 (ref 5–15)
BUN: 16 mg/dL (ref 8–23)
CO2: 28 mmol/L (ref 22–32)
Calcium: 8.3 mg/dL — ABNORMAL LOW (ref 8.9–10.3)
Chloride: 93 mmol/L — ABNORMAL LOW (ref 98–111)
Creatinine, Ser: 0.91 mg/dL (ref 0.61–1.24)
GFR, Estimated: >60 mL/min (ref >60)
Glucose, Bld: 104 mg/dL — ABNORMAL HIGH (ref 70–99)
Potassium: 3.5 mmol/L (ref 3.5–5.1)
Sodium: 129 mmol/L — ABNORMAL LOW (ref 135–145)

## 2021-09-30 MED ORDER — PERFLUTREN LIPID MICROSPHERE
1.0000 mL | INTRAVENOUS | Status: AC | PRN
  Administered 2021-09-30: 3 mL via INTRAVENOUS
  Filled 2021-09-30: qty 10

## 2021-09-30 MED ORDER — FUROSEMIDE 40 MG PO TABS: 40.0000 mg | ORAL_TABLET | Freq: Every day | ORAL | Status: AC

## 2021-09-30 MED ORDER — METOPROLOL SUCCINATE ER 100 MG PO TB24
100.0000 mg | ORAL_TABLET | Freq: Every day | ORAL | Status: DC
  Administered 2021-09-30: 100 mg via ORAL
  Filled 2021-09-30: qty 1

## 2021-09-30 MED FILL — Magnesium Sulfate Inj 50%: INTRAMUSCULAR | Qty: 10 | Status: AC

## 2021-09-30 MED FILL — Heparin Sodium (Porcine) Inj 1000 Unit/ML: Qty: 1000 | Status: AC

## 2021-09-30 MED FILL — Potassium Chloride Inj 2 mEq/ML: INTRAVENOUS | Qty: 40 | Status: AC

## 2021-09-30 NOTE — Discharge Summary (Addendum)
[facility] TEAM  Discharge Summary    Patient ID: William Sawyer MRN: [id number]; DOB: 1933/07/18  Admit date: 09/29/2021 Discharge date: 09/30/2021  Primary Care Provider: [name], MD  Primary Cardiologist: Dr. Sherren Mocha, MD//Dr. Gilford Raid, MD (TAVR)  Discharge Diagnoses    Principal Problem:   S/P TAVR (transcatheter aortic valve replacement) Active Problems:   HTN (hypertension)   HLD (hyperlipidemia)   Atrial fibrillation, chronic (HCC)   Diabetes mellitus without complication (HCC)   Aortic valve stenosis   Chronic anticoagulation  Allergies Allergies  Allergen Reactions   Pregabalin Other (See Comments)    Pt unaware of this allergy   Amlodipine Besylate     Pt unaware of this allergy   Diltiazem Hcl     Pt unaware of this allergy   Diagnostic Studies/Procedures      TAVR OPERATIVE NOTE     Date of Procedure:                09/29/2021   Preoperative Diagnosis:      Severe Aortic Stenosis    Postoperative Diagnosis:    Same    Procedure:        Transcatheter Aortic Valve Replacement - Percutaneous  Transfemoral Approach             Edwards Sapien 3 Ultra THV (size 29 mm, model # 9750TFX, serial # [id number])              Co-Surgeons:                        Gaye Pollack, MD, Lenna Sciara, MD, and Sherren Mocha, MD   Anesthesiologist:                  Laurie Panda, MD   Echocardiographer:              Rudean Haskell, MD   Pre-operative Echo Findings: Severe aortic stenosis and mild AI Normal left ventricular systolic function   Post-operative Echo Findings: No paravalvular leak Unchanged/normal left ventricular systolic function _____________   Echo 09/30/21: Completed but pending formal read at the time of discharge   History of Present Illness     William Sawyer is a 87 y.o. male with a history of HTN, HLD, DM2, chronic afib on Xarelto, chronic diastolic CHF, orthopedic  mobility issues (uses motorized scooter) but still functionally independent, and severe AS who presented to [facility] on 09/29/21 for planned TAVR.   Hospital Course     Mr. Molchan was previously hospitalized with shortness of breath and was found to have signs and symptoms of acute diastolic heart failure. He was treated with IV Lasix with significant improvement in his symptoms. An echocardiogram performed during his hospitalization was suggestive of significant aortic stenosis. Echo noted to have very poor acoustic windows and difficult image quality. However, the demonstrated a mean gradient of 38 mmHg with a dimensionless index of 0.26, suggestive of at least moderately severe aortic stenosis. He was referred to the structural heart team further evaluation of his aortic stenosis and was seen by Dr. Burt Knack 08/06/21. Her was set up for pre-TAVR CTs and R/LHC.   Cath showed nonobstructive coronary artery disease with moderate mid RCA stenosis, wide patency of the left main, patency of the LAD with mild diffuse plaquing, and patency of the large left circumflex with mild diffuse plaquing. Severe calcific aortic stenosis with peak to peak transvalvular  gradient 64 mmHg, mean gradient 55 mmHg, aortic valve area 0.93 cm, aortic valve area index of 0.41 Essentially normal right heart pressures.   He ultimately underwent successful TAVR 09/29/21 with Edwards 34mm Sapien 3 Ultra THV without complications. Right and left groin sites stable without oozing, bleeding or hematoma. Groin site care reviewed. No high grade block on telemetry or EKG. He has permanent atrial fibrillation and is rather fast this AM due to holding Toprol prior to TAVR. Will restart this AM. Heis euvolemic on exam, therefore will reduce Lasix to 40mg  QD. 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 The need for SBE was discussed and ABX will be prescribed at follow  up in 1 week. He ambulated with cardiac rehab without issues.   Consultants: None   The patient has been seen and examined by Dr. Burt Knack who feels that he is stable and ready for discharge today, 09/30/21.  _____________  Discharge Vitals Blood pressure 120/75, pulse 100, temperature 98 F (36.7 C), temperature source Oral, resp. rate 17, height 6\' 1"  (1.854 m), weight 103.7 kg, SpO2 97 %.  Filed Weights   09/29/21 0547 09/30/21 0400  Weight: 103.9 kg 103.7 kg   General: Well developed, well nourished, NAD Neck: Negative for carotid bruits. No JVD Lungs:Clear to ausculation bilaterally. Breathing is unlabored. Cardiovascular: Irregularly irregular. Soft AS murmur Abdomen: Soft, non-tender, non-distended. No obvious abdominal masses. Extremities: No edema. Neuro: Alert and oriented. No focal deficits. No facial asymmetry. MAE spontaneously. Psych: Responds to questions appropriately with normal affect.    Labs & Radiologic Studies    CBC Recent Labs    09/29/21 1014 09/30/21 0152  WBC  --  14.1*  HGB 12.2* 11.7*  HCT 36.0* 34.5*  MCV  --  93.8  PLT  --  786   Basic Metabolic Panel Recent Labs    09/29/21 1014 09/30/21 0152  NA 135 129*  K 3.3* 3.5  CL 93* 93*  CO2  --  28  GLUCOSE 148* 104*  BUN 16 16  CREATININE 0.90 0.91  CALCIUM  --  8.3*   Liver Function Tests No results for input(s): AST, ALT, ALKPHOS, BILITOT, PROT, ALBUMIN in the last 72 hours. No results for input(s): LIPASE, AMYLASE in the last 72 hours. Cardiac Enzymes No results for input(s): CKTOTAL, CKMB, CKMBINDEX, TROPONINI in the last 72 hours. BNP Invalid input(s): POCBNP D-Dimer No results for input(s): DDIMER in the last 72 hours. Hemoglobin A1C No results for input(s): HGBA1C in the last 72 hours. Fasting Lipid Panel No results for input(s): CHOL, HDL, LDLCALC, TRIG, CHOLHDL, LDLDIRECT in the last 72 hours. Thyroid Function Tests No results for input(s): TSH, T4TOTAL, T3FREE,  THYROIDAB in the last 72 hours.  Invalid input(s): FREET3 _____________  DG Chest 2 View  Result Date: 09/28/2021 CLINICAL DATA:  Preop for transcatheter aortic valve repair. EXAM: CHEST - 2 VIEW COMPARISON:  July 25, 2021. FINDINGS: The heart size and mediastinal contours are within normal limits. No pneumothorax is noted. Left lung is clear. Minimal right basilar subsegmental atelectasis is noted with small right pleural effusion. The visualized skeletal structures are unremarkable. IMPRESSION: Minimal right basilar subsegmental atelectasis with small right pleural effusion. Electronically Signed   By: Marijo Conception M.D.   On: 09/28/2021 08:25   CT CORONARY MORPH W/CTA COR W/SCORE W/CA W/CM &/OR WO/CM  Addendum Date: 09/10/2021   ADDENDUM REPORT: 09/10/2021 19:59 CLINICAL DATA:  Severe  Aortic Stenosis. EXAM: Cardiac TAVR CT TECHNIQUE: A non-contrast, gated CT scan was obtained with axial slices of 3 mm through the heart for aortic valve calcium scoring. A 120 kV retrospective, gated, contrast cardiac scan was obtained. Gantry rotation speed was 250 msecs and collimation was 0.6 mm. Nitroglycerin was not given. The 3D data set was reconstructed in 5% intervals of the 0-95% of the R-R cycle. Systolic and diastolic phases were analyzed on a dedicated workstation using MPR, MIP, and VRT modes. The patient received 100 cc of contrast. FINDINGS: Image quality: Excellent. Noise artifact is: Limited. Valve Morphology: The aortic valve is tricuspid. The leaflets are severely calcified with severely restricted movement in systole. All leaflets contain dense bulky calcifications. Aortic Valve Calcium score: 6022 Aortic annular dimension: Phase assessed: 30% Annular area: 536 mm2 Annular perimeter: 83.1 mm Max diameter: 28.9 mm Min diameter: 24.1 mm Annular and subannular calcification: There is a single protruding calcification under the [place] that extends into the LVOT. Optimal coplanar projection: RAO 3 CRA 7  Coronary Artery Height above Annulus: Left Main: 12.8 mm Right Coronary: 21.4 mm Sinus of Valsalva Measurements: Non-coronary: 35 mm Right-coronary: 34 mm Left-coronary: 37 mm Sinus of Valsalva Height: Non-coronary: 24.7 mm Right-coronary: 23.4 mm Left-coronary: 20.7 mm Sinotubular Junction: 31 mm Ascending Thoracic Aorta: 35 mm Coronary Arteries: Normal coronary origin. Right dominance. The study was performed without use of NTG and is insufficient for plaque evaluation. Please refer to recent cardiac catheterization for coronary assessment. 3-vessel coronary calcifications noted. Cardiac Morphology: Right Atrium: Right atrial size is dilated. Right Ventricle: The right ventricular cavity is within normal limits. Left Atrium: Left atrial size is dilated. There is likely mixing artifact in the left atrial appendage but cannot exclude thrombus. Left Ventricle: The ventricular cavity size is within normal limits. There are no stigmata of prior infarction. There is no abnormal filling defect. Pulmonary arteries: Normal in size without proximal filling defect. Pulmonary veins: Normal pulmonary venous drainage. Pericardium: Normal thickness with no significant effusion or calcium present. Mitral Valve: The mitral valve is degenerative with moderate to severe annular calcium. Extra-cardiac findings: See attached radiology report for non-cardiac structures. IMPRESSION: 1. Tricuspid aortic valve with severe calcifications (calcium score = 6022). 2. Annular measurements appropriate for 26 mm S3 (536 mm2). 3. There is a single protruding calcification under the [place] that extends into the LVOT. 4. Sufficient coronary to annulus distance. 5. Optimal Fluoroscopic Angle for Delivery: RAO 3 CRA 7 6. There is likely mixing artifact in the left atrial appendage but cannot exclude thrombus. 7. Moderate to severe mitral annular calcification. [place] T. Audie Box, MD Electronically Signed   By: Eleonore Chiquito M.D.   On: 09/10/2021 19:59    Result Date: 09/10/2021 EXAM: OVER-READ INTERPRETATION  CT CHEST The following report is an over-read performed by radiologist Dr. Vinnie Langton of [facility], [place] on 09/10/2021. This over-read does not include interpretation of cardiac or coronary anatomy or pathology. The coronary calcium score/coronary CTA interpretation by the cardiologist is attached. COMPARISON:  None. FINDINGS: Extracardiac findings will be described separately under dictation for contemporaneously obtained CTA chest, abdomen and pelvis. IMPRESSION: Please see separate dictation for contemporaneously obtained CTA chest, abdomen and pelvis dated 09/10/2021 for full description of relevant extracardiac findings. Electronically Signed: By: Vinnie Langton M.D. On: 09/10/2021 10:48   CT ANGIO CHEST AORTA W/CM & OR WO/CM  Result Date: 09/10/2021 CLINICAL DATA:  87 year old male with history of severe aortic stenosis. Preprocedural study prior to potential transcatheter aortic  valve replacement (TAVR) procedure. EXAM: CT ANGIOGRAPHY CHEST, ABDOMEN AND PELVIS TECHNIQUE: Multidetector CT imaging through the chest, abdomen and pelvis was performed using the standard protocol during bolus administration of intravenous contrast. Multiplanar reconstructed images and MIPs were obtained and reviewed to evaluate the vascular anatomy. CONTRAST:  160mL OMNIPAQUE IOHEXOL 350 MG/ML SOLN COMPARISON:  No priors. FINDINGS: CTA CHEST FINDINGS Cardiovascular: Heart size is mildly enlarged with biatrial dilatation. There is no significant pericardial fluid, thickening or pericardial calcification. There is aortic atherosclerosis, as well as atherosclerosis of the great vessels of the mediastinum and the coronary arteries, including calcified atherosclerotic plaque in the left main, left anterior descending, left circumflex and right coronary arteries. Severe calcifications of the aortic valve. Mediastinum/Lymph Nodes: No pathologically enlarged  mediastinal or hilar lymph nodes. Please note that accurate exclusion of hilar adenopathy is limited on noncontrast CT scans. Esophagus is unremarkable in appearance. No axillary lymphadenopathy. Lungs/Pleura: Moderate right and trace left pleural effusions lying dependently. Some passive subsegmental atelectasis is noted in the lower lobes of the lungs bilaterally. No acute consolidative airspace disease. No definite suspicious appearing pulmonary nodules or masses are noted. Musculoskeletal/Soft Tissues: There are no aggressive appearing lytic or blastic lesions noted in the visualized portions of the skeleton. CTA ABDOMEN AND PELVIS FINDINGS Hepatobiliary: No suspicious cystic or solid hepatic lesions. No intra or extrahepatic biliary ductal dilatation. Gallbladder is normal in appearance. Pancreas: No pancreatic mass. No pancreatic ductal dilatation. No pancreatic or peripancreatic fluid collections or inflammatory changes. Spleen: Unremarkable. Adrenals/Urinary Tract: Bilateral kidneys and bilateral adrenal glands are normal in appearance. No hydroureteronephrosis. Urinary bladder is partially obscured by beam hardening artifact from the patient's left hip arthroplasty. Visualized portions of the urinary bladder are normal in appearance. Stomach/Bowel: The appearance of the stomach is normal. No pathologic dilatation of small bowel or colon. Several scattered colonic diverticulae are noted, without surrounding inflammatory changes to suggest an acute diverticulitis at this time. The appendix is not confidently identified and may be surgically absent. Regardless, there are no inflammatory changes noted adjacent to the cecum to suggest the presence of an acute appendicitis at this time. Vascular/Lymphatic: Aortic atherosclerosis, without evidence of aneurysm or dissection in the abdominal or pelvic vasculature. No lymphadenopathy noted in the abdomen or pelvis. Reproductive: Prostate gland and seminal vesicles  are unremarkable in appearance. Other: No significant volume of ascites.  No pneumoperitoneum. Musculoskeletal: Status post left hip arthroplasty. There are no aggressive appearing lytic or blastic lesions noted in the visualized portions of the skeleton. VASCULAR MEASUREMENTS PERTINENT TO TAVR: AORTA: Minimal Aortic Diameter-18 x 16 mm Severity of Aortic Calcification-severe RIGHT PELVIS: Right Common Iliac Artery - Minimal Diameter-10.6 x 13.1 mm Tortuosity-severe Calcification-moderate Right External Iliac Artery - Minimal Diameter-9.8 x 9.6 mm Tortuosity-severe Calcification-mild Right Common Femoral Artery - Minimal Diameter-9.8 x 6.3 mm Tortuosity-mild Calcification-moderate LEFT PELVIS: Left Common Iliac Artery - Minimal Diameter-11.9 x 9.3 mm Tortuosity-very severe Calcification-moderate Left External Iliac Artery - Minimal Diameter-10.6 x 7.3 mm Tortuosity-moderate Calcification-mild Left Common Femoral Artery - Minimal Diameter-11.3 x 4.7 mm Tortuosity-mild Calcification-moderate to severe Review of the MIP images confirms the above findings. IMPRESSION: 1. Vascular findings and measurements pertinent to potential TAVR procedure, as detailed above. 2. Severe thickening calcification of the aortic valve, compatible with reported clinical history of severe aortic stenosis. 3. Aortic atherosclerosis, in addition to left main and 3 vessel coronary artery disease. 4. Cardiomegaly with biatrial dilatation 5. Moderate right and trace left pleural effusions lying dependently with some passive subsegmental atelectasis  in the lower lobes of the lungs bilaterally (right greater than left). 6. Colonic diverticulosis without evidence of acute diverticulitis at this time. 7. Additional incidental findings, as above. Electronically Signed   By: Vinnie Langton M.D.   On: 09/10/2021 12:12   ECHO TEE  Result Date: 09/29/2021    TRANSESOPHOGEAL ECHO REPORT   Patient Name:   William Sawyer Date of Exam: 09/29/2021  Medical Rec #:  [id number]     Height:       73.0 in Accession #:    [id number]    Weight:       229.0 lb Date of Birth:  [birth year]    BSA:          2.279 m Patient Age:    89 years      BP:           151/92 mmHg Patient Gender: M             HR:           97 bpm. Exam Location:  Inpatient Procedure: TEE-Intraopertive and 3D Echo Indications:     Aortic stenosis  History:         Patient has prior history of Echocardiogram examinations, most                  recent 07/21/2021. CHF, Aortic Valve Disease and Severe AS,                  Arrythmias:Atrial Fibrillation, Signs/Symptoms:Shortness of                  Breath; Risk Factors:Hypertension, Diabetes and Dyslipidemia.  Sonographer:     Dustin Flock RDCS Referring Phys:  [id number] Woodfin Ganja THOMPSON Diagnosing Phys: Rudean Haskell MD PROCEDURE: The transesophogeal probe was passed without difficulty through the esophogus of the patient. Sedation performed by different physician. The patient developed no complications during the procedure. IMPRESSIONS  1. Interventional TEE for assessment of aortic valve during TAVR procedure. Preprocedure the aortic valve was calcified wth a mean gradient of 56 mm Hg and with central aortic valve regurgitation that was mild to moderate. Placement of 29 mm Edwards Sapien Valve. After this there was no aortic stenosis with mean gradient of 2 mm Hg. No paravalvular leak.  2. Left atrial size was severely dilated. No left atrial/left atrial appendage thrombus was preprocedure with emptying elocinty of 22 cm/s and smoke in the appendage. Post implantation there was increasing consolidation in the left atrial appendage consistent with early thrombus formation.  3. Left ventricular ejection fraction, by estimation, is 50%. The left ventricle has low normal function. The left ventricle demonstrates global hypokinesis.  4. Right ventricular systolic function is normal. The right ventricular size is normal. Tricuspid regurgitation  signal is inadequate for assessing PA pressure.  5. Right atrial size was severely dilated.  6. The mitral valve is degenerative. Trivial mitral valve regurgitation. At least mild mitral stenosis. Moderate to severe mitral annular calcification. Mean gradient of 2.7 mm Hg at heart rate of 88 bpm.  7. There is mild (Grade II) plaque involving the descending aorta. FINDINGS  Left Ventricle: Left ventricular ejection fraction, by estimation, is 50%. The left ventricle has low normal function. The left ventricle demonstrates global hypokinesis. The left ventricular internal cavity size was normal in size. Right Ventricle: The right ventricular size is normal. Right vetricular wall thickness was not assessed. Right ventricular systolic function is normal. Tricuspid regurgitation signal is inadequate for assessing  PA pressure. Left Atrium: Left atrial size was severely dilated. No left atrial/left atrial appendage thrombus was detected. Right Atrium: Right atrial size was severely dilated. Pericardium: There is no evidence of pericardial effusion. Mitral Valve: Continuity equation assessment of MS in the setting of AI. The mitral valve is degenerative in appearance. Moderate to severe mitral annular calcification. Trivial mitral valve regurgitation. Mild mitral valve stenosis. Tricuspid Valve: The tricuspid valve is normal in structure. Tricuspid valve regurgitation is trivial. Aortic Valve: The aortic valve is calcified. Aortic valve regurgitation is mild to moderate. Aortic regurgitation PHT measures 354 msec. Aortic valve mean gradient measures 33.0 mmHg. Aortic valve peak gradient measures 42.7 mmHg. Aortic valve area, by VTI measures 0.77 cm. There is a 29 mm Sapien prosthetic, stented (TAVR) valve present in the aortic position. Pulmonic Valve: The pulmonic valve was grossly normal. Pulmonic valve regurgitation is trivial. Aorta: The aortic root, ascending aorta, aortic arch and descending aorta are all  structurally normal, with no evidence of dilitation or obstruction. There is mild (Grade II) plaque involving the descending aorta. IAS/Shunts: The atrial septum is grossly normal.  LEFT VENTRICLE PLAX 2D LVOT diam:     2.20 cm LV SV:         60 LV SV Index:   26 LVOT Area:     3.80 cm  AORTIC VALVE AV Area (Vmax):    0.93 cm AV Area (Vmean):   0.84 cm AV Area (VTI):     0.77 cm AV Vmax:           326.60 cm/s AV Vmean:          240.767 cm/s AV VTI:            0.774 m AV Peak Grad:      42.7 mmHg AV Mean Grad:      33.0 mmHg LVOT Vmax:         79.58 cm/s LVOT Vmean:        53.325 cm/s LVOT VTI:          0.158 m LVOT/AV VTI ratio: 0.20 AI PHT:            354 msec  SHUNTS Systemic VTI:  0.16 m Systemic Diam: 2.20 cm Rudean Haskell MD Electronically signed by Rudean Haskell MD Signature Date/Time: 09/29/2021/10:26:05 AM    Final    VAS US CAROTID  Result Date: 09/10/2021 Carotid Arterial Duplex Study Patient Name:  William Sawyer  Date of Exam:   09/10/2021 Medical Rec #: [id number]      Accession #:    [id number] Date of Birth: 1933-05-03     Patient Gender: M Patient Age:   73 years Exam Location:  [facility] Procedure:      VAS US CAROTID Referring Phys: Sherren Mocha --------------------------------------------------------------------------------  Indications:       Pre operative TAVR. Risk Factors:      Hypertension, hyperlipidemia, Diabetes. Other Factors:     Permanent Atrial fibrillation. Comparison Study:  No prior study on file Performing Technologist: Sharion Dove RVS  Examination Guidelines: A complete evaluation includes B-mode imaging, spectral Doppler, color Doppler, and power Doppler as needed of all accessible portions of each vessel. Bilateral testing is considered an integral part of a complete examination. Limited examinations for reoccurring indications may be performed as noted.  Right Carotid Findings: +----------+--------+--------+--------+------------------+--------+            PSV cm/sEDV cm/sStenosisPlaque DescriptionComments +----------+--------+--------+--------+------------------+--------+ CCA Prox  70      16  heterogenous               +----------+--------+--------+--------+------------------+--------+ CCA Distal49      11              heterogenous               +----------+--------+--------+--------+------------------+--------+ ICA Prox  75      26              calcific                   +----------+--------+--------+--------+------------------+--------+ ICA Distal64      27                                         +----------+--------+--------+--------+------------------+--------+ ECA       68      10                                         +----------+--------+--------+--------+------------------+--------+ +----------+--------+-------+--------+-------------------+           PSV cm/sEDV cmsDescribeArm Pressure (mmHG) +----------+--------+-------+--------+-------------------+ [id number]                                         +----------+--------+-------+--------+-------------------+  Left Carotid Findings: +----------+--------+--------+--------+----------------------+--------+           PSV cm/sEDV cm/sStenosisPlaque Description    Comments +----------+--------+--------+--------+----------------------+--------+ CCA Prox  83      18              calcific and irregular         +----------+--------+--------+--------+----------------------+--------+ CCA Distal63      16              calcific and irregular         +----------+--------+--------+--------+----------------------+--------+ ICA Prox  55      16              calcific                       +----------+--------+--------+--------+----------------------+--------+ ICA Distal65      18                                             +----------+--------+--------+--------+----------------------+--------+ ECA       75      9                                               +----------+--------+--------+--------+----------------------+--------+ +----------+--------+--------+--------+-------------------+           PSV cm/sEDV cm/sDescribeArm Pressure (mmHG) +----------+--------+--------+--------+-------------------+ [id number]                                          +----------+--------+--------+--------+-------------------+ +---------+--------+--+--------+--+ VertebralPSV cm/s60EDV cm/s16 +---------+--------+--+--------+--+   Summary: Right Carotid: Velocities in the right ICA are consistent with a 1-39% stenosis. Left Carotid: Velocities in the left ICA  are consistent with a 1-39% stenosis. Vertebrals:  Left vertebral artery demonstrates antegrade flow. Right vertebral              artery was not visualized. Subclavians: Normal flow hemodynamics were seen in bilateral subclavian              arteries. *See table(s) above for measurements and observations.  Electronically signed by Jamelle Haring on 09/10/2021 at 6:59:23 PM.    Final    Structural Heart Procedure  Result Date: 09/29/2021 See surgical note for result.  CT ANGIO ABDOMEN PELVIS  W &/OR WO CONTRAST  Result Date: 09/10/2021 CLINICAL DATA:  87 year old male with history of severe aortic stenosis. Preprocedural study prior to potential transcatheter aortic valve replacement (TAVR) procedure. EXAM: CT ANGIOGRAPHY CHEST, ABDOMEN AND PELVIS TECHNIQUE: Multidetector CT imaging through the chest, abdomen and pelvis was performed using the standard protocol during bolus administration of intravenous contrast. Multiplanar reconstructed images and MIPs were obtained and reviewed to evaluate the vascular anatomy. CONTRAST:  163mL OMNIPAQUE IOHEXOL 350 MG/ML SOLN COMPARISON:  No priors. FINDINGS: CTA CHEST FINDINGS Cardiovascular: Heart size is mildly enlarged with biatrial dilatation. There is no significant pericardial fluid, thickening or pericardial  calcification. There is aortic atherosclerosis, as well as atherosclerosis of the great vessels of the mediastinum and the coronary arteries, including calcified atherosclerotic plaque in the left main, left anterior descending, left circumflex and right coronary arteries. Severe calcifications of the aortic valve. Mediastinum/Lymph Nodes: No pathologically enlarged mediastinal or hilar lymph nodes. Please note that accurate exclusion of hilar adenopathy is limited on noncontrast CT scans. Esophagus is unremarkable in appearance. No axillary lymphadenopathy. Lungs/Pleura: Moderate right and trace left pleural effusions lying dependently. Some passive subsegmental atelectasis is noted in the lower lobes of the lungs bilaterally. No acute consolidative airspace disease. No definite suspicious appearing pulmonary nodules or masses are noted. Musculoskeletal/Soft Tissues: There are no aggressive appearing lytic or blastic lesions noted in the visualized portions of the skeleton. CTA ABDOMEN AND PELVIS FINDINGS Hepatobiliary: No suspicious cystic or solid hepatic lesions. No intra or extrahepatic biliary ductal dilatation. Gallbladder is normal in appearance. Pancreas: No pancreatic mass. No pancreatic ductal dilatation. No pancreatic or peripancreatic fluid collections or inflammatory changes. Spleen: Unremarkable. Adrenals/Urinary Tract: Bilateral kidneys and bilateral adrenal glands are normal in appearance. No hydroureteronephrosis. Urinary bladder is partially obscured by beam hardening artifact from the patient's left hip arthroplasty. Visualized portions of the urinary bladder are normal in appearance. Stomach/Bowel: The appearance of the stomach is normal. No pathologic dilatation of small bowel or colon. Several scattered colonic diverticulae are noted, without surrounding inflammatory changes to suggest an acute diverticulitis at this time. The appendix is not confidently identified and may be surgically  absent. Regardless, there are no inflammatory changes noted adjacent to the cecum to suggest the presence of an acute appendicitis at this time. Vascular/Lymphatic: Aortic atherosclerosis, without evidence of aneurysm or dissection in the abdominal or pelvic vasculature. No lymphadenopathy noted in the abdomen or pelvis. Reproductive: Prostate gland and seminal vesicles are unremarkable in appearance. Other: No significant volume of ascites.  No pneumoperitoneum. Musculoskeletal: Status post left hip arthroplasty. There are no aggressive appearing lytic or blastic lesions noted in the visualized portions of the skeleton. VASCULAR MEASUREMENTS PERTINENT TO TAVR: AORTA: Minimal Aortic Diameter-18 x 16 mm Severity of Aortic Calcification-severe RIGHT PELVIS: Right Common Iliac Artery - Minimal Diameter-10.6 x 13.1 mm Tortuosity-severe Calcification-moderate Right External Iliac Artery - Minimal Diameter-9.8 x 9.6 mm Tortuosity-severe Calcification-mild Right Common  Femoral Artery - Minimal Diameter-9.8 x 6.3 mm Tortuosity-mild Calcification-moderate LEFT PELVIS: Left Common Iliac Artery - Minimal Diameter-11.9 x 9.3 mm Tortuosity-very severe Calcification-moderate Left External Iliac Artery - Minimal Diameter-10.6 x 7.3 mm Tortuosity-moderate Calcification-mild Left Common Femoral Artery - Minimal Diameter-11.3 x 4.7 mm Tortuosity-mild Calcification-moderate to severe Review of the MIP images confirms the above findings. IMPRESSION: 1. Vascular findings and measurements pertinent to potential TAVR procedure, as detailed above. 2. Severe thickening calcification of the aortic valve, compatible with reported clinical history of severe aortic stenosis. 3. Aortic atherosclerosis, in addition to left main and 3 vessel coronary artery disease. 4. Cardiomegaly with biatrial dilatation 5. Moderate right and trace left pleural effusions lying dependently with some passive subsegmental atelectasis in the lower lobes of the lungs  bilaterally (right greater than left). 6. Colonic diverticulosis without evidence of acute diverticulitis at this time. 7. Additional incidental findings, as above. Electronically Signed   By: Vinnie Langton M.D.   On: 09/10/2021 12:12   Disposition   Pt is being discharged home today in good condition.  Follow-up Plans & Appointments    Follow-up Information     Eileen Stanford, PA-C. Go on 10/07/2021.   Specialties: Cardiology, Radiology Why: at 2pm. Please arrive by 1:45pm Contact information: [place] [address] [phone]                Discharge Medications   Allergies as of 09/30/2021       Reactions   Pregabalin Other (See Comments)   Pt unaware of this allergy   Amlodipine Besylate    Pt unaware of this allergy   Diltiazem Hcl    Pt unaware of this allergy        Medication List     STOP taking these medications    sulfamethoxazole-trimethoprim 800-160 MG tablet Commonly known as: BACTRIM DS       TAKE these medications    allopurinol 300 MG tablet Commonly known as: ZYLOPRIM Take 300 mg by mouth daily.   furosemide 40 MG tablet Commonly known as: LASIX Take 1 tablet (40 mg total) by mouth daily. What changed: how much to take   IRON PO Take 1 tablet by mouth every evening.   Lantus SoloStar 100 UNIT/ML Solostar Pen Generic drug: insulin glargine Inject 0-10 Units into the skin at bedtime as needed (blood sugar over 150).   metFORMIN 1000 MG tablet Commonly known as: GLUCOPHAGE Take 500-1,000 mg by mouth See admin instructions. Take 1000 mg by mouth in the morning and 500 mg at night. Notes to patient: Can restart 10/01/21   metoprolol succinate 100 MG 24 hr tablet Commonly known as: TOPROL-XL Take 1 tablet (100 mg total) by mouth daily. Take with or immediately following a meal.   simvastatin 20 MG tablet Commonly known as: ZOCOR Take 20 mg by mouth at bedtime.   Xarelto 20 MG Tabs  tablet Generic drug: rivaroxaban Take 20 mg by mouth daily.        Outstanding Labs/Studies   None   Duration of Discharge Encounter   Greater than 30 minutes including physician time.  SignedKathyrn Drown, NP 09/30/2021, 11:19 AM [phone]   Patient seen, examined. Available data reviewed. Agree with findings, assessment, and plan as outlined by Kathyrn Drown, NP.  The patient is independently interviewed and examined.  He is a delightful elderly man in no distress.  Lungs are clear, heart is regular rate and rhythm with no murmur or  gallop, abdomen is soft and nontender, bilateral groin sites are clear with no hematoma or ecchymosis, extremities have no pretibial edema, skin is warm and dry.  The patient's postoperative echo is reviewed and shows normal transcatheter heart valve function with no significant paravalvular regurgitation and normal transvalvular gradients.  The patient's heart rhythm has been stable with rate controlled atrial fibrillation.  He has been restarted on rivaroxaban because of heavy smoke in the left atrial appendage.  Follow-up is arranged as above.  Sherren Mocha, M.D. 09/30/2021 3:23 PM

## 2021-09-30 NOTE — Anesthesia Postprocedure Evaluation (Signed)
Anesthesia Post Note  Patient: William Sawyer  Procedure(s) Performed: TRANSCATHETER AORTIC VALVE REPLACEMENT, TRANSFEMORAL (Chest) TRANSESOPHAGEAL ECHOCARDIOGRAM (TEE)     Patient location during evaluation: Cath Lab Anesthesia Type: General Level of consciousness: patient cooperative and awake Pain management: pain level controlled Vital Signs Assessment: post-procedure vital signs reviewed and stable Respiratory status: spontaneous breathing, nonlabored ventilation, respiratory function stable and patient connected to nasal cannula oxygen Cardiovascular status: blood pressure returned to baseline and stable Postop Assessment: no apparent nausea or vomiting Anesthetic complications: no   No notable events documented.  Last Vitals:  Vitals:   09/30/21 0400 09/30/21 1025  BP: 132/83 120/75  Pulse: 97 100  Resp: 20 17  Temp: 36.6 C 36.7 C  SpO2: 96% 97%    Last Pain:  Vitals:   09/30/21 1025  TempSrc: Oral  PainSc:                  [name]

## 2021-09-30 NOTE — Progress Notes (Signed)
Pt just ambulated with MT. Felt well but HR up 150-160s afib. Pt sts he hasn't had his meds yet. Discussed restrictions for home. He plans to move more but I did not give him formal walking guidelines due to orthopedic limitations. He is not interested in CRPII.  [phone] William Sawyer CES, ACSM 10:48 AM 09/30/2021

## 2021-09-30 NOTE — Progress Notes (Signed)
Discharge instructions provided to patient. Discharge instructions, medications and follow up appointments discussed. IV out. Monitor off CCMD notified. Discharging to home with family.  Era Bumpers, RN

## 2021-09-30 NOTE — Progress Notes (Signed)
Echocardiogram 2D Echocardiogram has been performed.  Oneal Deputy Divya Munshi RDCS 09/30/2021, 8:50 AM

## 2021-09-30 NOTE — Progress Notes (Signed)
Mobility Specialist Progress Note    09/30/21 1035  Mobility  Activity Ambulated in hall  Level of Assistance Modified independent, requires aide device or extra time  Assistive Device Front wheel walker  Distance Ambulated (ft) 120 ft  Mobility Ambulated with assistance in hallway  Mobility Response Tolerated well  Mobility performed by Mobility specialist  Bed Position Chair  $Mobility charge 1 Mobility   Pre-Mobility: 96 HR, 120/75 BP Post-Mobility: 117 HR  Pt received in chair and agreeable. HR ranged from 145-167 during walk but no complaints. Returned to chair with call bell in reach.   Hildred Alamin Mobility Specialist  Mobility Specialist Phone: [phone]

## 2021-10-01 ENCOUNTER — Telehealth: Payer: Self-pay | Admitting: Cardiology

## 2021-10-01 LAB — ECHO TEE
AR max vel: 0.93 cm2
AV Area VTI: 0.77 cm2
AV Area mean vel: 0.84 cm2
AV Mean grad: 33 mmHg
AV Peak grad: 42.7 mmHg
Ao pk vel: 3.27 m/s
P 1/2 time: 354 msec

## 2021-10-01 LAB — ECHOCARDIOGRAM COMPLETE
AR max vel: 1.65 cm2
AV Area VTI: 1.37 cm2
AV Area mean vel: 1.53 cm2
AV Mean grad: 8 mmHg
AV Peak grad: 13 mmHg
Ao pk vel: 1.8 m/s
Height: 73 in
Weight: 3657.6 oz

## 2021-10-01 NOTE — Telephone Encounter (Signed)
  [place] VALVE TEAM   Patient contacted regarding discharge from [facility] on 09/30/21  Patient understands to follow up with Structural Heart APP on 10/19 at [place].  Patient understands discharge instructions? yes Patient understands medications and regimen? yes Patient understands to bring all medications to this visit? yes  Kathyrn Drown NP-C

## 2021-10-05 ENCOUNTER — Telehealth: Payer: Self-pay | Admitting: Physician Assistant

## 2021-10-05 ENCOUNTER — Ambulatory Visit: Payer: Medicare Other | Admitting: Family

## 2021-10-05 ENCOUNTER — Other Ambulatory Visit: Payer: Self-pay | Admitting: Physician Assistant

## 2021-10-05 MED ORDER — MOLNUPIRAVIR EUA 200MG CAPSULE: 4.0000 | ORAL_CAPSULE | Freq: Two times a day (BID) | ORAL | 0 refills | Status: AC

## 2021-10-05 NOTE — Telephone Encounter (Signed)
  [place] VALVE TEAM   Patient called into cancel his appt for Wednesday due to having COVID with a symptom onset of 10/04/21. I have moved his appt out a week and called in molnupirvar for him (no paxlovid given Xarelto use).  Angelena Form PA-C  [facility]

## 2021-10-06 ENCOUNTER — Encounter (HOSPITAL_COMMUNITY): Payer: Self-pay | Admitting: Cardiovascular Disease

## 2021-10-07 ENCOUNTER — Ambulatory Visit: Payer: Medicare Other | Admitting: Physician Assistant

## 2021-10-12 NOTE — Progress Notes (Signed)
HEART AND [place]                                     Cardiology Office Note:    Date:  10/14/2021   ID:  William Sawyer, DOB January 16, 1933, MRN [id number]  PCP:  [name], MD  [facility] Cardiologist: Dr. Sherren Mocha, MD//Dr. Gilford Raid, MD (TAVR)   Referring MD: [name], MD   Chief Complaint  Patient presents with   Follow-up    1 week s/p TAVR   History of Present Illness:    William Sawyer is a 87 y.o. male with a hx of HTN, HLD, DM2, chronic afib on Xarelto, chronic diastolic CHF, orthopedic mobility issues (uses motorized scooter) but still functionally independent, and severe AS who presented to [facility] on 09/29/21 for planned TAVR.   William Sawyer was previously hospitalized with shortness of breath and was found to have signs and symptoms of acute diastolic heart failure. He was treated with IV Lasix with significant improvement in his symptoms. An echocardiogram performed during his hospitalization was suggestive of significant aortic stenosis. Echo noted to have very poor acoustic windows and difficult image quality. However, the demonstrated a mean gradient of 38 mmHg with a dimensionless index of 0.26, suggestive of at least moderately severe aortic stenosis. He was referred to the structural heart team further evaluation of his aortic stenosis and was seen by Dr. Burt Knack 08/06/21. He was set up for pre-TAVR CTs and R/LHC.    Cath showed nonobstructive coronary artery disease with moderate mid RCA stenosis, wide patency of the left main, patency of the LAD with mild diffuse plaquing, and patency of the large left circumflex with mild diffuse plaquing. Severe calcific aortic stenosis with peak to peak transvalvular gradient 64 mmHg, mean gradient 55 mmHg, aortic valve area 0.93 cm, aortic valve area index of 0.41 Essentially normal right heart pressures.    He ultimately underwent successful TAVR 09/29/21 with Edwards 4mm  Sapien 3 Ultra THV without complications. Right and left groin sites stable without oozing, bleeding or hematoma. Groin site care reviewed. No high grade block on telemetry or EKG. He has permanent atrial fibrillation and is rather fast this AM due to holding Toprol prior to TAVR. Will restart this AM. Heis euvolemic on exam, therefore will reduce Lasix to 40mg  QD. 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 The need for SBE was discussed and ABX will be prescribed at follow up in 1 week. He ambulated with cardiac rehab without issues.    Today he is here alone and reports he is doing very well. He feels "like a new man and is so pleased with how he feels." States he is [place] closer to [facility] for gambling. He has no SOB, chest pain, LE edema, palpitations, dizziness or syncope. He is tolerating medications well with no issues.   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 Cooper/Bartle   Past Surgical History:  Procedure Laterality Date   CATARACT EXTRACTION Bilateral    KNEE SURGERY Right    x5   Left hip replacement     REPLACEMENT TOTAL KNEE BILATERAL Bilateral    RIGHT/LEFT HEART CATH AND CORONARY ANGIOGRAPHY N/A 08/28/2021   Procedure: RIGHT/LEFT HEART CATH AND CORONARY ANGIOGRAPHY;  Surgeon: Sherren Mocha, MD;  Location: [place] CV [facility];  Service: Cardiovascular;  Laterality: N/A;   TEE WITHOUT CARDIOVERSION N/A 09/29/2021   Procedure: TRANSESOPHAGEAL ECHOCARDIOGRAM (TEE);  Surgeon: Sherren Mocha, MD;  Location: [place];  Service:  Open Heart Surgery;  Laterality: N/A;   TRANSCATHETER AORTIC VALVE REPLACEMENT, TRANSFEMORAL N/A 09/29/2021   Procedure: TRANSCATHETER AORTIC VALVE REPLACEMENT, TRANSFEMORAL;  Surgeon: Sherren Mocha, MD;  Location: [place];  Service: Open Heart Surgery;  Laterality: N/A;    Current Medications: Current Meds  Medication Sig   allopurinol (ZYLOPRIM) 300 MG tablet Take 300 mg by mouth daily.   amoxicillin (AMOXIL) 500 MG tablet Take 2,000 mg (4 tablets) 1 hour prior to dental procedures.   Ferrous Sulfate (IRON PO) Take 1 tablet by mouth every evening.   furosemide (LASIX) 40 MG tablet Take 1 tablet (40 mg total) by mouth daily.   LANTUS SOLOSTAR 100 UNIT/ML Solostar Pen Inject 0-10 Units into the skin at bedtime as needed (blood sugar over 150).   metFORMIN (GLUCOPHAGE) 1000 MG tablet Take 500-1,000 mg by mouth See admin instructions. Take 1000 mg by mouth in the morning and 500 mg at night.   metoprolol succinate (TOPROL-XL) 100 MG 24 hr tablet Take 1 tablet (100 mg total) by mouth daily. Take with or immediately following a meal.   simvastatin (ZOCOR) 20 MG tablet Take 20 mg by mouth at bedtime.   XARELTO 20 MG TABS tablet Take 20 mg by mouth daily.     Allergies:   Pregabalin, Amlodipine besylate, and Diltiazem hcl   Social History   Socioeconomic History   Marital status: Married    Spouse name: Not on file   Number of children: Not on file   Years of education: Not on file   Highest education level: Not on file  Occupational History   Not on file  Tobacco Use   Smoking status: Never   Smokeless tobacco: Never  Vaping Use   Vaping Use: Never used  Substance and Sexual Activity   Alcohol use: Not Currently   Drug use: Never   Sexual activity: Not on file  Other Topics Concern   Not on file  Social History Narrative   Not on file   Social Determinants of Health   Financial Resource Strain: Not on file  Food Insecurity: Not on file  Transportation Needs: Not on file   Physical Activity: Not on file  Stress: Not on file  Social Connections: Not on file     Family History: The patient's family history includes Breast cancer in his mother; Pancreatic cancer in his father.  ROS:   Please see the history of present illness.    All other systems reviewed and are negative.  EKGs/Labs/Other Studies Reviewed:    The following studies were reviewed today:  Echocardiogram 09/30/21:   1. The aortic valve has been replaced by a 29 mm Edwards Sapien Valve.  Aortic valve regurgitation is not visualized. Aortic valve mean gradient  measures 8.0 mmHg.   2. Post Procedure peak gradient 13 mm Hg, mean gradient 8 mm Hg, DVI 0.4,  EOA 1.6 cm2.  3. Left ventricular ejection fraction, by estimation, is 55 to 60%. The  left ventricle has normal function. Left ventricular endocardial border  not optimally defined to evaluate regional wall motion. There is mild  concentric left ventricular  hypertrophy. Left ventricular diastolic function could not be evaluated.   4. Right ventricular systolic function is low normal. The right  ventricular size is normal. Tricuspid regurgitation signal is inadequate  for assessing PA pressure.   5. Left atrial size was mildly dilated.   6. The mitral valve was not well visualized. No evidence of mitral valve  regurgitation. Moderate mitral annular calcification.   7. The inferior vena cava is dilated in size with >50% respiratory  variability, suggesting right atrial pressure of 8 mmHg.     TAVR OPERATIVE NOTE     Date of Procedure:                09/29/2021   Preoperative Diagnosis:      Severe Aortic Stenosis    Postoperative Diagnosis:    Same    Procedure:        Transcatheter Aortic Valve Replacement - Percutaneous  Transfemoral Approach             Edwards Sapien 3 Ultra THV (size 29 mm, model # 9750TFX, serial # [id number])              Co-Surgeons:                        Gaye Pollack, MD, Lenna Sciara, MD,  and Sherren Mocha, MD   Anesthesiologist:                  Laurie Panda, MD   Echocardiographer:              Rudean Haskell, MD   Pre-operative Echo Findings: Severe aortic stenosis and mild AI Normal left ventricular systolic function   Post-operative Echo Findings: No paravalvular leak Unchanged/normal left ventricular systolic function _____________  EKG:  EKG is  ordered today.  The ekg ordered today demonstrates AF with HR 88bpm  Recent Labs: 07/23/2021: Magnesium 2.0 09/04/2021: BNP 323.1 09/25/2021: ALT 10 09/30/2021: BUN 16; Creatinine, Ser 0.91; Hemoglobin 11.7; Platelets 199; Potassium 3.5; Sodium 129  Recent Lipid Panel    Component Value Date/Time   CHOL 103 07/21/2021 0622   TRIG 38 07/21/2021 0622   HDL 35 (L) 07/21/2021 0622   CHOLHDL 2.9 07/21/2021 0622   VLDL 8 07/21/2021 0622   LDLCALC 60 07/21/2021 0622   Physical Exam:    VS:  BP 118/60   Pulse 73   Ht 6\' 1"  (1.854 m)   Wt 226 lb (102.5 kg)   SpO2 97%   BMI 29.82 kg/m     Wt Readings from Last 3 Encounters:  10/14/21 226 lb (102.5 kg)  09/30/21 228 lb 9.6 oz (103.7 kg)  09/25/21 229 lb 9.6 oz (104.1 kg)    General: Well developed, well nourished, NAD Neck: Negative for carotid bruits. No JVD Lungs:Clear to ausculation bilaterally. No wheezes, rales, or rhonchi. Breathing is unlabored. Cardiovascular: Irregularly irregular. No murmurs Extremities: No edema.  Neuro: Alert and oriented. No focal deficits. No facial asymmetry. MAE spontaneously. Psych: Responds to questions appropriately with normal affect.    ASSESSMENT/PLAN:    1. Severe AS s/p TAVR: Performed 09/29/21 with Oletta Lamas 60mm Sapien 3 Ultra THV without complications. Right and left groin sites stable. No high grade block EKG today. He  was restarted on home Xarelto prior to discharge. The need for SBE was discussed and will send in Rx for Amoxicillin 2g 1 hour prior to dental procedures.    2. Permanent Afib: Stable today on  EKG with no change needed. Continue a/c with Xarelto.    3. HTN: Stable, continue current regimen    4. HLD: Continue simvastatin.  Medication Adjustments/Labs and Tests Ordered: Current medicines are reviewed at length with the patient today.  Concerns regarding medicines are outlined above.  Orders Placed This Encounter  Procedures   EKG 12-Lead   Meds ordered this encounter  Medications   amoxicillin (AMOXIL) 500 MG tablet    Sig: Take 2,000 mg (4 tablets) 1 hour prior to dental procedures.    Dispense:  12 tablet    Refill:  6    Patient Instructions  Medication Instructions:  Your physician has recommended you make the following change in your medication:   Take Amoxicillin 2,000 mg (4 tablets) 1 hour prior to dental procedures.  *If you need a refill on your cardiac medications before your next appointment, please call your pharmacy*   Lab Work: NONE If you have labs (blood work) drawn today and your tests are completely normal, you will receive your results only by: [place] (if you have MyChart) OR A paper copy in the mail If you have any lab test that is abnormal or we need to change your treatment, we will call you to review the results.   Testing/Procedures: NONE   Follow-Up: At [facility], you and your health needs are our priority.  As part of our continuing mission to provide you with exceptional heart care, we have created designated Provider Care Teams.  These Care Teams include your primary Cardiologist (physician) and Advanced Practice Providers (APPs -  Physician Assistants and Nurse Practitioners) who all work together to provide you with the care you need, when you need it.  We recommend signing up for the patient portal called "MyChart".  Sign up information is provided on this After Visit Summary.  MyChart is used to connect with patients for Virtual Visits (Telemedicine).  Patients are able to view lab/test results, encounter notes,  upcoming appointments, etc.  Non-urgent messages can be sent to your provider as well.   To learn more about what you can do with MyChart, go to [email or web address].    Your next appointment:   KEEP FOLLOW UP AS PLANNED   Signed, Kathyrn Drown, NP  10/14/2021 3:04 PM    [facility]

## 2021-10-14 ENCOUNTER — Other Ambulatory Visit: Payer: Self-pay

## 2021-10-14 ENCOUNTER — Ambulatory Visit: Payer: Medicare Other | Admitting: Cardiology

## 2021-10-14 ENCOUNTER — Ambulatory Visit: Payer: Medicare Other | Admitting: Physician Assistant

## 2021-10-14 ENCOUNTER — Encounter: Payer: Self-pay | Admitting: Cardiology

## 2021-10-14 VITALS — BP 118/60 | HR 73 | Ht 73.0 in | Wt 226.0 lb

## 2021-10-14 DIAGNOSIS — I482 Chronic atrial fibrillation, unspecified: Secondary | ICD-10-CM

## 2021-10-14 DIAGNOSIS — Z952 Presence of prosthetic heart valve: Secondary | ICD-10-CM

## 2021-10-14 DIAGNOSIS — I350 Nonrheumatic aortic (valve) stenosis: Secondary | ICD-10-CM

## 2021-10-14 DIAGNOSIS — E782 Mixed hyperlipidemia: Secondary | ICD-10-CM | POA: Diagnosis not present

## 2021-10-14 DIAGNOSIS — E7841 Elevated Lipoprotein(a): Secondary | ICD-10-CM

## 2021-10-14 MED ORDER — AMOXICILLIN 500 MG PO TABS: ORAL_TABLET | ORAL | 6 refills | Status: AC

## 2021-10-14 NOTE — Patient Instructions (Signed)
Medication Instructions:  Your physician has recommended you make the following change in your medication:   Take Amoxicillin 2,000 mg (4 tablets) 1 hour prior to dental procedures.  *If you need a refill on your cardiac medications before your next appointment, please call your pharmacy*   Lab Work: NONE If you have labs (blood work) drawn today and your tests are completely normal, you will receive your results only by: [place] (if you have MyChart) OR A paper copy in the mail If you have any lab test that is abnormal or we need to change your treatment, we will call you to review the results.   Testing/Procedures: NONE   Follow-Up: At [facility], you and your health needs are our priority.  As part of our continuing mission to provide you with exceptional heart care, we have created designated Provider Care Teams.  These Care Teams include your primary Cardiologist (physician) and Advanced Practice Providers (APPs -  Physician Assistants and Nurse Practitioners) who all work together to provide you with the care you need, when you need it.  We recommend signing up for the patient portal called "MyChart".  Sign up information is provided on this After Visit Summary.  MyChart is used to connect with patients for Virtual Visits (Telemedicine).  Patients are able to view lab/test results, encounter notes, upcoming appointments, etc.  Non-urgent messages can be sent to your provider as well.   To learn more about what you can do with MyChart, go to [email or web address].    Your next appointment:   KEEP FOLLOW UP AS PLANNED

## 2021-10-29 ENCOUNTER — Ambulatory Visit: Payer: Medicare Other | Admitting: Physician Assistant

## 2021-10-29 ENCOUNTER — Encounter: Payer: Self-pay | Admitting: Physician Assistant

## 2021-10-29 ENCOUNTER — Other Ambulatory Visit: Payer: Self-pay

## 2021-10-29 ENCOUNTER — Ambulatory Visit (HOSPITAL_COMMUNITY): Payer: Medicare Other | Attending: Cardiology

## 2021-10-29 VITALS — BP 106/60 | HR 67 | Ht 73.0 in | Wt 226.0 lb

## 2021-10-29 DIAGNOSIS — I482 Chronic atrial fibrillation, unspecified: Secondary | ICD-10-CM | POA: Diagnosis not present

## 2021-10-29 DIAGNOSIS — E782 Mixed hyperlipidemia: Secondary | ICD-10-CM

## 2021-10-29 DIAGNOSIS — Z952 Presence of prosthetic heart valve: Secondary | ICD-10-CM | POA: Insufficient documentation

## 2021-10-29 DIAGNOSIS — I10 Essential (primary) hypertension: Secondary | ICD-10-CM | POA: Diagnosis not present

## 2021-10-29 LAB — ECHOCARDIOGRAM COMPLETE
AV Mean grad: 7.6 mmHg
AV Peak grad: 14 mmHg
Ao pk vel: 1.87 m/s
S' Lateral: 3.2 cm

## 2021-10-29 NOTE — Progress Notes (Addendum)
William Sawyer                                     Cardiology Office Note:    Date:  10/29/2021   ID:  William Sawyer, DOB 16-Oct-1933, MRN [id number]  PCP:  [name], MD  [facility] Cardiologist: Dr. Sherren Mocha, MD Referring MD: [name], MD   1 month s/p TAVR  History of Present Illness:    William Sawyer is a 87 y.o. male with a hx of HTN, HLD, DM2, chronic afib on Xarelto, chronic diastolic CHF, orthopedic mobility issues (uses motorized scooter) but still functionally independent, and severe AS s/p TAVR (09/29/21) who presents to clinic for follow up.    Mr. William Sawyer was previously hospitalized in 07/2021 with shortness of breath and was found to have signs and symptoms of acute diastolic heart failure. He was treated with IV Lasix with significant improvement in his symptoms. An echocardiogram performed during his hospitalization was suggestive of significant aortic stenosis. Echo noted to have very poor acoustic windows and difficult image quality. However, the demonstrated a mean gradient of 38 mmHg with a dimensionless index of 0.26, suggestive of at least moderately severe aortic stenosis. He was referred to the structural heart team further evaluation of his aortic stenosis and was seen by Dr. Burt Knack 08/06/21. Cath showed nonobstructive coronary artery disease with moderate mid RCA stenosis, wide patency of the left main, patency of the LAD with mild diffuse plaquing, and patency of the large left circumflex with mild diffuse plaquing. Severe calcific aortic stenosis with peak to peak transvalvular gradient 64 mmHg, mean gradient 55 mmHg, aortic valve area 0.93 cm, aortic valve area index of 0.41 Essentially normal right heart pressures.    He ultimately underwent successful TAVR 09/29/21 with Edwards 35mm Sapien 3 Ultra THV without complications. POD 1 echo  showed EF 55%, normally functioning TAVR with a mean gradient of  8 mmHg and no PVL. He was resumed on home Xarelto. He has had a marked clinical improvement since TAVR.   Today he is here alone. No CP or SOB. No LE edema, orthopnea or PND. No dizziness or syncope. No blood in stool or urine. No palpitations. He just feels so much better since TAVR: like a new man. He didn't realize how badly he felt before. He is so grateful.   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 Cooper/Bartle   Past Surgical History:  Procedure Laterality Date   CATARACT EXTRACTION Bilateral    KNEE SURGERY Right    x5   Left hip replacement     REPLACEMENT TOTAL KNEE BILATERAL Bilateral    RIGHT/LEFT HEART CATH AND CORONARY ANGIOGRAPHY N/A 08/28/2021   Procedure: RIGHT/LEFT HEART CATH AND CORONARY ANGIOGRAPHY;  Surgeon: Sherren Mocha, MD;  Location: [place] [facility];  Service: Cardiovascular;  Laterality: N/A;   TEE WITHOUT CARDIOVERSION N/A 09/29/2021   Procedure: TRANSESOPHAGEAL ECHOCARDIOGRAM (TEE);  Surgeon: Sherren Mocha, MD;  Location: [place];  Service: Open Heart  Surgery;  Laterality: N/A;   TRANSCATHETER AORTIC VALVE REPLACEMENT, TRANSFEMORAL N/A 09/29/2021   Procedure: TRANSCATHETER AORTIC VALVE REPLACEMENT, TRANSFEMORAL;  Surgeon: Sherren Mocha, MD;  Location: [place];  Service: Open Heart Surgery;  Laterality: N/A;    Current Medications: Current Meds  Medication Sig   allopurinol (ZYLOPRIM) 300 MG tablet Take 300 mg by mouth daily.   amoxicillin (AMOXIL) 500 MG tablet Take 2,000 mg (4 tablets) 1 hour prior to dental procedures.   Ferrous Sulfate (IRON PO) Take 1 tablet by mouth every evening.   furosemide (LASIX) 40 MG tablet Take 1  tablet (40 mg total) by mouth daily.   LANTUS SOLOSTAR 100 UNIT/ML Solostar Pen Inject 0-10 Units into the skin at bedtime as needed (blood sugar over 150).   metFORMIN (GLUCOPHAGE) 1000 MG tablet Take 500-1,000 mg by mouth See admin instructions. Take 1000 mg by mouth in the morning and 500 mg at night.   metoprolol succinate (TOPROL-XL) 100 MG 24 hr tablet Take 1 tablet (100 mg total) by mouth daily. Take with or immediately following a meal.   simvastatin (ZOCOR) 20 MG tablet Take 20 mg by mouth at bedtime.   XARELTO 20 MG TABS tablet Take 20 mg by mouth daily.     Allergies:   Pregabalin, Amlodipine besylate, and Diltiazem hcl   Social History   Socioeconomic History   Marital status: Married    Spouse name: Not on file   Number of children: Not on file   Years of education: Not on file   Highest education level: Not on file  Occupational History   Not on file  Tobacco Use   Smoking status: Never   Smokeless tobacco: Never  Vaping Use   Vaping Use: Never used  Substance and Sexual Activity   Alcohol use: Not Currently   Drug use: Never   Sexual activity: Not on file  Other Topics Concern   Not on file  Social History Narrative   Not on file   Social Determinants of Health   Financial Resource Strain: Not on file  Food Insecurity: Not on file  Transportation Needs: Not on file  Physical Activity: Not on file  Stress: Not on file  Social Connections: Not on file     Family History: The patient's family history includes Breast cancer in his mother; Pancreatic cancer in his father.  ROS:   Please see the history of present illness.    All other systems reviewed and are negative.  EKGs/Labs/Other Studies Reviewed:    The following studies were reviewed today:   TAVR OPERATIVE NOTE     Date of Procedure:                09/29/2021   Preoperative Diagnosis:      Severe Aortic Stenosis    Postoperative Diagnosis:    Same    Procedure:        Transcatheter  Aortic Valve Replacement - Percutaneous  Transfemoral Approach             Edwards Sapien 3 Ultra THV (size 29 mm, model # 9750TFX, serial # [id number])              Co-Surgeons:                        Gaye Pollack, MD, Lenna Sciara, MD, and Sherren Mocha, MD   Anesthesiologist:  Laurie Panda, MD   Echocardiographer:              Rudean Haskell, MD   Pre-operative Echo Findings: Severe aortic stenosis and mild AI Normal left ventricular systolic function   Post-operative Echo Findings: No paravalvular leak Unchanged/normal left ventricular systolic function _____________  10/01/21 Echocardiogram 09/30/21:  1. The aortic valve has been replaced by a 29 mm Edwards Sapien Valve.  Aortic valve regurgitation is not visualized. Aortic valve mean gradient  measures 8.0 mmHg.   2. Post Procedure peak gradient 13 mm Hg, mean gradient 8 mm Hg, DVI 0.4,  EOA 1.6 cm2.   3. Left ventricular ejection fraction, by estimation, is 55 to 60%. The  left ventricle has normal function. Left ventricular endocardial border  not optimally defined to evaluate regional wall motion. There is mild  concentric left ventricular  hypertrophy. Left ventricular diastolic function could not be evaluated.   4. Right ventricular systolic function is low normal. The right  ventricular size is normal. Tricuspid regurgitation signal is inadequate  for assessing PA pressure.   5. Left atrial size was mildly dilated.   6. The mitral valve was not well visualized. No evidence of mitral valve  regurgitation. Moderate mitral annular calcification.   7. The inferior vena cava is dilated in size with >50% respiratory  variability, suggesting right atrial pressure of 8 mmHg.    _______________________  Echo 10/29/21 IMPRESSIONS  1. Left ventricular ejection fraction, by estimation, is 60 to 65%. The left ventricle has normal function. The left ventricle has no regional wall motion abnormalities.  There is mild concentric left ventricular hypertrophy. Left ventricular diastolic  function could not be evaluated.  2. Right ventricular systolic function is normal. The right ventricular size is normal.  3. Left atrial size was mildly dilated.  4. Right atrial size was mildly dilated.  5. The mitral valve is normal in structure. Mild mitral valve regurgitation. No evidence of mitral stenosis. Moderate mitral annular calcification.  6. The aortic valve has been repaired/replaced. There is a 29 mm Sapien prosthetic, stented (TAVR) valve present in the aortic position.     Trivial perivalvular Aortic valve regurgitation is present. Aortic valve mean gradient measures 7.6 mmHg. Aortic valve Vmax measures 1.87 m/s. DI 0.51.  7. Aortic dilatation noted. There is borderline dilatation of the aortic root, measuring 37 mm.  8. A small pericardial effusion is present. The pericardial effusion is localized near the right atrium and localized near the right ventricle. There is no evidence of cardiac tamponade.  9. Compared to study dated 09/30/2021, the TAVR is stable. The peak/mean transaortic gradients are essentially unchanged and there it trivial perivalvular AI of the TAVR.    EKG:  EKG is  NOT ordered today.   Recent Labs: 07/23/2021: Magnesium 2.0 09/04/2021: BNP 323.1 09/25/2021: ALT 10 09/30/2021: BUN 16; Creatinine, Ser 0.91; Hemoglobin 11.7; Platelets 199; Potassium 3.5; Sodium 129  Recent Lipid Panel    Component Value Date/Time   CHOL 103 07/21/2021 0622   TRIG 38 07/21/2021 0622   HDL 35 (L) 07/21/2021 0622   CHOLHDL 2.9 07/21/2021 0622   VLDL 8 07/21/2021 0622   LDLCALC 60 07/21/2021 0622   Physical Exam:    VS:  BP 106/60   Pulse 67   Ht 6\' 1"  (1.854 m)   Wt 226 lb (102.5 kg)   SpO2 97%   BMI 29.82 kg/m     Wt Readings from Last 3 Encounters:  10/29/21 226 lb (  102.5 kg)  10/14/21 226 lb (102.5 kg)  09/30/21 228 lb 9.6 oz (103.7 kg)    General: Well developed, well  nourished, NAD Neck: Negative for carotid bruits. No JVD Lungs:Clear to ausculation bilaterally. No wheezes, rales, or rhonchi. Breathing is unlabored. Cardiovascular: Irregularly irregular. No murmurs Extremities: No edema.  Neuro: Alert and oriented. No focal deficits. No facial asymmetry. MAE spontaneously. Psych: Responds to questions appropriately with normal affect.    ASSESSMENT/PLAN:    Severe AS s/p TAVR: echo today shows EF 60%, normally functioning TAVR with a mean gradient of 7.6 mm hg and trivial PVL. A small pericardial effusion is present. The pericardial effusion is localized near the right atrium and localized near the right ventricle. There is no evidence of cardiac tamponade. He has NYHA class I symptoms and "feels like a new man." The need for SBE was discussed; he has Amoxicillin. Continue on Xarelto. I will see him back in 6 months for follow up.    Permanent Afib: rate well controlled. Continue Xarelto   HTN: Bp well controlled. No changes made   HLD: Continue simvastatin.   Medication Adjustments/Labs and Tests Ordered: Current medicines are reviewed at length with the patient today.  Concerns regarding medicines are outlined above.  No orders of the defined types were placed in this encounter.  No orders of the defined types were placed in this encounter.   Patient Instructions  Medication Instructions:  Your physician recommends that you continue on your current medications as directed. Please refer to the Current Medication list given to you today.  *If you need a refill on your cardiac medications before your next appointment, please call your pharmacy*   Lab Work: None ordered  If you have labs (blood work) drawn today and your tests are completely normal, you will receive your results only by: [place] (if you have MyChart) OR A paper copy in the mail If you have any lab test that is abnormal or we need to change your treatment, we will call  you to review the results.   Testing/Procedures: None ordered    Follow-Up: Follow up as scheduled     Other Instructions None     Signed, Angelena Form, PA-C  10/29/2021 7:06 PM    [facility]

## 2021-10-29 NOTE — Patient Instructions (Signed)

## 2021-10-30 ENCOUNTER — Other Ambulatory Visit (HOSPITAL_COMMUNITY): Payer: Medicare Other

## 2021-12-18 NOTE — Progress Notes (Deleted)
Cardiology Office Note:    Date:  12/18/2021   ID:  William Sawyer, DOB July 17, 1933, MRN [id number]  PCP:  [name], MD  [facility] Cardiologist:  None  [facility] Electrophysiologist:  None   Referring MD: [name], MD   Chief Complaint: 3 month follow-up  History of Present Illness:    William Sawyer is a 87 y.o. 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 Cooper/Bartle    Past Surgical History:  Procedure Laterality Date   CATARACT EXTRACTION Bilateral    KNEE SURGERY Right    x5   Left hip replacement     REPLACEMENT TOTAL KNEE BILATERAL Bilateral    RIGHT/LEFT HEART CATH AND CORONARY ANGIOGRAPHY N/A 08/28/2021   Procedure: RIGHT/LEFT HEART CATH AND CORONARY ANGIOGRAPHY;  Surgeon: Sherren Mocha, MD;  Location: [place] [facility];  Service: Cardiovascular;  Laterality: N/A;   TEE WITHOUT CARDIOVERSION N/A 09/29/2021   Procedure: TRANSESOPHAGEAL ECHOCARDIOGRAM (TEE);  Surgeon: Sherren Mocha, MD;  Location: [place];  Service: Open Heart Surgery;  Laterality: N/A;   TRANSCATHETER AORTIC VALVE REPLACEMENT, TRANSFEMORAL N/A 09/29/2021   Procedure: TRANSCATHETER AORTIC VALVE REPLACEMENT, TRANSFEMORAL;  Surgeon: Sherren Mocha, MD;  Location: [place];  Service: Open Heart Surgery;  Laterality: N/A;    Current Medications: No outpatient medications have been marked as taking for the 12/24/21 encounter (Appointment) with Kathlen Mody, Marda Breidenbach H, PA-C.     Allergies:   Pregabalin, Amlodipine besylate, and Diltiazem hcl   Social History   Socioeconomic History   Marital status: Married    Spouse name: Not on file    Number of children: Not on file   Years of education: Not on file   Highest education level: Not on file  Occupational History   Not on file  Tobacco Use   Smoking status: Never   Smokeless tobacco: Never  Vaping Use   Vaping Use: Never used  Substance and Sexual Activity   Alcohol use: Not Currently   Drug use: Never   Sexual activity: Not on file  Other Topics Concern   Not on file  Social History Narrative   Not on file   Social Determinants of Health   Financial Resource Strain: Not on file  Food Insecurity: Not on file  Transportation Needs: Not on file  Physical Activity: Not on file  Stress: Not on file  Social Connections: Not on file     Family History: The patient's ***family history includes Breast cancer in his mother; Pancreatic cancer in his father.  ROS:   Please see the history of present illness.    *** All other systems reviewed and are negative.  EKGs/Labs/Other Studies Reviewed:    The following studies were reviewed today: ***  EKG:  EKG is *** ordered today.  The ekg ordered today demonstrates ***  Recent Labs: 07/23/2021: Magnesium 2.0 09/04/2021: BNP 323.1 09/25/2021: ALT 10 09/30/2021: BUN 16; Creatinine, Ser 0.91; Hemoglobin 11.7; Platelets 199; Potassium 3.5; Sodium 129  Recent Lipid Panel    Component Value Date/Time   CHOL 103 07/21/2021 0622   TRIG 38 07/21/2021  0622   HDL 35 (L) 07/21/2021 0622   CHOLHDL 2.9 07/21/2021 0622   VLDL 8 07/21/2021 0622   LDLCALC 60 07/21/2021 0622     Risk Assessment/Calculations:   {Does this patient have ATRIAL FIBRILLATION?:[id number]}   Physical Exam:    VS:  There were no vitals taken for this visit.    Wt Readings from Last 3 Encounters:  10/29/21 226 lb (102.5 kg)  10/14/21 226 lb (102.5 kg)  09/30/21 228 lb 9.6 oz (103.7 kg)     GEN: *** Well nourished, well developed in no acute distress HEENT: Normal NECK: No JVD; No carotid bruits LYMPHATICS: No lymphadenopathy CARDIAC:  ***RRR, no murmurs, rubs, gallops RESPIRATORY:  Clear to auscultation without rales, wheezing or rhonchi  ABDOMEN: Soft, non-tender, non-distended MUSCULOSKELETAL:  No edema; No deformity  SKIN: Warm and dry NEUROLOGIC:  Alert and oriented x 3 PSYCHIATRIC:  Normal affect   ASSESSMENT:    No diagnosis found. PLAN:    In order of problems listed above:  ***  Disposition: Follow up {follow up:15908} with ***   Shared Decision Making/Informed Consent   {Are you ordering a CV Procedure (e.g. stress test, cath, DCCV, TEE, etc)?   Press F2        :[id number]}    Signed, Regie Bunner Ninfa Meeker, PA-C  12/18/2021 1:06 PM    [facility]

## 2021-12-24 ENCOUNTER — Encounter: Payer: Self-pay | Admitting: Nurse Practitioner

## 2021-12-24 ENCOUNTER — Other Ambulatory Visit: Payer: Self-pay

## 2021-12-24 ENCOUNTER — Ambulatory Visit: Payer: Medicare Other | Admitting: Nurse Practitioner

## 2021-12-24 VITALS — BP 136/70 | HR 86 | Ht 74.0 in | Wt 232.0 lb

## 2021-12-24 DIAGNOSIS — Z952 Presence of prosthetic heart valve: Secondary | ICD-10-CM

## 2021-12-24 DIAGNOSIS — I482 Chronic atrial fibrillation, unspecified: Secondary | ICD-10-CM

## 2021-12-24 DIAGNOSIS — I5032 Chronic diastolic (congestive) heart failure: Secondary | ICD-10-CM

## 2021-12-24 DIAGNOSIS — E782 Mixed hyperlipidemia: Secondary | ICD-10-CM

## 2021-12-24 DIAGNOSIS — I10 Essential (primary) hypertension: Secondary | ICD-10-CM

## 2021-12-24 DIAGNOSIS — I350 Nonrheumatic aortic (valve) stenosis: Secondary | ICD-10-CM | POA: Diagnosis not present

## 2021-12-24 NOTE — Progress Notes (Signed)
Office Visit    Patient Name: William Sawyer Date of Encounter: 12/24/2021  Primary Care Provider:  [name], MD Primary Cardiologist:  Nelva Bush, MD  Chief Complaint   88 year old male with a history of hypertension, hyperlipidemia, chronic atrial fibrillation on Xarelto, chronic diastolic heart failure, severe AS s/p TAVR (09/2021), and orthopedic mobility issues (uses motorized scooter), functionally independent who presents for follow-up related to atrial fibrillation, heart failure and aortic stenosis.   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 Cooper/Bartle   Past Surgical History:  Procedure Laterality Date   CATARACT EXTRACTION Bilateral    KNEE SURGERY Right    x5   Left hip replacement     REPLACEMENT TOTAL KNEE BILATERAL Bilateral    RIGHT/LEFT HEART CATH AND CORONARY ANGIOGRAPHY N/A 08/28/2021   Procedure: RIGHT/LEFT HEART CATH AND CORONARY ANGIOGRAPHY;  Surgeon: Sherren Mocha, MD;  Location: [place] [facility];  Service: Cardiovascular;  Laterality: N/A;   TEE WITHOUT CARDIOVERSION N/A 09/29/2021   Procedure: TRANSESOPHAGEAL ECHOCARDIOGRAM (TEE);  Surgeon: Sherren Mocha, MD;  Location: [place];  Service: Open Heart Surgery;  Laterality: N/A;   TRANSCATHETER AORTIC VALVE REPLACEMENT, TRANSFEMORAL N/A 09/29/2021   Procedure: TRANSCATHETER AORTIC VALVE REPLACEMENT, TRANSFEMORAL;  Surgeon: Sherren Mocha, MD;  Location: [place];  Service: Open Heart Surgery;  Laterality: N/A;    Allergies  Allergies  Allergen Reactions   Pregabalin Other (See Comments)    Pt unaware of this allergy   Amlodipine Besylate      Pt unaware of this allergy   Diltiazem Hcl     Pt unaware of this allergy    History of Present Illness    88 year old male with the above past medical history including hypertension, hyperlipidemia, chronic atrial fibrillation on Xarelto, chronic diastolic heart failure, severe AS s/p TAVR (09/2021), and orthopedic mobility issues (uses motorized scooter), functionally independent.   He was hospitalized in 07/2021 with shortness of breath and was found to have acute diastolic heart failure in the setting of significant aortic stenosis. Echo showed EF >55%, severe AS, mean gradient of 38 mmHg. He was referred to the structural heart team for consideration of TAVR. Cath 08/2021 showed nonobstructive CAD with 40% mRCA stenosis, mild diffuse plaquing to LAD, and LCx, and severe calcific aortic stenosis. He underwent successful TAVR on 09/29/21. Repeat echo 10/2021 showed EF 60%, normally functioning TAVR with a mean gradient of 7.6 mmHg, small pericardial effusion, mild BAE, and mild MR.   He was last seen in the office by the structural heart team on 10/29/2021 and was doing well from a cardiac standpoint. He presents today for routine follow-up. Since his last visit continues to do well.  His blood pressure is slightly elevated today, 136/70.  He states he checks his blood pressure at home and reports readings less than 130/80 in the mornings though sometimes in the afternoons he does have higher readings in the 140s/80s.  Otherwise, he has no concerns or complaints today and feels his quality of life has greatly improved since his TAVR.  Home Medications    Current Outpatient  Medications  Medication Sig Dispense Refill   allopurinol (ZYLOPRIM) 300 MG tablet Take 300 mg by mouth daily.     amoxicillin (AMOXIL) 500 MG tablet Take 2,000 mg (4 tablets) 1 hour prior to dental procedures. 12 tablet 6   Ferrous Sulfate (IRON PO) Take 1 tablet by mouth every evening.     furosemide (LASIX) 40 MG tablet  Take 1 tablet (40 mg total) by mouth daily. 30 tablet    LANTUS SOLOSTAR 100 UNIT/ML Solostar Pen Inject 0-10 Units into the skin at bedtime as needed (blood sugar over 150).     metFORMIN (GLUCOPHAGE) 1000 MG tablet Take 500-1,000 mg by mouth See admin instructions. Take 1000 mg by mouth in the morning and 500 mg at night.     metoprolol succinate (TOPROL-XL) 100 MG 24 hr tablet Take 1 tablet (100 mg total) by mouth daily. Take with or immediately following a meal. 90 tablet 3   simvastatin (ZOCOR) 20 MG tablet Take 20 mg by mouth at bedtime.     XARELTO 20 MG TABS tablet Take 20 mg by mouth daily.     No current facility-administered medications for this visit.     Review of Systems    He denies chest pain, palpitations, dyspnea, pnd, orthopnea, n, v, dizziness, syncope, edema, weight gain, or early satiety. All other systems reviewed and are otherwise negative except as noted above.   Physical Exam    VS:  BP 136/70 (BP Location: Left Arm, Patient Position: Sitting, Cuff Size: Normal)    Pulse 86    Ht 6\' 2"  (1.88 m)    Wt 232 lb (105.2 kg)    SpO2 97%    BMI 29.79 kg/m   GEN: Well nourished, well developed, in no acute distress. HEENT: normal. Neck: Supple, no JVD, carotid bruits, or masses. Cardiac: irregularly irregular, no murmurs, rubs, or gallops. No clubbing, cyanosis. Chronic non-pitting BLE edema.  Radials/DP/PT 2+ and equal bilaterally.  Respiratory:  Respirations regular and unlabored, clear to auscultation bilaterally. GI: Soft, nontender, nondistended, BS + x 4. MS: no deformity or atrophy. Skin: warm and dry, no rash. Neuro:  Strength and sensation are intact. Psych: Normal affect.  Accessory Clinical Findings    ECG personally reviewed by me today - Atrial fibrillation, 86 bpm, low voltage QRS - no acute changes.  Lab Results  Component Value Date   WBC 14.1 (H) 09/30/2021   HGB 11.7 (L) 09/30/2021   HCT 34.5 (L) 09/30/2021   MCV 93.8 09/30/2021   PLT 199  09/30/2021   Lab Results  Component Value Date   CREATININE 0.91 09/30/2021   BUN 16 09/30/2021   NA 129 (L) 09/30/2021   K 3.5 09/30/2021   CL 93 (L) 09/30/2021   CO2 28 09/30/2021   Lab Results  Component Value Date   ALT 10 09/25/2021   AST 19 09/25/2021   ALKPHOS 61 09/25/2021   BILITOT 0.8 09/25/2021   Lab Results  Component Value Date   CHOL 103 07/21/2021   HDL 35 (L) 07/21/2021   LDLCALC 60 07/21/2021   TRIG 38 07/21/2021   CHOLHDL 2.9 07/21/2021    Lab Results  Component Value Date   HGBA1C 6.2 (H) 09/25/2021    Assessment & Plan    1. Permanent Atrial fibrillation: Rate controlled on Troprol-XL. Continue Xarelto.   2. Severe AS s/p TAVR: Echo 10/2021 showed EF 60%, normally functioning TAVR with a mean gradient of 7.6 mmHg, small pericardial effusion, mild BAE, and  mild MR.He reports feeling very well following his TAVR procedure. His quality of life is much improved and he is very grateful to the structural heart team. He denies any shortness of breath.  We discussed the use of prophylactic antibiotics before dental work and other surgeries. He has a prescription for Amoxicillin.  Continue Xarelto, Lasix, Toprol-XL.  3. Chronic diastolic heart failure: Recent echo as above. Euvolemic and well compensated on exam. He does have some stable, chronic, mild bilateral lower extremity edema for which he wears compression stockings.  His weights have been stable at home. Continue lasix, Toprol-XL.     4. Hypertension: BP above goal in office today.  He will continue to monitor his blood pressure at home and report BP distantly greater than 130/80.  If blood pressure remains elevated, could consider restarting lisinopril or adding amlodipine 5 mg daily.  For now,  continue current antihypertensive regimen.   5. Hyperlipidemia: LDL 60 on 07/2021. Continue simvastatin.   6. Disposition: Follow-up in 6 months.  Lenna Sciara, NP 12/24/2021, 3:18 PM

## 2021-12-24 NOTE — Patient Instructions (Signed)
Medication Instructions:   Your physician recommends that you continue on your current medications as directed. Please refer to the Current Medication list given to you today.  *If you need a refill on your cardiac medications before your next appointment, please call your pharmacy*   Lab Work:  None Ordered  If you have labs (blood work) drawn today and your tests are completely normal, you will receive your results only by: [place] (if you have MyChart) OR A paper copy in the mail If you have any lab test that is abnormal or we need to change your treatment, we will call you to review the results.   Testing/Procedures:  None Ordered   Follow-Up: At [facility], you and your health needs are our priority.  As part of our continuing mission to provide you with exceptional heart care, we have created designated Provider Care Teams.  These Care Teams include your primary Cardiologist (physician) and Advanced Practice Providers (APPs -  Physician Assistants and Nurse Practitioners) who all work together to provide you with the care you need, when you need it.  We recommend signing up for the patient portal called "MyChart".  Sign up information is provided on this After Visit Summary.  MyChart is used to connect with patients for Virtual Visits (Telemedicine).  Patients are able to view lab/test results, encounter notes, upcoming appointments, etc.  Non-urgent messages can be sent to your provider as well.   To learn more about what you can do with MyChart, go to [email or web address].    Your next appointment:   6 month(s)  The format for your next appointment:   In Person  Provider:   You may see Nelva Bush, MD or one of the following Advanced Practice Providers on your designated Care Team:   Murray Hodgkins, NP Christell Faith, PA-C Cadence Kathlen Mody, [place]   Other Instructions Please keep track of your blood pressure.  If your blood pressure is consistently  130/80 please give [place] a call.

## 2022-01-27 ENCOUNTER — Telehealth: Payer: Self-pay | Admitting: Internal Medicine

## 2022-01-27 NOTE — Telephone Encounter (Signed)
I was contacted by Dr. Hall Busing regarding preoperative recommendations for William Sawyer, who is scheduled to undergo extraction of multiple teeth in the near future.  He is status post TAVR by Dr. Burt Knack in 09/2021.  He also has a history of permanent atrial fibrillation and is on rivaroxaban.  It is reasonable to discontinue rivaroxaban 2-3 days before planned dental extractions and to restart it as soon as it is safe to do so from a dental standpoint.  SBE prophylaxis is recommended with amoxicillin 2 gm 30-60 minutes before dental procedure.  I have discussed these recommendations with Dr. Hall Busing.  Nelva Bush, MD [facility]

## 2022-04-22 ENCOUNTER — Telehealth: Payer: Self-pay | Admitting: Physician Assistant

## 2022-04-22 NOTE — Telephone Encounter (Signed)
Called and left message with pt to try to r/s ?

## 2022-04-22 NOTE — Telephone Encounter (Signed)
Pt is requesting to schedule his structural heart appt to next month.   ?

## 2022-04-23 NOTE — Telephone Encounter (Signed)
I spoke with the pt and rescheduled his appointment to 6/7.  ?

## 2022-04-28 ENCOUNTER — Ambulatory Visit: Payer: Medicare Other

## 2022-04-29 ENCOUNTER — Ambulatory Visit: Payer: Medicare Other | Admitting: Physician Assistant

## 2022-05-03 ENCOUNTER — Other Ambulatory Visit: Payer: Self-pay | Admitting: Physician Assistant

## 2022-05-03 DIAGNOSIS — Z952 Presence of prosthetic heart valve: Secondary | ICD-10-CM

## 2022-05-25 NOTE — Progress Notes (Unsigned)
HE[facility] AND [place]                                     Cardiology Office Note:    Date:  05/26/2022   ID:  William Sawyer, DOB 11-07-1933, MRN [id number]  PCP:  [name], MD  [facility] Cardiologist: Dr. Sherren Mocha, MD Referring MD: [name], MD   Follow up.   History of Present Illness:    William Sawyer is a 88 y.o. male with a hx of HTN, HLD, DM2, chronic afib on Xarelto, chronic diastolic CHF, orthopedic mobility issues (uses motorized scooter) but still functionally independent, and severe AS s/p TAVR (09/29/21) who presents to clinic for follow up.    William Sawyer was previously hospitalized in 07/2021 with shortness of breath and was found to have acute diastolic heart failure. He was treated with IV Lasix with significant improvement in his symptoms. An echocardiogram performed during his hospitalization was suggestive of significant aortic stenosis. Echo noted to have very poor acoustic windows and difficult image quality. However, the demonstrated a mean gradient of 38 mmHg with a dimensionless index of 0.26, suggestive of at least moderately severe aortic stenosis. He was referred to the structural heart team further evaluation of his aortic stenosis and was seen by Dr. Burt Knack 08/06/21. Cath showed nonobstructive coronary artery disease with moderate mid RCA stenosis, wide patency of the left main, patency of the LAD with mild diffuse plaquing, and patency of the large left circumflex with mild diffuse plaquing. Severe calcific aortic stenosis with peak to peak transvalvular gradient 64 mmHg, mean gradient 55 mmHg, aortic valve area 0.93 cm, aortic valve area index of 0.41 Essentially normal right heart pressures.    He ultimately underwent successful TAVR 09/29/21 with Edwards 81m Sapien 3 Ultra THV without complications. POD 1 echo  showed EF 55%, normally functioning TAVR with a mean gradient of 8 mmHg and no PVL. He was  resumed on home Xarelto. He had a marked clinical improvement since TAVR. 1 month echo showed EF 60%, normally functioning TAVR with a mean gradient of 7.6 mm hg and trivial PVL as well a small pericardial effusion  localized near the right atrium and right ventricle w/ no evidence of cardiac tamponade.   Today the patient presents to clinic for follow up. He is here alone. No CP or SOB. Has chronic LE edema that is unchanged and stable with compression stockings. Swelling improved markedly after TAVR. No orthopnea or PND. No dizziness or syncope. No blood in stool or urine. No palpitations. He is just so happy to be here and feels great.     Past Medical History:  Diagnosis Date   Atrial fibrillation, chronic (HCC)    Chronic diastolic (congestive) heart failure (HCC)    Diabetes mellitus without complication (HCC)    Gout    History of kidney stones    HLD (hyperlipidemia)    HTN (hypertension)    S/P TAVR (transcatheter aortic valve replacement) 09/29/2021   Edwards 231mS3 via TF approach with Drs. Cooper/Bartle   Past Surgical History:  Procedure Laterality Date   CATARACT EXTRACTION Bilateral    KNEE SURGERY Right    x5   Left hip replacement     REPLACEMENT TOTAL KNEE BILATERAL Bilateral    RIGHT/LEFT HEART CATH AND CORONARY ANGIOGRAPHY N/A 08/28/2021   Procedure: RIGHT/LEFT HEART  CATH AND CORONARY ANGIOGRAPHY;  Surgeon: Sherren Mocha, MD;  Location: [place] [facility];  Service: Cardiovascular;  Laterality: N/A;   TEE WITHOUT CARDIOVERSION N/A 09/29/2021   Procedure: TRANSESOPHAGEAL ECHOCARDIOGRAM (TEE);  Surgeon: Sherren Mocha, MD;  Location: [place];  Service: Open Heart Surgery;  Laterality: N/A;   TRANSCATHETER AORTIC VALVE REPLACEMENT, TRANSFEMORAL N/A 09/29/2021   Procedure: TRANSCATHETER AORTIC VALVE REPLACEMENT, TRANSFEMORAL;  Surgeon: Sherren Mocha, MD;  Location: [place];  Service: Open Heart Surgery;  Laterality: N/A;    Current Medications: Current Meds   Medication Sig   allopurinol (ZYLOPRIM) 300 MG tablet Take 300 mg by mouth daily.   amoxicillin (AMOXIL) 500 MG tablet Take 2,000 mg (4 tablets) 1 hour prior to dental procedures.   Ferrous Sulfate (IRON PO) Take 1 tablet by mouth every evening.   LANTUS SOLOSTAR 100 UNIT/ML Solostar Pen Inject 0-10 Units into the skin at bedtime as needed (blood sugar over 150).   metFORMIN (GLUCOPHAGE) 1000 MG tablet Take 1,000 mg by mouth in the morning and at bedtime.   simvastatin (ZOCOR) 20 MG tablet Take 20 mg by mouth at bedtime.   XARELTO 20 MG TABS tablet Take 20 mg by mouth daily.     Allergies:   Pregabalin, Amlodipine besylate, and Diltiazem hcl   Social History   Socioeconomic History   Marital status: Married    Spouse name: Not on file   Number of children: Not on file   Years of education: Not on file   Highest education level: Not on file  Occupational History   Not on file  Tobacco Use   Smoking status: Never   Smokeless tobacco: Never  Vaping Use   Vaping Use: Never used  Substance and Sexual Activity   Alcohol use: Not Currently   Drug use: Never   Sexual activity: Not on file  Other Topics Concern   Not on file  Social History Narrative   Not on file   Social Determinants of Health   Financial Resource Strain: Not on file  Food Insecurity: Not on file  Transportation Needs: Not on file  Physical Activity: Not on file  Stress: Not on file  Social Connections: Not on file     Family History: The patient's family history includes Breast cancer in his mother; Pancreatic cancer in his father.  ROS:   Please see the history of present illness.    All other systems reviewed and are negative.  EKGs/Labs/Other Studies Reviewed:    The following studies were reviewed today:   TAVR OPERATIVE NOTE     Date of Procedure:                09/29/2021   Preoperative Diagnosis:      Severe Aortic Stenosis    Postoperative Diagnosis:    Same    Procedure:         Transcatheter Aortic Valve Replacement - Percutaneous  Transfemoral Approach             Edwards Sapien 3 Ultra THV (size 29 mm, model # 9750TFX, serial # [id number])              Co-Surgeons:                        Gaye Pollack, MD, Lenna Sciara, MD, and Sherren Mocha, MD   Anesthesiologist:                  Laurie Panda,  MD   Echocardiographer:              Rudean Haskell, MD   Pre-operative Echo Findings: Severe aortic stenosis and mild AI Normal left ventricular systolic function   Post-operative Echo Findings: No paravalvular leak Unchanged/normal left ventricular systolic function _____________  10/01/21 Echocardiogram 09/30/21:  1. The aortic valve has been replaced by a 29 mm Edwards Sapien Valve.  Aortic valve regurgitation is not visualized. Aortic valve mean gradient  measures 8.0 mmHg.   2. Post Procedure peak gradient 13 mm Hg, mean gradient 8 mm Hg, DVI 0.4,  EOA 1.6 cm2.   3. Left ventricular ejection fraction, by estimation, is 55 to 60%. The  left ventricle has normal function. Left ventricular endocardial border  not optimally defined to evaluate regional wall motion. There is mild  concentric left ventricular  hypertrophy. Left ventricular diastolic function could not be evaluated.   4. Right ventricular systolic function is low normal. The right  ventricular size is normal. Tricuspid regurgitation signal is inadequate  for assessing PA pressure.   5. Left atrial size was mildly dilated.   6. The mitral valve was not well visualized. No evidence of mitral valve  regurgitation. Moderate mitral annular calcification.   7. The inferior vena cava is dilated in size with >50% respiratory  variability, suggesting right atrial pressure of 8 mmHg.    _______________________  Echo 10/29/21 IMPRESSIONS  1. Left ventricular ejection fraction, by estimation, is 60 to 65%. The left ventricle has normal function. The left ventricle has no regional wall motion  abnormalities. There is mild concentric left ventricular hypertrophy. Left ventricular diastolic  function could not be evaluated.  2. Right ventricular systolic function is normal. The right ventricular size is normal.  3. Left atrial size was mildly dilated.  4. Right atrial size was mildly dilated.  5. The mitral valve is normal in structure. Mild mitral valve regurgitation. No evidence of mitral stenosis. Moderate mitral annular calcification.  6. The aortic valve has been repaired/replaced. There is a 29 mm Sapien prosthetic, stented (TAVR) valve present in the aortic position.     Trivial perivalvular Aortic valve regurgitation is present. Aortic valve mean gradient measures 7.6 mmHg. Aortic valve Vmax measures 1.87 m/s. DI 0.51.  7. Aortic dilatation noted. There is borderline dilatation of the aortic root, measuring 37 mm.  8. A small pericardial effusion is present. The pericardial effusion is localized near the right atrium and localized near the right ventricle. There is no evidence of cardiac tamponade.  9. Compared to study dated 09/30/2021, the TAVR is stable. The peak/mean transaortic gradients are essentially unchanged and there it trivial perivalvular AI of the TAVR.    EKG:  EKG is  NOT ordered today.   Recent Labs: 07/23/2021: Magnesium 2.0 09/04/2021: BNP 323.1 09/25/2021: ALT 10 09/30/2021: BUN 16; Creatinine, Ser 0.91; Hemoglobin 11.7; Platelets 199; Potassium 3.5; Sodium 129  Recent Lipid Panel    Component Value Date/Time   CHOL 103 07/21/2021 0622   TRIG 38 07/21/2021 0622   HDL 35 (L) 07/21/2021 0622   CHOLHDL 2.9 07/21/2021 0622   VLDL 8 07/21/2021 0622   LDLCALC 60 07/21/2021 0622   Physical Exam:    VS:  BP 122/78   Pulse 97   Ht '6\' 1"'$  (1.854 m)   Wt 233 lb (105.7 kg)   SpO2 99%   BMI 30.74 kg/m     Wt Readings from Last 3 Encounters:  05/26/22 233 lb (105.7 kg)  12/24/21 232 lb (105.2 kg)  10/29/21 226 lb (102.5 kg)    General: Well developed,  well nourished, NAD Neck: Negative for carotid bruits. No JVD Lungs:Clear to ausculation bilaterally. No wheezes, rales, or rhonchi. Breathing is unlabored. Cardiovascular: Irregularly irregular. No murmurs Extremities: 1+ chronic bilateral edema wearing compression stockings Neuro: Alert and oriented. No focal deficits. No facial asymmetry. MAE spontaneously. Psych: Responds to questions appropriately with normal affect.    ASSESSMENT/PLAN:    Severe AS s/p TAVR: stable. Valve sounds good. The need for SBE was discussed; he has Amoxicillin. Continue on Xarelto. I will see him back for 1 year follow up and echo.    Permanent Afib: rate well controlled. Continue Xarelto  Chronic diastolic CHF: appears euvolemic. Chronic stable LE edema in compression stockings Continue lasix '40mg'$  daily. No changes made. Labs followed at PCP   HTN: Bp well controlled. No changes made   HLD: continue simvastatin.   Medication Adjustments/Labs and Tests Ordered: Current medicines are reviewed at length with the patient today.  Concerns regarding medicines are outlined above.  No orders of the defined types were placed in this encounter.   No orders of the defined types were placed in this encounter.    Patient Instructions  Medication Instructions:  Your physician recommends that you continue on your current medications as directed. Please refer to the Current Medication list given to you today.  *If you need a refill on your cardiac medications before your next appointment, please call your pharmacy*   Lab Work: None ordered   If you have labs (blood work) drawn today and your tests are completely normal, you will receive your results only by: [place] (if you have MyChart) OR A paper copy in the mail If you have any lab test that is abnormal or we need to change your treatment, we will call you to review the results.   Testing/Procedures: None ordered today    Follow-Up: Follow  up as scheduled :1}    Other Instructions   Important Information About Sugar         Signed, Angelena Form, PA-C  05/26/2022 1:48 PM     [facility]

## 2022-05-26 ENCOUNTER — Ambulatory Visit: Payer: Medicare Other | Admitting: Physician Assistant

## 2022-05-26 VITALS — BP 122/78 | HR 97 | Ht 73.0 in | Wt 233.0 lb

## 2022-05-26 DIAGNOSIS — I10 Essential (primary) hypertension: Secondary | ICD-10-CM

## 2022-05-26 DIAGNOSIS — I5032 Chronic diastolic (congestive) heart failure: Secondary | ICD-10-CM

## 2022-05-26 DIAGNOSIS — Z952 Presence of prosthetic heart valve: Secondary | ICD-10-CM

## 2022-05-26 DIAGNOSIS — I482 Chronic atrial fibrillation, unspecified: Secondary | ICD-10-CM | POA: Diagnosis not present

## 2022-05-26 DIAGNOSIS — E782 Mixed hyperlipidemia: Secondary | ICD-10-CM

## 2022-05-26 NOTE — Patient Instructions (Signed)
Medication Instructions:  Your physician recommends that you continue on your current medications as directed. Please refer to the Current Medication list given to you today.  *If you need a refill on your cardiac medications before your next appointment, please call your pharmacy*   Lab Work: None ordered   If you have labs (blood work) drawn today and your tests are completely normal, you will receive your results only by: MyChart Message (if you have MyChart) OR A paper copy in the mail If you have any lab test that is abnormal or we need to change your treatment, we will call you to review the results.   Testing/Procedures: None ordered today    Follow-Up: Follow up as scheduled    :1}    Other Instructions   Important Information About Sugar       

## 2022-07-13 ENCOUNTER — Other Ambulatory Visit (HOSPITAL_BASED_OUTPATIENT_CLINIC_OR_DEPARTMENT_OTHER): Payer: Self-pay | Admitting: Internal Medicine

## 2022-07-13 ENCOUNTER — Encounter: Payer: Medicare Other | Attending: Internal Medicine | Admitting: Internal Medicine

## 2022-07-13 DIAGNOSIS — Z85828 Personal history of other malignant neoplasm of skin: Secondary | ICD-10-CM | POA: Insufficient documentation

## 2022-07-13 DIAGNOSIS — Z7901 Long term (current) use of anticoagulants: Secondary | ICD-10-CM | POA: Insufficient documentation

## 2022-07-13 DIAGNOSIS — Z952 Presence of prosthetic heart valve: Secondary | ICD-10-CM | POA: Diagnosis not present

## 2022-07-13 DIAGNOSIS — L97818 Non-pressure chronic ulcer of other part of right lower leg with other specified severity: Secondary | ICD-10-CM | POA: Insufficient documentation

## 2022-07-13 DIAGNOSIS — I87311 Chronic venous hypertension (idiopathic) with ulcer of right lower extremity: Secondary | ICD-10-CM | POA: Insufficient documentation

## 2022-07-13 DIAGNOSIS — E11622 Type 2 diabetes mellitus with other skin ulcer: Secondary | ICD-10-CM | POA: Diagnosis not present

## 2022-07-13 DIAGNOSIS — I4891 Unspecified atrial fibrillation: Secondary | ICD-10-CM | POA: Insufficient documentation

## 2022-07-13 NOTE — Progress Notes (Signed)
SAVERIO, KADER ([id number]) Visit Report for 07/13/2022 Abuse Risk Screen Details Patient Name: William Sawyer, William Sawyer Date of Service: 07/13/2022 8:45 AM Medical Record Number: [id number] Patient Account Number: [id number] Date of Birth/Sex: 10/31/1933 (88 y.o. Male) Treating RN: William Sawyer Primary Care Iness Pangilinan: Benita Stabile Other Clinician: Referring Savan Ruta: Benita Stabile Treating Dannie Woolen/Extender: Tito Dine in Treatment: 0 Abuse Risk Screen Items Answer ABUSE RISK SCREEN: Has anyone close to you tried to hurt or harm you recentlyo No Do you feel uncomfortable with anyone in your familyo No Has anyone forced you do things that you didnot want to doo No Electronic Signature(s) Signed: 07/13/2022 3:54:37 PM By: William Coria RN Entered By: William Sawyer on 07/13/2022 08:48:43 William Sawyer, William Sawyer ([id number]) -------------------------------------------------------------------------------- Activities of Daily Living Details Patient Name: William Sawyer, William Sawyer Date of Service: 07/13/2022 8:45 AM Medical Record Number: [id number] Patient Account Number: [id number] Date of Birth/Sex: January 13, 1933 (88 y.o. Male) Treating RN: William Sawyer Primary Care Powell Halbert: Benita Stabile Other Clinician: Referring Arlean Thies: Benita Stabile Treating Oluwatobi Ruppe/Extender: Ricard Dillon Weeks in Treatment: 0 Activities of Daily Living Items Answer Activities of Daily Living (Please select one for each item) Drive Automobile Completely Able Take Medications Completely Able Use Telephone Completely [place] for Appearance Completely Able Use Toilet Completely Able Bath / Shower Completely Able Dress Self Completely Able Feed Self Completely Able Walk Completely Able Get In / Out Bed Completely [place] for Self Completely Able Electronic Signature(s) Signed: 07/13/2022 3:54:37 PM By: William Coria RN Entered By: William Sawyer  on 07/13/2022 08:49:08 William Sawyer ([id number]) -------------------------------------------------------------------------------- Education Screening Details Patient Name: William Sawyer Date of Service: 07/13/2022 8:45 AM Medical Record Number: [id number] Patient Account Number: [id number] Date of Birth/Sex: [birth year] (88 y.o. Male) Treating RN: William Sawyer Primary Care Sheridan Gettel: Benita Stabile Other Clinician: Referring Sanvika Cuttino: Benita Stabile Treating Genean Adamski/Extender: Tito Dine in Treatment: 0 Primary Learner Assessed: Patient Learning Preferences/Education Level/Primary Language Learning Preference: Explanation Highest Education Level: College or Above Preferred Language: English Cognitive Barrier Language Barrier: No Translator Needed: No Memory Deficit: No Emotional Barrier: No Cultural/Religious Beliefs Affecting Medical Care: No Physical Barrier Impaired Vision: Yes Glasses Impaired Hearing: Yes Hearing Aid Decreased Hand dexterity: No Knowledge/Comprehension Knowledge Level: Medium Comprehension Level: High Ability to understand written instructions: High Ability to understand verbal instructions: High Motivation Anxiety Level: Anxious Cooperation: Cooperative Education Importance: Acknowledges Need Interest in Health Problems: Asks Questions Perception: Coherent Willingness to Engage in Self-Management High Activities: Readiness to Engage in Self-Management High Activities: Electronic Signature(s) Signed: 07/13/2022 3:54:37 PM By: William Coria RN Entered By: William Sawyer on 07/13/2022 08:49:44 William Sawyer, William Sawyer ([id number]) -------------------------------------------------------------------------------- Fall Risk Assessment Details Patient Name: William Sawyer Date of Service: 07/13/2022 8:45 AM Medical Record Number: [id number] Patient Account Number: [id number] Date of Birth/Sex: 1933/04/09 (88 y.o. Male) Treating RN: William Sawyer Primary Care  Nikira Kushnir: Benita Stabile Other Clinician: Referring Fayez Sturgell: Benita Stabile Treating Gertrude Tarbet/Extender: Ricard Dillon Weeks in Treatment: 0 Fall Risk Assessment Items Have you had 2 or more falls in the last 12 monthso 0 No Have you had any fall that resulted in injury in the last 12 monthso 0 No FALLS RISK SCREEN History of falling - immediate or within 3 months 0 No Secondary diagnosis (Do you have 2 or more medical diagnoseso) 0 No Ambulatory aid None/bed rest/wheelchair/nurse 0 No Crutches/cane/walker 0 No Furniture 0 No Intravenous therapy Access/Saline/Heparin Lock 0 No Gait/Transferring Normal/ bed rest/ wheelchair 0 No Weak (short steps with or  without shuffle, stooped but able to lift head while walking, may 0 No seek support from furniture) Impaired (short steps with shuffle, may have difficulty arising from chair, head down, impaired 0 No balance) Mental Status Oriented to own ability 0 No Electronic Signature(s) Signed: 07/13/2022 3:54:37 PM By: William Coria RN Entered By: William Sawyer on 07/13/2022 08:49:50 William Sawyer, William Sawyer ([id number]) -------------------------------------------------------------------------------- Foot Assessment Details Patient Name: William Sawyer Date of Service: 07/13/2022 8:45 AM Medical Record Number: [id number] Patient Account Number: [id number] Date of Birth/Sex: 04-Sep-1933 (88 y.o. Male) Treating RN: William Sawyer Primary Care Alaiah Lundy: Benita Stabile Other Clinician: Referring Jere Bostrom: Benita Stabile Treating Kristjan Derner/Extender: Ricard Dillon Weeks in Treatment: 0 Foot Assessment Items Site Locations + = Sensation present, - = Sensation absent, C = Callus, U = Ulcer R = Redness, W = Warmth, M = Maceration, PU = Pre-ulcerative lesion F = Fissure, S = Swelling, D = Dryness Assessment Right: Left: Other Deformity: No No Prior Foot Ulcer: No No Prior Amputation: No No Charcot Joint: No No Ambulatory Status: Ambulatory Without Help Gait:  Steady Electronic Signature(s) Signed: 07/13/2022 3:54:37 PM By: William Coria RN Entered By: William Sawyer on 07/13/2022 08:52:02 William Sawyer, William Sawyer ([id number]) -------------------------------------------------------------------------------- Nutrition Risk Screening Details Patient Name: William Sawyer Date of Service: 07/13/2022 8:45 AM Medical Record Number: [id number] Patient Account Number: [id number] Date of Birth/Sex: 10-19-1933 (88 y.o. Male) Treating RN: William Sawyer Primary Care Barney Russomanno: Benita Stabile Other Clinician: Referring Beaux Wedemeyer: Benita Stabile Treating Jessyca Sloan/Extender: Ricard Dillon Weeks in Treatment: 0 Height (in): 73 Weight (lbs): 229 Body Mass Index (BMI): 30.2 Nutrition Risk Screening Items Score Screening NUTRITION RISK SCREEN: I have an illness or condition that made me change the kind and/or amount of food I eat 0 No I eat fewer than two meals per day 0 No I eat few fruits and vegetables, or milk products 0 No I have three or more drinks of beer, liquor or wine almost every day 0 No I have tooth or mouth problems that make it hard for me to eat 0 No I don't always have enough money to buy the food I need 0 No I eat alone most of the time 0 No I take three or more different prescribed or over-the-counter drugs a day 1 Yes Without wanting to, I have lost or gained 10 pounds in the last six months 0 No I am not always physically able to shop, cook and/or feed myself 0 No Nutrition Protocols Good Risk Protocol 0 No interventions needed Moderate Risk Protocol High Risk Proctocol Risk Level: Good Risk Score: 1 Electronic Signature(s) Signed: 07/13/2022 3:54:37 PM By: William Coria RN Entered By: William Sawyer on 07/13/2022 08:50:02

## 2022-07-13 NOTE — Progress Notes (Signed)
KAHNE, HELFAND ([id number]) Visit Report for 07/13/2022 Biopsy Details Patient Name: William Sawyer, William Sawyer Date of Service: 07/13/2022 8:45 AM Medical Record Number: [id number] Patient Account Number: [id number] Date of Birth/Sex: [birth year] (88 y.o. Male) Treating RN: Carlene Coria Primary Care Provider: Benita Stabile Other Clinician: Referring Provider: Benita Stabile Treating Provider/Extender: Ricard Dillon Weeks in Treatment: 0 Biopsy Performed for: Wound #2 Left, Lateral Lower Leg Location(s): Wound Bed Performed By: Physician Ricard Dillon, MD Tissue Punch: No Number of Specimens Taken: 1 Specimen Sent To Pathology: Yes Level of Consciousness (Pre-procedure): Awake and Alert Pre-procedure Verification/Time-Out Taken: Yes - 09:15 Pain Control: Lidocaine Injectable Lidocaine Percent: 2% Instrument: Blade, Forceps Bleeding: Large Hemostasis Achieved: Silver Nitrate Procedural Pain: 0 Post Procedural Pain: 0 Response to Treatment: Procedure was tolerated well Level of Consciousness (Post-procedure): Awake and Alert Post Procedure Diagnosis Same as Pre-procedure Electronic Signature(s) Signed: 07/13/2022 9:52:38 AM By: Carlene Coria RN Signed: 07/13/2022 4:13:45 PM By: Linton Ham MD Previous Signature: 07/13/2022 9:46:35 AM Version By: Carlene Coria RN Entered By: Carlene Coria on 07/13/2022 09:52:38 William Sawyer ([id number]) -------------------------------------------------------------------------------- Chief Complaint Document Details Patient Name: William Sawyer Date of Service: 07/13/2022 8:45 AM Medical Record Number: [id number] Patient Account Number: [id number] Date of Birth/Sex: 02/11/1933 (88 y.o. Male) Treating RN: Carlene Coria Primary Care Provider: Benita Stabile Other Clinician: Referring Provider: Benita Stabile Treating Provider/Extender: Ricard Dillon Weeks in Treatment: 0 Information Obtained from: Patient Chief Complaint 7/25; patient is here for review of 2  open areas on the right lower leg Electronic Signature(s) Signed: 07/13/2022 4:13:45 PM By: Linton Ham MD Entered By: Linton Ham on 07/13/2022 09:25:20 William Sawyer ([id number]) -------------------------------------------------------------------------------- Debridement Details Patient Name: William Sawyer Date of Service: 07/13/2022 8:45 AM Medical Record Number: [id number] Patient Account Number: [id number] Date of Birth/Sex: 11-Mar-1933 (88 y.o. Male) Treating RN: Carlene Coria Primary Care Provider: Benita Stabile Other Clinician: Referring Provider: Benita Stabile Treating Provider/Extender: Ricard Dillon Weeks in Treatment: 0 Debridement Performed for Wound #1 Right,Anterior Lower Leg Assessment: Performed By: Physician Ricard Dillon, MD Debridement Type: Chemical/Enzymatic/Mechanical Agent Used: saline gauze Level of Consciousness (Pre- Awake and Alert procedure): Pre-procedure Verification/Time Out Yes - 09:00 Taken: Start Time: 09:00 Instrument: Other : saline gauze Bleeding: Minimum Hemostasis Achieved: Pressure End Time: 09:05 Procedural Pain: 0 Post Procedural Pain: 0 Response to Treatment: Procedure was tolerated well Level of Consciousness (Post- Awake and Alert procedure): Post Debridement Measurements of Total Wound Length: (cm) 2 Width: (cm) 1 Depth: (cm) 0.1 Volume: (cm) 0.157 Character of Wound/Ulcer Post Debridement: Improved Post Procedure Diagnosis Same as Pre-procedure Electronic Signature(s) Signed: 07/13/2022 9:53:53 AM By: Carlene Coria RN Signed: 07/13/2022 4:13:45 PM By: Linton Ham MD Entered By: Carlene Coria on 07/13/2022 09:53:53 William Sawyer ([id number]) -------------------------------------------------------------------------------- Debridement Details Patient Name: William Sawyer Date of Service: 07/13/2022 8:45 AM Medical Record Number: [id number] Patient Account Number: [id number] Date of Birth/Sex: 1933/02/13 (88  y.o. Male) Treating RN: Carlene Coria Primary Care Provider: Benita Stabile Other Clinician: Referring Provider: Benita Stabile Treating Provider/Extender: Ricard Dillon Weeks in Treatment: 0 Debridement Performed for Wound #2 Left,Lateral Lower Leg Assessment: Performed By: Physician Ricard Dillon, MD Debridement Type: Chemical/Enzymatic/Mechanical Agent Used: saline gauze Level of Consciousness (Pre- Awake and Alert procedure): Pre-procedure Verification/Time Out Yes - 09:00 Taken: Start Time: 09:00 Instrument: Other : saline gauze Bleeding: Minimum Hemostasis Achieved: Pressure End Time: 09:05 Procedural Pain: 0 Post Procedural Pain: 0 Response to Treatment: Procedure was tolerated well Level of Consciousness (Post- Awake and Alert procedure): Post Debridement Measurements of Total  Wound Length: (cm) 4 Width: (cm) 3.5 Depth: (cm) 0.2 Volume: (cm) 2.199 Character of Wound/Ulcer Post Debridement: Improved Post Procedure Diagnosis Same as Pre-procedure Electronic Signature(s) Signed: 07/13/2022 9:54:19 AM By: Carlene Coria RN Signed: 07/13/2022 4:13:45 PM By: Linton Ham MD Entered By: Carlene Coria on 07/13/2022 09:54:19 William Sawyer ([id number]) -------------------------------------------------------------------------------- HPI Details Patient Name: William Sawyer Date of Service: 07/13/2022 8:45 AM Medical Record Number: [id number] Patient Account Number: [id number] Date of Birth/Sex: December 18, 1933 (88 y.o. Male) Treating RN: Carlene Coria Primary Care Provider: Benita Stabile Other Clinician: Referring Provider: Benita Stabile Treating Provider/Extender: Ricard Dillon Weeks in Treatment: 0 History of Present Illness HPI Description: ADMISSION 07/14/2023 This is a 88 year old man who lives independently. He is here for review of 2 wounds on the right leg 1 laterally and one anterior. The major area seems to be laterally where he has had a progressively  enlarging wound for about 6 months. He is not really specifically addressing this. He tells me he has a history of squamous cell carcinomas but they are on his arms and his forehead not his legs. He also has a smaller scabbed area on the anterior upper tibia. He says he gets these every now and then and they fall off and heal. He says the current one has been there for about a month. 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 He is on Xarelto for prophylaxis with his A-fib but tells me he has not taken this in 2 days out of anticipation that I might have to biopsy the area on the right lower leg ABI on the left was 1.15 Electronic Signature(s) Signed: 07/13/2022 4:13:45 PM By: Linton Ham MD Entered By: Linton Ham on 07/13/2022 09:27:38 William Sawyer ([id number]) -------------------------------------------------------------------------------- Physical Exam Details Patient Name: William Sawyer Date of Service: 07/13/2022 8:45 AM Medical Record Number: [id number] Patient Account Number: [id number] Date of Birth/Sex: [birth year] (88 y.o. Male) Treating RN: Carlene Coria Primary Care Provider: Benita Stabile Other Clinician: Referring Provider: Benita Stabile Treating Provider/Extender: Ricard Dillon Weeks in Treatment: 0 Constitutional Patient is hypertensive.. Pulse regular and within target range for patient.Marland Kitchen Respirations regular, non-labored and within target range.. Temperature is normal and within the target range for the patient.Marland Kitchen appears in no distress. Respiratory Respiratory effort is easy and symmetric bilaterally. Rate is normal at rest and on room air.. Cardiovascular In spite of the pitting edema his dorsalis pedis and posterior tibial pulses are palpable. Edema present in both extremities.3+  pitting. Notes Wound exam o The major areas on the right lateral leg. Mushroom-shaped protruding wound area. No evidence of surrounding infection. I use injectable lidocaine to anesthetize a small section of this and did a wedge biopsy hemostasis with silver nitrate and direct pressure. I suspect this will be a squamous cell carcinoma o He has a small wound with a black eschar that washed off in preparation to be seen today. There was significant bleeding which responded to direct pressure. I suspect this is more of a venous insufficiency issue Electronic Signature(s) Signed: 07/13/2022 4:13:45 PM By: Linton Ham MD Entered By: Linton Ham on 07/13/2022 09:37:29 DONI, WIDMER ([id number]) -------------------------------------------------------------------------------- Physician Orders Details Patient Name: William Sawyer Date of Service: 07/13/2022 8:45 AM Medical Record Number: [id number] Patient Account Number: [id number] Date of Birth/Sex: 02-01-1933 (88 y.o. Male) Treating RN: Carlene Coria Primary Care Provider: Benita Stabile Other Clinician: Referring Provider: Hall Busing,  Sharlet [place] Treating Provider/Extender: Tito Dine in Treatment: 0 Verbal / Phone Orders: No Diagnosis Coding Follow-up Appointments o Return Appointment in 1 week. Bathing/ Shower/ Hygiene o May shower with wound dressing protected with water repellent cover or cast protector. o No tub bath. Edema Control - Lymphedema / Segmental Compressive Device / Other o Elevate, Exercise Daily and Avoid Standing for Long Periods of Time. o Elevate legs to the level of the heart and pump ankles as often as possible o Elevate leg(s) parallel to the floor when sitting. Wound Treatment Wound #1 - Lower Leg Wound Laterality: Right, Anterior Cleanser: Byram Ancillary Kit - 15 Day Supply (DME) (Generic) Every Other Day/30 Days Discharge Instructions: Use supplies as instructed; Kit contains: (15) Saline  Bullets; (15) 3x3 Gauze; 15 pr Gloves Cleanser: Soap and Water Every Other Day/30 Days Discharge Instructions: Gently cleanse wound with antibacterial soap, rinse and pat dry prior to dressing wounds Cleanser: Wound Cleanser Every Other Day/30 Days Discharge Instructions: Wash your hands with soap and water. Remove old dressing, discard into plastic bag and place into trash. Cleanse the wound with Wound Cleanser prior to applying a clean dressing using gauze sponges, not tissues or cotton balls. Do not scrub or use excessive force. Pat dry using gauze sponges, not tissue or cotton balls. Primary Dressing: Silvercel 4 1/4x 4 1/4 (in/in) (DME) (Generic) Every Other Day/30 Days Discharge Instructions: Apply Silvercel 4 1/4x 4 1/4 (in/in) as instructed Secondary Dressing: (SILICONE BORDER) Zetuvit Plus SILICONE BORDER Dressing 4x4 (in/in) (DME) (Generic) Every Other Day/30 Days Discharge Instructions: Please do not put silicone bordered dressings under wraps. Use non-bordered dressing only. Wound #2 - Lower Leg Wound Laterality: Left, Lateral Cleanser: Byram Ancillary Kit - 15 Day Supply (DME) (Generic) Every Other Day/30 Days Discharge Instructions: Use supplies as instructed; Kit contains: (15) Saline Bullets; (15) 3x3 Gauze; 15 pr Gloves Cleanser: Soap and Water Every Other Day/30 Days Discharge Instructions: Gently cleanse wound with antibacterial soap, rinse and pat dry prior to dressing wounds Cleanser: Wound Cleanser Every Other Day/30 Days Discharge Instructions: Wash your hands with soap and water. Remove old dressing, discard into plastic bag and place into trash. Cleanse the wound with Wound Cleanser prior to applying a clean dressing using gauze sponges, not tissues or cotton balls. Do not scrub or use excessive force. Pat dry using gauze sponges, not tissue or cotton balls. Primary Dressing: Silvercel 4 1/4x 4 1/4 (in/in) (DME) (Generic) Every Other Day/30 Days Discharge Instructions:  Apply Silvercel 4 1/4x 4 1/4 (in/in) as instructed Secondary Dressing: (NON-BORDER) Zetuvit Plus Silicone NON-BORDER 5x5 (in/in) (DME) (Generic) Every Other Day/30 Days Discharge Instructions: Please do not put silicone bordered dressings under wraps. Use non-bordered dressing only under wraps. Electronic Signature(s) Signed: 07/13/2022 3:54:37 PM By: Carlene Coria RN Signed: 07/13/2022 4:13:45 PM By: Linton Ham MD [place], Shelly Flatten ([id number]) Entered By: Carlene Coria on 07/13/2022 09:56:20 JAVARES, KAUFHOLD ([id number]) -------------------------------------------------------------------------------- Problem List Details Patient Name: William Sawyer Date of Service: 07/13/2022 8:45 AM Medical Record Number: [id number] Patient Account Number: [id number] Date of Birth/Sex: [birth year] (88 y.o. Male) Treating RN: Carlene Coria Primary Care Provider: Benita Stabile Other Clinician: Referring Provider: Benita Stabile Treating Provider/Extender: Tito Dine in Treatment: 0 Active Problems ICD-10 Encounter Code Description Active Date MDM Diagnosis L97.818 Non-pressure chronic ulcer of other part of right lower leg with other 07/13/2022 No Yes specified severity C44.722 Squamous cell carcinoma of skin of right lower limb, including hip 07/13/2022 No Yes I87.311 Chronic venous hypertension (idiopathic) with  ulcer of right lower 07/13/2022 No Yes extremity E11.622 Type 2 diabetes mellitus with other skin ulcer 07/13/2022 No Yes Inactive Problems Resolved Problems Electronic Signature(s) Signed: 07/13/2022 4:13:45 PM By: Linton Ham MD Entered By: Linton Ham on 07/13/2022 09:23:52 William Sawyer ([id number]) -------------------------------------------------------------------------------- Progress Note Details Patient Name: William Sawyer Date of Service: 07/13/2022 8:45 AM Medical Record Number: [id number] Patient Account Number: [id number] Date of Birth/Sex: 03-Oct-1933 (87 y.o.  Male) Treating RN: Carlene Coria Primary Care Provider: Benita Stabile Other Clinician: Referring Provider: Benita Stabile Treating Provider/Extender: Ricard Dillon Weeks in Treatment: 0 Subjective Chief Complaint Information obtained from Patient 7/25; patient is here for review of 2 open areas on the right lower leg History of Present Illness (HPI) ADMISSION 07/14/2023 This is a 88 year old man who lives independently. He is here for review of 2 wounds on the right leg 1 laterally and one anterior. The major area seems to be laterally where he has had a progressively enlarging wound for about 6 months. He is not really specifically addressing this. He tells me he has a history of squamous cell carcinomas but they are on his arms and his forehead not his legs. He also has a smaller scabbed area on the anterior upper tibia. He says he gets these every now and then and they fall off and heal. He says the current one has been there for about a month. 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 He is on Xarelto for prophylaxis with his A-fib but tells me he has not taken this in 2 days out of anticipation that I might have to biopsy the area on the right lower leg ABI on the left was 1.15 Patient History Allergies amlodipine, diltiazem Social History Never smoker, Marital Status - Married, Alcohol Use - Rarely, Drug Use - No History, Caffeine Use - Never. Medical History Cardiovascular Patient has history of Arrhythmia - a fib, Congestive Heart Failure, Hypertension Endocrine Patient has history of Type II Diabetes Patient is treated with Insulin, Oral Agents. Review of Systems (ROS) Eyes Complains or has symptoms of Glasses / Contacts. Integumentary (Skin) Complains or has symptoms of  Wounds. Objective Constitutional Patient is hypertensive.. Pulse regular and within target range for patient.Marland Kitchen Respirations regular, non-labored and within target range.. Temperature is normal and within the target range for the patient.Marland Kitchen appears in no distress. Vitals Time Taken: 8:42 AM, Height: 73 in, Source: Stated, Weight: 229 lbs, Source: Stated, BMI: 30.2, Temperature: 98.3 F, Pulse: 60 bpm, Respiratory Rate: 18 breaths/min, Blood Pressure: 156/97 mmHg. Respiratory Respiratory effort is easy and symmetric bilaterally. Rate is normal at rest and on room air.Chaney Born, Shelly Flatten ([id number]) Cardiovascular In spite of the pitting edema his dorsalis pedis and posterior tibial pulses are palpable. Edema present in both extremities.3+ pitting. General Notes: Wound exam The major areas on the right lateral leg. Mushroom-shaped protruding wound area. No evidence of surrounding infection. I use injectable lidocaine to anesthetize a small section of this and did a wedge biopsy hemostasis with silver nitrate and direct pressure. I suspect this will be a squamous cell carcinoma He has a small wound with a black eschar that washed off in preparation to be seen today. There was significant bleeding which responded to direct pressure. I suspect this is more of a venous insufficiency issue Integumentary (Hair, Skin) Wound #1 status is Open. Original cause of wound  was Other Lesion. The date acquired was: 05/20/2022. The wound is located on the Right,Anterior Lower Leg. The wound measures 2cm length x 1cm width x 0.1cm depth; 1.571cm^2 area and 0.157cm^3 volume. There is Fat Layer (Subcutaneous Tissue) exposed. There is no tunneling or undermining noted. There is a medium amount of serosanguineous drainage noted. There is large (67-100%) pink, hyper - granulation within the wound bed. There is no necrotic tissue within the wound bed. Wound #2 status is Open. Original cause of wound was Other Lesion. The date  acquired was: 11/19/2021. The wound is located on the Left,Lateral Lower Leg. The wound measures 4cm length x 3.5cm width x 0.2cm depth; 10.996cm^2 area and 2.199cm^3 volume. There is Fat Layer (Subcutaneous Tissue) exposed. There is no tunneling or undermining noted. There is a medium amount of serosanguineous drainage noted. There is medium (34-66%) pink, pale, friable, hyper - granulation within the wound bed. There is a medium (34-66%) amount of necrotic tissue within the wound bed including Adherent Slough. Assessment Active Problems ICD-10 Non-pressure chronic ulcer of other part of right lower leg with other specified severity Squamous cell carcinoma of skin of right lower limb, including hip Chronic venous hypertension (idiopathic) with ulcer of right lower extremity Type 2 diabetes mellitus with other skin ulcer Procedures Wound #2 Pre-procedure diagnosis of Wound #2 is an Atypical located on the Left, Lateral Lower Leg . There was a biopsy performed by Ricard Dillon, MD. The skin was cleansed and prepped with anti-septic followed by pain control using Lidocaine Injectable: 1%. Tissue was removed at its base with the following instrument(s): Blade and Forceps and sent to pathology. A time out was conducted at 09:15, prior to the start of the procedure. The procedure was tolerated well with a pain level of 0 throughout and a pain level of 0 following the procedure. Post procedure Diagnosis Wound #2: Same as Pre-Procedure Plan Follow-up Appointments: Return Appointment in 1 week. Bathing/ Shower/ Hygiene: May shower with wound dressing protected with water repellent cover or cast protector. No tub bath. Edema Control - Lymphedema / Segmental Compressive Device / Other: Elevate, Exercise Daily and Avoid Standing for Long Periods of Time. Elevate legs to the level of the heart and pump ankles as often as possible Elevate leg(s) parallel to the floor when sitting. WOUND #1: -  Lower Leg Wound Laterality: Right, Anterior Primary Dressing: Silvercel 4 1/4x 4 1/4 (in/in) Every Other Day/30 Days Discharge Instructions: Apply Silvercel 4 1/4x 4 1/4 (in/in) as instructed Secondary Dressing: (SILICONE BORDER) Zetuvit Plus SILICONE BORDER Dressing 4x4 (in/in) Every Other Day/30 Days Discharge Instructions: Please do not put silicone bordered dressings under wraps. Use non-bordered dressing only. WOUND #2: - Lower Leg Wound Laterality: Left, Lateral Primary Dressing: Silvercel 4 1/4x 4 1/4 (in/in) Every Other Day/30 Days Discharge Instructions: Apply Silvercel 4 1/4x 4 1/4 (in/in) as instructed Secondary Dressing: (SILICONE BORDER) Zetuvit Plus SILICONE BORDER Dressing 4x4 (in/in) Every Other Day/30 Days Discharge Instructions: Please do not put silicone bordered dressings under wraps. Use non-bordered dressing only. [place], [place] ([id number]) 1. Wedge biopsy for pathology. I suspect this will be squamous cell carcinoma that has been enlarging for 6 months. If I am correct he will need to see skin surgery. He would need to stop Xarelto in coordination with Dr. Burt Knack who is his cardiologist. I am uncertain what the risk of doing this would be 2. He has a small ulcer on the anterior right tibia. This looks clean after the eschar was removed  he says he gets these from time to time and they seem to heal very easily. 3. We put silver alginate on both these areas border foam and asked him to use his stockings changing every 2. We will review the pathology in 1 week Electronic Signature(s) Signed: 07/13/2022 4:13:45 PM By: Linton Ham MD Entered By: Linton Ham on 07/13/2022 09:40:00 William Sawyer ([id number]) -------------------------------------------------------------------------------- ROS/PFSH Details Patient Name: William Sawyer Date of Service: 07/13/2022 8:45 AM Medical Record Number: [id number] Patient Account Number: [id number] Date of Birth/Sex: [birth year]  (88 y.o. Male) Treating RN: Carlene Coria Primary Care Provider: Benita Stabile Other Clinician: Referring Provider: Benita Stabile Treating Provider/Extender: Ricard Dillon Weeks in Treatment: 0 Eyes Complaints and Symptoms: Positive for: Glasses / Contacts Integumentary (Skin) Complaints and Symptoms: Positive for: Wounds Cardiovascular Medical History: Positive for: Arrhythmia - a fib; Congestive Heart Failure; Hypertension Endocrine Medical History: Positive for: Type II Diabetes Time with diabetes: 65 Treated with: Insulin, Oral agents Immunizations Pneumococcal Vaccine: Received Pneumococcal Vaccination: Yes Received Pneumococcal Vaccination On or After 60th Birthday: Yes Implantable Devices None Family and Social History Never smoker; Marital Status - Married; Alcohol Use: Rarely; Drug Use: No History; Caffeine Use: Never Electronic Signature(s) Signed: 07/13/2022 3:54:37 PM By: Carlene Coria RN Signed: 07/13/2022 4:13:45 PM By: Linton Ham MD Entered By: Carlene Coria on 07/13/2022 08:48:37 KENNETT, SYMES ([id number]) -------------------------------------------------------------------------------- [place] Details Patient Name: William Sawyer Date of Service: 07/13/2022 Medical Record Number: [id number] Patient Account Number: [id number] Date of Birth/Sex: March 12, 1933 (88 y.o. Male) Treating RN: Carlene Coria Primary Care Provider: Benita Stabile Other Clinician: Referring Provider: Benita Stabile Treating Provider/Extender: Ricard Dillon Weeks in Treatment: 0 Diagnosis Coding ICD-10 Codes Code Description [phone] Non-pressure chronic ulcer of other part of right lower leg with other specified severity C44.722 Squamous cell carcinoma of skin of right lower limb, including hip I87.311 Chronic venous hypertension (idiopathic) with ulcer of right lower extremity E11.622 Type 2 diabetes mellitus with other skin ulcer Facility Procedures CPT4 Code: [id number] Description:  99213 - WOUND CARE VISIT-LEV 3 EST PT Modifier: Quantity: 1 Physician Procedures CPT4 Code: 3007622 Description: WC PHYS LEVEL 3 o NEW PT Modifier: Quantity: 1 CPT4 Code: Description: ICD-10 Diagnosis Description L97.818 Non-pressure chronic ulcer of other part of right lower leg with other C44.722 Squamous cell carcinoma of skin of right lower limb, including hip I87.311 Chronic venous hypertension (idiopathic) with  ulcer of right lower extr E11.622 Type 2 diabetes mellitus with other skin ulcer Modifier: specified severity emity Quantity: Electronic Signature(s) Signed: 07/13/2022 9:57:52 AM By: Carlene Coria RN Signed: 07/13/2022 4:13:45 PM By: Linton Ham MD Entered By: Carlene Coria on 07/13/2022 09:57:52

## 2022-07-13 NOTE — Progress Notes (Signed)
William Sawyer ([id number]) Visit Report for 07/13/2022 Allergy List Details Patient Name: William Sawyer, William Sawyer Date of Service: 07/13/2022 8:45 AM Medical Record Number: [id number] Patient Account Number: [id number] Date of Birth/Sex: [birth year] (88 y.o. Male) Treating RN: Carlene Coria Primary Care Flora Ratz: Benita Stabile Other Clinician: Referring Reagen Haberman: Benita Stabile Treating Denajah Farias/Extender: Ricard Dillon Weeks in Treatment: 0 Allergies Active Allergies amlodipine diltiazem Allergy Notes Electronic Signature(s) Signed: 07/13/2022 3:54:37 PM By: Carlene Coria RN Entered By: Carlene Coria on 07/13/2022 08:43:19 William Sawyer ([id number]) -------------------------------------------------------------------------------- [place] Details Patient Name: William Sawyer Date of Service: 07/13/2022 8:45 AM Medical Record Number: [id number] Patient Account Number: [id number] Date of Birth/Sex: January 07, 1933 (88 y.o. Male) Treating RN: Carlene Coria Primary Care Unnamed Zeien: Benita Stabile Other Clinician: Referring Roxanna Mcever: Benita Stabile Treating Ayub Kirsh/Extender: Tito Dine in Treatment: 0 Visit Information Patient Arrived: William Sawyer Arrival Time: 08:40 Accompanied By: self Transfer Assistance: None Patient Identification Verified: Yes Secondary Verification Process Completed: Yes Patient Requires Transmission-Based No Precautions: Patient Has Alerts: Yes Patient Alerts: Patient on Blood Thinner DIABETIC Electronic Signature(s) Signed: 07/13/2022 3:54:37 PM By: Carlene Coria RN Entered By: Carlene Coria on 07/13/2022 08:41:06 [place], Shelly Flatten ([id number]) -------------------------------------------------------------------------------- Clinic Level of Care Assessment Details Patient Name: William Sawyer Date of Service: 07/13/2022 8:45 AM Medical Record Number: [id number] Patient Account Number: [id number] Date of Birth/Sex: 1933/01/23 (88 y.o. Male) Treating RN: Carlene Coria Primary Care Devonte Migues: Benita Stabile Other Clinician: Referring Hamna Asa: Benita Stabile Treating Ezekiel Menzer/Extender: Ricard Dillon Weeks in Treatment: 0 Clinic Level of Care Assessment Items TOOL 1 Quantity Score X - Use when EandM and Procedure is performed on INITIAL visit 1 0 ASSESSMENTS - Nursing Assessment / Reassessment X - General Physical Exam (combine w/ comprehensive assessment (listed just below) when performed on new 1 20 pt. evals) X- 1 25 Comprehensive Assessment (HX, ROS, Risk Assessments, Wounds Hx, etc.) ASSESSMENTS - Wound and Skin Assessment / Reassessment []  - Dermatologic / Skin Assessment (not related to wound area) 0 ASSESSMENTS - Ostomy and/or Continence Assessment and Care []  - Incontinence Assessment and Management 0 []  - 0 Ostomy Care Assessment and Management (repouching, etc.) PROCESS - Coordination of Care X - Simple Patient / Family Education for ongoing care 1 15 []  - 0 Complex (extensive) Patient / Family Education for ongoing care X- 1 10 Staff obtains Programmer, systems, Records, Test Results / Process Orders []  - 0 Staff telephones HHA, Nursing Homes / Clarify orders / etc []  - 0 Routine Transfer to another Facility (non-emergent condition) []  - 0 Routine Hospital Admission (non-emergent condition) X- 1 15 New Admissions / Biomedical engineer / Ordering NPWT, Apligraf, etc. []  - 0 Emergency Hospital Admission (emergent condition) PROCESS - Special Needs []  - Pediatric / Minor Patient Management 0 []  - 0 Isolation Patient Management []  - 0 Hearing / Language / Visual special needs []  - 0 Assessment of Community assistance (transportation, D/C planning, etc.) []  - 0 Additional assistance / Altered mentation []  - 0 Support Surface(s) Assessment (bed, cushion, seat, etc.) INTERVENTIONS - Miscellaneous []  - External ear exam 0 []  - 0 Patient Transfer (multiple staff / Civil Service fast streamer / Similar devices) []  - 0 Simple Staple / Suture  removal (25 or less) []  - 0 Complex Staple / Suture removal (26 or more) []  - 0 Hypo/Hyperglycemic Management (do not check if billed separately) X- 1 15 Ankle / Brachial Index (ABI) - do not check if billed separately Has the patient been seen at the hospital within the last three years: Yes Total Score: 100  Level Of Care: New/Established - Level 3 [place], [place] ([id number]) Electronic Signature(s) Signed: 07/13/2022 3:54:37 PM By: Carlene Coria RN Entered By: Carlene Coria on 07/13/2022 09:57:04 William Sawyer ([id number]) -------------------------------------------------------------------------------- Encounter Discharge Information Details Patient Name: William Sawyer Date of Service: 07/13/2022 8:45 AM Medical Record Number: [id number] Patient Account Number: [id number] Date of Birth/Sex: 1933/05/30 (88 y.o. Male) Treating RN: Carlene Coria Primary Care Jacinta Penalver: Benita Stabile Other Clinician: Referring Trannie Bardales: Benita Stabile Treating Aslee Such/Extender: Tito Dine in Treatment: 0 Encounter Discharge Information Items Post Procedure Vitals Discharge Condition: Stable Temperature (F): 98.3 Ambulatory Status: Cane Pulse (bpm): 60 Discharge Destination: Home Respiratory Rate (breaths/min): 18 Transportation: Private Auto Blood Pressure (mmHg): 156/97 Accompanied By: self Schedule Follow-up Appointment: Yes Clinical Summary of Care: Electronic Signature(s) Signed: 07/13/2022 10:00:19 AM By: Carlene Coria RN Entered By: Carlene Coria on 07/13/2022 10:00:19 William Sawyer ([id number]) -------------------------------------------------------------------------------- Lower Extremity Assessment Details Patient Name: William Sawyer Date of Service: 07/13/2022 8:45 AM Medical Record Number: [id number] Patient Account Number: [id number] Date of Birth/Sex: 04/05/1933 (88 y.o. Male) Treating RN: Carlene Coria Primary Care William Sawyer: Benita Stabile Other Clinician: Referring William Sawyer:  Benita Stabile Treating William Sawyer/Extender: Ricard Dillon Weeks in Treatment: 0 Edema Assessment Assessed: [Left: No] [Right: No] Edema: [Left: Ye] [Right: s] Calf Left: Right: Point of Measurement: 36 cm From Medial Instep 46 cm Ankle Left: Right: Point of Measurement: 12 cm From Medial Instep 29 cm Knee To Floor Left: Right: From Medial Instep 45 cm Vascular Assessment Pulses: Dorsalis Pedis Palpable: [Left:Yes] Doppler Audible: [Left:Yes] Blood Pressure: Brachial: [Left:156] Ankle: [Left:Dorsalis Pedis: 180 1.15] Electronic Signature(s) Signed: 07/13/2022 3:54:37 PM By: Carlene Coria RN Entered By: Carlene Coria on 07/13/2022 09:01:29 [place], Shelly Flatten ([id number]) -------------------------------------------------------------------------------- Multi Wound Chart Details Patient Name: William Sawyer Date of Service: 07/13/2022 8:45 AM Medical Record Number: [id number] Patient Account Number: [id number] Date of Birth/Sex: [birth year] (88 y.o. Male) Treating RN: Carlene Coria Primary Care Latoyna Hird: Benita Stabile Other Clinician: Referring Jaydalyn Demattia: Benita Stabile Treating Adonias Demore/Extender: Ricard Dillon Weeks in Treatment: 0 Vital Signs Height(in): 73 Pulse(bpm): 60 Weight(lbs): 229 Blood Pressure(mmHg): 156/97 Body Mass Index(BMI): 30.2 Temperature(F): 98.3 Respiratory Rate(breaths/min): 18 Photos: [N/A:N/A] Wound Location: Right, Anterior Lower Leg Left, Lateral Lower Leg N/A Wounding Event: Other Lesion Other Lesion N/A Primary Etiology: Atypical Atypical N/A Comorbid History: Arrhythmia, Congestive Heart Arrhythmia, Congestive Heart N/A Failure, Hypertension, Type II Failure, Hypertension, Type II Diabetes Diabetes Date Acquired: 05/20/2022 11/19/2021 N/A Weeks of Treatment: 0 0 N/A Wound Status: Open Open N/A Wound Recurrence: No No N/A Measurements L x W x D (cm) 2x1x0.1 4x3.5x0.2 N/A Area (cm) : 1.571 10.996 N/A Volume (cm) : 0.157 2.199 N/A Classification:  Full Thickness Without Exposed Full Thickness Without Exposed N/A Support Structures Support Structures Exudate Amount: Medium Medium N/A Exudate Type: Serosanguineous Serosanguineous N/A Exudate Color: red, brown red, brown N/A Granulation Amount: Large (67-100%) Medium (34-66%) N/A Granulation Quality: Pink, Hyper-granulation Pink, Pale, Hyper-granulation, N/A Friable Necrotic Amount: None Present (0%) Medium (34-66%) N/A Exposed Structures: Fat Layer (Subcutaneous Tissue): Fat Layer (Subcutaneous Tissue): N/A Yes Yes Fascia: No Fascia: No Tendon: No Tendon: No Muscle: No Muscle: No Joint: No Joint: No Bone: No Bone: No Epithelialization: N/A None N/A Treatment Notes Electronic Signature(s) Signed: 07/13/2022 3:54:37 PM By: Carlene Coria RN Entered By: Carlene Coria on 07/13/2022 09:15:33 William Sawyer ([id number]) -------------------------------------------------------------------------------- [place] Details Patient Name: William Sawyer Date of Service: 07/13/2022 8:45 AM Medical Record Number: [id number] Patient Account Number: [id number] Date of Birth/Sex: [birth year] (88 y.o. Male) Treating RN: Carlene Coria  Primary Care Tinley Rought: Benita Stabile Other Clinician: Referring Shereese Bonnie: Benita Stabile Treating Blayden Conwell/Extender: Tito Dine in Treatment: 0 Active Inactive Wound/Skin Impairment Nursing Diagnoses: Knowledge deficit related to ulceration/compromised skin integrity Goals: Patient/caregiver will verbalize understanding of skin care regimen Date Initiated: 07/13/2022 Target Resolution Date: 08/13/2022 Goal Status: Active Ulcer/skin breakdown will have a volume reduction of 30% by week 4 Date Initiated: 07/13/2022 Target Resolution Date: 08/13/2022 Goal Status: Active Ulcer/skin breakdown will have a volume reduction of 50% by week 8 Date Initiated: 07/13/2022 Target Resolution Date: 09/13/2022 Goal Status: Active Ulcer/skin breakdown  will have a volume reduction of 80% by week 12 Date Initiated: 07/13/2022 Target Resolution Date: 10/13/2022 Goal Status: Active Ulcer/skin breakdown will heal within 14 weeks Date Initiated: 07/13/2022 Target Resolution Date: 11/13/2022 Goal Status: Active Interventions: Assess patient/caregiver ability to obtain necessary supplies Assess patient/caregiver ability to perform ulcer/skin care regimen upon admission and as needed Assess ulceration(s) every visit Notes: Electronic Signature(s) Signed: 07/13/2022 3:54:37 PM By: Carlene Coria RN Entered By: Carlene Coria on 07/13/2022 09:15:15 [place], [place] ([id number]) -------------------------------------------------------------------------------- Pain Assessment Details Patient Name: William Sawyer Date of Service: 07/13/2022 8:45 AM Medical Record Number: [id number] Patient Account Number: [id number] Date of Birth/Sex: 1933-02-01 (88 y.o. Male) Treating RN: Carlene Coria Primary Care Latrice Storlie: Benita Stabile Other Clinician: Referring Daion Ginsberg: Benita Stabile Treating Izzabelle Bouley/Extender: Ricard Dillon Weeks in Treatment: 0 Active Problems Location of Pain Severity and Description of Pain Patient Has Paino No Site Locations Pain Management and Medication Current Pain Management: Electronic Signature(s) Signed: 07/13/2022 3:54:37 PM By: Carlene Coria RN Entered By: Carlene Coria on 07/13/2022 08:41:47 William Sawyer ([id number]) -------------------------------------------------------------------------------- Patient/Caregiver Education Details Patient Name: William Sawyer Date of Service: 07/13/2022 8:45 AM Medical Record Number: [id number] Patient Account Number: [id number] Date of Birth/Gender: 11-03-1933 (88 y.o. Male) Treating RN: Carlene Coria Primary Care Physician: Benita Stabile Other Clinician: Referring Physician: Benita Stabile Treating Physician/Extender: Tito Dine in Treatment: 0 Education Assessment Education  Provided To: Patient Education Topics Provided Wound/Skin Impairment: Methods: Explain/Verbal Responses: State content correctly Electronic Signature(s) Signed: 07/13/2022 3:54:37 PM By: Carlene Coria RN Entered By: Carlene Coria on 07/13/2022 [place], [place] ([id number]) -------------------------------------------------------------------------------- Wound Assessment Details Patient Name: William Sawyer Date of Service: 07/13/2022 8:45 AM Medical Record Number: [id number] Patient Account Number: [id number] Date of Birth/Sex: 12-15-1933 (88 y.o. Male) Treating RN: Carlene Coria Primary Care Engelbert Sevin: Benita Stabile Other Clinician: Referring Jacquese Hackman: Benita Stabile Treating Laci Frenkel/Extender: Ricard Dillon Weeks in Treatment: 0 Wound Status Wound Number: 1 Primary Atypical Etiology: Wound Location: Right, Anterior Lower Leg Wound Status: Open Wounding Event: Other Lesion Comorbid Arrhythmia, Congestive Heart Failure, Hypertension, Date Acquired: 05/20/2022 History: Type II Diabetes Weeks Of Treatment: 0 Clustered Wound: No Photos Wound Measurements Length: (cm) 2 Width: (cm) 1 Depth: (cm) 0.1 Area: (cm) 1.571 Volume: (cm) 0.157 % Reduction in Area: % Reduction in Volume: Tunneling: No Undermining: No Wound Description Classification: Full Thickness Without Exposed Support Structures Exudate Amount: Medium Exudate Type: Serosanguineous Exudate Color: red, brown Foul Odor After Cleansing: No Slough/Fibrino No Wound Bed Granulation Amount: Large (67-100%) Exposed Structure Granulation Quality: Pink, Hyper-granulation Fascia Exposed: No Necrotic Amount: None Present (0%) Fat Layer (Subcutaneous Tissue) Exposed: Yes Tendon Exposed: No Muscle Exposed: No Joint Exposed: No Bone Exposed: No Treatment Notes Wound #1 (Lower Leg) Wound Laterality: Right, Anterior Cleanser Byram Ancillary Kit - 15 Day Supply Discharge Instruction: Use supplies as instructed; Kit  contains: (15) Saline Bullets; (15) 3x3 Gauze; 15 pr Gloves Soap and Water Discharge Instruction: Gently cleanse  wound with antibacterial soap, rinse and pat dry prior to dressing wounds [place], [place] ([id number]) Wound Cleanser Discharge Instruction: Wash your hands with soap and water. Remove old dressing, discard into plastic bag and place into trash. Cleanse the wound with Wound Cleanser prior to applying a clean dressing using gauze sponges, not tissues or cotton balls. Do not scrub or use excessive force. Pat dry using gauze sponges, not tissue or cotton balls. Peri-Wound Care Topical Primary Dressing Silvercel 4 1/4x 4 1/4 (in/in) Discharge Instruction: Apply Silvercel 4 1/4x 4 1/4 (in/in) as instructed Secondary Dressing (SILICONE BORDER) Zetuvit Plus SILICONE BORDER Dressing 4x4 (in/in) Discharge Instruction: Please do not put silicone bordered dressings under wraps. Use non-bordered dressing only. Secured With Compression Wrap Compression Stockings Environmental education officer) Signed: 07/13/2022 3:54:37 PM By: Carlene Coria RN Entered By: Carlene Coria on 07/13/2022 08:59:07 William Sawyer ([id number]) -------------------------------------------------------------------------------- Wound Assessment Details Patient Name: William Sawyer Date of Service: 07/13/2022 8:45 AM Medical Record Number: [id number] Patient Account Number: [id number] Date of Birth/Sex: [birth year] (88 y.o. Male) Treating RN: Carlene Coria Primary Care Donovan Gatchel: Benita Stabile Other Clinician: Referring Vandana Haman: Benita Stabile Treating Bronson Bressman/Extender: Ricard Dillon Weeks in Treatment: 0 Wound Status Wound Number: 2 Primary Atypical Etiology: Wound Location: Left, Lateral Lower Leg Wound Status: Open Wounding Event: Other Lesion Comorbid Arrhythmia, Congestive Heart Failure, Hypertension, Date Acquired: 11/19/2021 History: Type II Diabetes Weeks Of Treatment: 0 Clustered Wound: No Photos Wound  Measurements Length: (cm) 4 Width: (cm) 3.5 Depth: (cm) 0.2 Area: (cm) 10.996 Volume: (cm) 2.199 % Reduction in Area: % Reduction in Volume: Epithelialization: None Tunneling: No Undermining: No Wound Description Classification: Full Thickness Without Exposed Support Structures Exudate Amount: Medium Exudate Type: Serosanguineous Exudate Color: red, brown Foul Odor After Cleansing: No Slough/Fibrino Yes Wound Bed Granulation Amount: Medium (34-66%) Exposed Structure Granulation Quality: Pink, Pale, Hyper-granulation, Friable Fascia Exposed: No Necrotic Amount: Medium (34-66%) Fat Layer (Subcutaneous Tissue) Exposed: Yes Necrotic Quality: Adherent Slough Tendon Exposed: No Muscle Exposed: No Joint Exposed: No Bone Exposed: No Treatment Notes Wound #2 (Lower Leg) Wound Laterality: Left, Lateral Cleanser Byram Ancillary Kit - 15 Day Supply Discharge Instruction: Use supplies as instructed; Kit contains: (15) Saline Bullets; (15) 3x3 Gauze; 15 pr Gloves Soap and Water Discharge Instruction: Gently cleanse wound with antibacterial soap, rinse and pat dry prior to dressing wounds [place], [place] ([id number]) Wound Cleanser Discharge Instruction: Wash your hands with soap and water. Remove old dressing, discard into plastic bag and place into trash. Cleanse the wound with Wound Cleanser prior to applying a clean dressing using gauze sponges, not tissues or cotton balls. Do not scrub or use excessive force. Pat dry using gauze sponges, not tissue or cotton balls. Peri-Wound Care Topical Primary Dressing Silvercel 4 1/4x 4 1/4 (in/in) Discharge Instruction: Apply Silvercel 4 1/4x 4 1/4 (in/in) as instructed Secondary Dressing (NON-BORDER) Zetuvit Plus Silicone NON-BORDER 5x5 (in/in) Discharge Instruction: Please do not put silicone bordered dressings under wraps. Use non-bordered dressing only under wraps. Secured With Compression Wrap Compression  Stockings Environmental education officer) Signed: 07/13/2022 3:54:37 PM By: Carlene Coria RN Entered By: Carlene Coria on 07/13/2022 09:00:38 William Sawyer ([id number]) -------------------------------------------------------------------------------- Vitals Details Patient Name: William Sawyer Date of Service: 07/13/2022 8:45 AM Medical Record Number: [id number] Patient Account Number: [id number] Date of Birth/Sex: 14-Apr-1933 (88 y.o. Male) Treating RN: Carlene Coria Primary Care Albertha Beattie: Benita Stabile Other Clinician: Referring Dennisha Mouser: Benita Stabile Treating Adelaide Pfefferkorn/Extender: Tito Dine in Treatment: 0 Vital Signs Time Taken: 08:42 Temperature (F): 98.3 Height (in): 73  Pulse (bpm): 60 Source: Stated Respiratory Rate (breaths/min): 18 Weight (lbs): 229 Blood Pressure (mmHg): 156/97 Source: Stated Reference Range: 80 - 120 mg / dl Body Mass Index (BMI): 30.2 Electronic Signature(s) Signed: 07/13/2022 3:54:37 PM By: Carlene Coria RN Entered By: Carlene Coria on 07/13/2022 08:42:57

## 2022-07-14 LAB — SURGICAL PATHOLOGY

## 2022-07-20 ENCOUNTER — Encounter: Payer: Medicare Other | Attending: Physician Assistant | Admitting: Physician Assistant

## 2022-07-20 DIAGNOSIS — Z952 Presence of prosthetic heart valve: Secondary | ICD-10-CM | POA: Diagnosis not present

## 2022-07-20 DIAGNOSIS — I87311 Chronic venous hypertension (idiopathic) with ulcer of right lower extremity: Secondary | ICD-10-CM | POA: Diagnosis not present

## 2022-07-20 DIAGNOSIS — I4891 Unspecified atrial fibrillation: Secondary | ICD-10-CM | POA: Insufficient documentation

## 2022-07-20 DIAGNOSIS — E11622 Type 2 diabetes mellitus with other skin ulcer: Secondary | ICD-10-CM | POA: Diagnosis not present

## 2022-07-20 DIAGNOSIS — Z7901 Long term (current) use of anticoagulants: Secondary | ICD-10-CM | POA: Diagnosis not present

## 2022-07-20 DIAGNOSIS — I110 Hypertensive heart disease with heart failure: Secondary | ICD-10-CM | POA: Insufficient documentation

## 2022-07-20 DIAGNOSIS — I5032 Chronic diastolic (congestive) heart failure: Secondary | ICD-10-CM | POA: Insufficient documentation

## 2022-07-20 DIAGNOSIS — C44722 Squamous cell carcinoma of skin of right lower limb, including hip: Secondary | ICD-10-CM | POA: Diagnosis not present

## 2022-07-20 DIAGNOSIS — L97818 Non-pressure chronic ulcer of other part of right lower leg with other specified severity: Secondary | ICD-10-CM | POA: Diagnosis not present

## 2022-07-20 NOTE — Progress Notes (Addendum)
William Sawyer, William Sawyer ([id number]) Visit Report for 07/20/2022 Chief Complaint Document Details Patient Name: William Sawyer, William Sawyer Date of Service: 07/20/2022 10:00 AM Medical Record Number: [id number] Patient Account Number: [id number] Date of Birth/Sex: 08-May-1933 (88 y.o. M) Treating RN: Levora Dredge Primary Care Provider: Benita Stabile Other Clinician: Referring Provider: Benita Stabile Treating Provider/Extender: Skipper Cliche in Treatment: 1 Information Obtained from: Patient Chief Complaint 7/25; patient is here for review of 2 open areas on the right lower leg Electronic Signature(s) Signed: 07/20/2022 10:15:10 AM By: Worthy Keeler PA-C Entered By: Worthy Keeler on 07/20/2022 10:15:10 William Sawyer ([id number]) -------------------------------------------------------------------------------- Debridement Details Patient Name: William Sawyer Date of Service: 07/20/2022 10:00 AM Medical Record Number: [id number] Patient Account Number: [id number] Date of Birth/Sex: 18-Jun-1933 (88 y.o. M) Treating RN: Levora Dredge Primary Care Provider: Benita Stabile Other Clinician: Referring Provider: Benita Stabile Treating Provider/Extender: Skipper Cliche in Treatment: 1 Debridement Performed for Wound #2 Left,Lateral Lower Leg Assessment: Performed By: Physician Tommie Sams., PA-C Debridement Type: Chemical/Enzymatic/Mechanical Agent Used: saline gauze Level of Consciousness (Pre- Awake and Alert procedure): Pre-procedure Verification/Time Out Yes - 10:45 Taken: Instrument: Other : saline gauze Bleeding: None Response to Treatment: Procedure was tolerated well Level of Consciousness (Post- Awake and Alert procedure): Post Debridement Measurements of Total Wound Length: (cm) 3.5 Width: (cm) 3.3 Depth: (cm) 0.2 Volume: (cm) 1.814 Character of Wound/Ulcer Post Debridement: Stable Post Procedure Diagnosis Same as Pre-procedure Electronic Signature(s) Signed: 07/20/2022 4:30:35 PM By:  Levora Dredge Signed: 07/22/2022 6:41:29 PM By: Worthy Keeler PA-C Entered By: Levora Dredge on 07/20/2022 10:53:08 William Sawyer ([id number]) -------------------------------------------------------------------------------- Debridement Details Patient Name: William Sawyer Date of Service: 07/20/2022 10:00 AM Medical Record Number: [id number] Patient Account Number: [id number] Date of Birth/Sex: [birth year] (88 y.o. M) Treating RN: Levora Dredge Primary Care Provider: Benita Stabile Other Clinician: Referring Provider: Benita Stabile Treating Provider/Extender: Skipper Cliche in Treatment: 1 Debridement Performed for Wound #1 Right,Anterior Lower Leg Assessment: Performed By: Physician Tommie Sams., PA-C Debridement Type: Chemical/Enzymatic/Mechanical Agent Used: saline gauze Level of Consciousness (Pre- Awake and Alert procedure): Pre-procedure Verification/Time Out Yes - 10:45 Taken: Instrument: Other : saline gauze Bleeding: None Response to Treatment: Procedure was tolerated well Level of Consciousness (Post- Awake and Alert procedure): Post Debridement Measurements of Total Wound Length: (cm) 1 Width: (cm) 0.5 Depth: (cm) 0.1 Volume: (cm) 0.039 Character of Wound/Ulcer Post Debridement: Stable Post Procedure Diagnosis Same as Pre-procedure Electronic Signature(s) Signed: 07/20/2022 4:30:35 PM By: Levora Dredge Signed: 07/22/2022 6:41:29 PM By: Worthy Keeler PA-C Entered By: Levora Dredge on 07/20/2022 10:53:43 William Sawyer ([id number]) -------------------------------------------------------------------------------- HPI Details Patient Name: William Sawyer Date of Service: 07/20/2022 10:00 AM Medical Record Number: [id number] Patient Account Number: [id number] Date of Birth/Sex: Feb 21, 1933 (88 y.o. M) Treating RN: Levora Dredge Primary Care Provider: Benita Stabile Other Clinician: Referring Provider: Benita Stabile Treating Provider/Extender: Skipper Cliche in Treatment: 1 History of Present Illness HPI Description: ADMISSION 07/14/2023 This is a 88 year old man who lives independently. He is here for review of 2 wounds on the right leg 1 laterally and one anterior. The major area seems to be laterally where he has had a progressively enlarging wound for about 6 months. He is not really specifically addressing this. He tells me he has a history of squamous cell carcinomas but they are on his arms and his forehead not his legs. He also has a smaller scabbed area on the anterior upper tibia. He says he gets these every now and then and they fall off  and heal. He says the current one has been there for about a month. 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 He is on Xarelto for prophylaxis with his A-fib but tells me he has not taken this in 2 days out of anticipation that I might have to biopsy the area on the right lower leg ABI on the left was 1.15 07-20-2022 upon evaluation today patient continues to have issues with his wounds which are still open at this point at both locations. We did get the pathology back and this is a squamous cell carcinoma which needs to be addressed surgically. That was discussed with the patient today. With that being said I do believe that he needs to be seen at the skin surgery center ASAP. He tells me he has not heard from them yet and again were going to follow-up on that I did give him the number as well to give them a call as I fear he may have missed their call. Electronic Signature(s) Signed: 07/22/2022 6:07:07 PM By: Worthy Keeler PA-C Entered By: Worthy Keeler on 07/22/2022 18:07:07 William Sawyer, William Sawyer ([id number]) -------------------------------------------------------------------------------- Physical Exam Details Patient Name:  William Sawyer Date of Service: 07/20/2022 10:00 AM Medical Record Number: [id number] Patient Account Number: [id number] Date of Birth/Sex: 08-10-1933 (88 y.o. M) Treating RN: Levora Dredge Primary Care Provider: Benita Stabile Other Clinician: Referring Provider: Benita Stabile Treating Provider/Extender: Jeri Cos Weeks in Treatment: 1 Constitutional Well-nourished and well-hydrated in no acute distress. Respiratory normal breathing without difficulty. Psychiatric this patient is able to make decisions and demonstrates good insight into disease process. Alert and Oriented x 3. pleasant and cooperative. Notes Patient's wound bed again is not going require any debridement as this is a cancer lesion and again that is not something that obviously we would debride at all. I do believe that he needs to be seen as quickly as possible by the skin surgery center in order to make sure that we get this under control here. He voiced understanding and he tells me that he is just waiting for them to give him a call to get scheduled. Electronic Signature(s) Signed: 07/22/2022 6:08:01 PM By: Worthy Keeler PA-C Entered By: Worthy Keeler on 07/22/2022 18:08:01 William Sawyer, William Sawyer ([id number]) -------------------------------------------------------------------------------- Physician Orders Details Patient Name: William Sawyer Date of Service: 07/20/2022 10:00 AM Medical Record Number: [id number] Patient Account Number: [id number] Date of Birth/Sex: Jul 02, 1933 (88 y.o. M) Treating RN: Levora Dredge Primary Care Provider: Benita Stabile Other Clinician: Referring Provider: Benita Stabile Treating Provider/Extender: Skipper Cliche in Treatment: 1 Verbal / Phone Orders: No Diagnosis Coding ICD-10 Coding Code Description [phone] Non-pressure chronic ulcer of other part of right lower leg with other specified severity C44.722 Squamous cell carcinoma of skin of right lower limb, including hip I87.311 Chronic  venous hypertension (idiopathic) with ulcer of right lower extremity E11.622 Type 2 diabetes mellitus with other skin ulcer Follow-up Appointments o Return Appointment in 3 weeks. Bathing/ Shower/ Hygiene o May shower with wound dressing protected with water repellent cover or cast protector. o No tub bath. Edema Control - Lymphedema / Segmental Compressive Device / Other o Elevate, Exercise Daily and Avoid Standing for Long Periods of Time. o Elevate legs to the level of the heart and pump ankles as often as possible o Elevate leg(s) parallel to the floor when sitting. Wound Treatment Wound #1 -  Lower Leg Wound Laterality: Right, Anterior Cleanser: Byram Ancillary Kit - 15 Day Supply (Generic) Every Other Day/30 Days Discharge Instructions: Use supplies as instructed; Kit contains: (15) Saline Bullets; (15) 3x3 Gauze; 15 pr Gloves Cleanser: Soap and Water Every Other Day/30 Days Discharge Instructions: Gently cleanse wound with antibacterial soap, rinse and pat dry prior to dressing wounds Cleanser: Wound Cleanser Every Other Day/30 Days Discharge Instructions: Wash your hands with soap and water. Remove old dressing, discard into plastic bag and place into trash. Cleanse the wound with Wound Cleanser prior to applying a clean dressing using gauze sponges, not tissues or cotton balls. Do not scrub or use excessive force. Pat dry using gauze sponges, not tissue or cotton balls. Primary Dressing: Silvercel Small 2x2 (in/in) (DME) (Generic) Every Other Day/30 Days Discharge Instructions: Apply Silvercel Small 2x2 (in/in) as instructed Secondary Dressing: [place] Dressing, 4x4 (in/in) Every Other Day/30 Days Discharge Instructions: Apply over dressing to secure in place. Wound #2 - Lower Leg Wound Laterality: Right, Lateral Cleanser: Byram Ancillary Kit - 15 Day Supply (Generic) Every Other Day/30 Days Discharge Instructions: Use supplies as instructed; Kit contains:  (15) Saline Bullets; (15) 3x3 Gauze; 15 pr Gloves Cleanser: Soap and Water Every Other Day/30 Days Discharge Instructions: Gently cleanse wound with antibacterial soap, rinse and pat dry prior to dressing wounds Cleanser: Wound Cleanser Every Other Day/30 Days Discharge Instructions: Wash your hands with soap and water. Remove old dressing, discard into plastic bag and place into trash. Cleanse the wound with Wound Cleanser prior to applying a clean dressing using gauze sponges, not tissues or cotton balls. Do not scrub or use excessive force. Pat dry using gauze sponges, not tissue or cotton balls. Primary Dressing: Silvercel Small 2x2 (in/in) (DME) (Generic) Every Other Day/30 Days Discharge Instructions: Apply Silvercel Small 2x2 (in/in) as instructed Secondary Dressing: [place] Dressing, 4x4 (in/in) Every Other Day/30 Days William Sawyer, William Sawyer ([id number]) Discharge Instructions: Apply over dressing to secure in place. Electronic Signature(s) Signed: 07/20/2022 4:30:35 PM By: Levora Dredge Signed: 07/22/2022 6:41:29 PM By: Worthy Keeler PA-C Entered By: Levora Dredge on 07/20/2022 10:54:36 William Sawyer ([id number]) -------------------------------------------------------------------------------- Problem List Details Patient Name: William Sawyer Date of Service: 07/20/2022 10:00 AM Medical Record Number: [id number] Patient Account Number: [id number] Date of Birth/Sex: Jun 22, 1933 (88 y.o. M) Treating RN: Levora Dredge Primary Care Provider: Benita Stabile Other Clinician: Referring Provider: Benita Stabile Treating Provider/Extender: Skipper Cliche in Treatment: 1 Active Problems ICD-10 Encounter Code Description Active Date MDM Diagnosis L97.818 Non-pressure chronic ulcer of other part of right lower leg with other 07/13/2022 No Yes specified severity C44.722 Squamous cell carcinoma of skin of right lower limb, including hip 07/13/2022 No Yes I87.311 Chronic venous  hypertension (idiopathic) with ulcer of right lower 07/13/2022 No Yes extremity E11.622 Type 2 diabetes mellitus with other skin ulcer 07/13/2022 No Yes Inactive Problems Resolved Problems Electronic Signature(s) Signed: 07/20/2022 10:15:07 AM By: Worthy Keeler PA-C Entered By: Worthy Keeler on 07/20/2022 10:15:07 William Sawyer ([id number]) -------------------------------------------------------------------------------- Progress Note Details Patient Name: William Sawyer Date of Service: 07/20/2022 10:00 AM Medical Record Number: [id number] Patient Account Number: [id number] Date of Birth/Sex: 12-Jun-1933 (88 y.o. M) Treating RN: Levora Dredge Primary Care Provider: Benita Stabile Other Clinician: Referring Provider: Benita Stabile Treating Provider/Extender: Skipper Cliche in Treatment: 1 Subjective Chief Complaint Information obtained from Patient 7/25; patient is here for review of 2 open areas on the right lower leg History of Present Illness (HPI) ADMISSION 07/14/2023 This is a 88 year old  man who lives independently. He is here for review of 2 wounds on the right leg 1 laterally and one anterior. The major area seems to be laterally where he has had a progressively enlarging wound for about 6 months. He is not really specifically addressing this. He tells me he has a history of squamous cell carcinomas but they are on his arms and his forehead not his legs. He also has a smaller scabbed area on the anterior upper tibia. He says he gets these every now and then and they fall off and heal. He says the current one has been there for about a month. 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 He is on Xarelto for prophylaxis with his A-fib but tells me he has not taken this in 2 days out of anticipation that I might  have to biopsy the area on the right lower leg ABI on the left was 1.15 07-20-2022 upon evaluation today patient continues to have issues with his wounds which are still open at this point at both locations. We did get the pathology back and this is a squamous cell carcinoma which needs to be addressed surgically. That was discussed with the patient today. With that being said I do believe that he needs to be seen at the skin surgery center ASAP. He tells me he has not heard from them yet and again were going to follow-up on that I did give him the number as well to give them a call as I fear he may have missed their call. Objective Constitutional Well-nourished and well-hydrated in no acute distress. Vitals Time Taken: 10:20 AM, Height: 73 in, Weight: 229 lbs, BMI: 30.2, Temperature: 98 F, Pulse: 90 bpm, Respiratory Rate: 18 breaths/min, Blood Pressure: 137/88 mmHg. Respiratory normal breathing without difficulty. Psychiatric this patient is able to make decisions and demonstrates good insight into disease process. Alert and Oriented x 3. pleasant and cooperative. General Notes: Patient's wound bed again is not going require any debridement as this is a cancer lesion and again that is not something that obviously we would debride at all. I do believe that he needs to be seen as quickly as possible by the skin surgery center in order to make sure that we get this under control here. He voiced understanding and he tells me that he is just waiting for them to give him a call to get scheduled. Integumentary (Hair, Skin) Wound #1 status is Open. Original cause of wound was Other Lesion. The date acquired was: 05/20/2022. The wound has been in treatment 1 weeks. The wound is located on the Right,Anterior Lower Leg. The wound measures 1cm length x 0.5cm width x 0.1cm depth; 0.393cm^2 area and 0.039cm^3 volume. There is no tunneling or undermining noted. There is a medium amount of serosanguineous  drainage noted. There is no granulation within the wound bed. There is a large (67-100%) amount of necrotic tissue within the wound bed including Eschar and Adherent Slough. Wound #2 status is Open. Original cause of wound was Other Lesion. The date acquired was: 11/19/2021. The wound has been in treatment 1 weeks. The wound is located on the Right,Lateral Lower Leg. The wound measures 3.5cm length x 3.3cm width x 0.2cm depth; 9.071cm^2 area and 1.814cm^3 volume. There is Fat Layer (Subcutaneous Tissue) exposed. There is no tunneling or undermining noted. There is a medium  [place] ([id number]) amount of serosanguineous drainage noted. There is medium (34-66%) pink, pale, friable, hyper - granulation within the wound bed. There is a medium (34-66%) amount of necrotic tissue within the wound bed including Adherent Slough. Assessment Active Problems ICD-10 Non-pressure chronic ulcer of other part of right lower leg with other specified severity Squamous cell carcinoma of skin of right lower limb, including hip Chronic venous hypertension (idiopathic) with ulcer of right lower extremity Type 2 diabetes mellitus with other skin ulcer Procedures Wound #1 Pre-procedure diagnosis of Wound #1 is an Atypical located on the Right,Anterior Lower Leg . There was a Chemical/Enzymatic/Mechanical debridement performed by Tommie Sams., PA-C. With the following instrument(s): saline gauze. Other agent used was saline gauze. A time out was conducted at 10:45, prior to the start of the procedure. There was no bleeding. The procedure was tolerated well. Post Debridement Measurements: 1cm length x 0.5cm width x 0.1cm depth; 0.039cm^3 volume. Character of Wound/Ulcer Post Debridement is stable. Post procedure Diagnosis Wound #1: Same as Pre-Procedure Wound #2 Pre-procedure diagnosis of Wound #2 is an Atypical located on the Left,Lateral Lower Leg . There was a Chemical/Enzymatic/Mechanical debridement  performed by Tommie Sams., PA-C. With the following instrument(s): saline gauze. Other agent used was saline gauze. A time out was conducted at 10:45, prior to the start of the procedure. There was no bleeding. The procedure was tolerated well. Post Debridement Measurements: 3.5cm length x 3.3cm width x 0.2cm depth; 1.814cm^3 volume. Character of Wound/Ulcer Post Debridement is stable. Post procedure Diagnosis Wound #2: Same as Pre-Procedure Plan Follow-up Appointments: Return Appointment in 3 weeks. Bathing/ Shower/ Hygiene: May shower with wound dressing protected with water repellent cover or cast protector. No tub bath. Edema Control - Lymphedema / Segmental Compressive Device / Other: Elevate, Exercise Daily and Avoid Standing for Long Periods of Time. Elevate legs to the level of the heart and pump ankles as often as possible Elevate leg(s) parallel to the floor when sitting. WOUND #1: - Lower Leg Wound Laterality: Right, Anterior Cleanser: Byram Ancillary Kit - 15 Day Supply (Generic) Every Other Day/30 Days Discharge Instructions: Use supplies as instructed; Kit contains: (15) Saline Bullets; (15) 3x3 Gauze; 15 pr Gloves Cleanser: Soap and Water Every Other Day/30 Days Discharge Instructions: Gently cleanse wound with antibacterial soap, rinse and pat dry prior to dressing wounds Cleanser: Wound Cleanser Every Other Day/30 Days Discharge Instructions: Wash your hands with soap and water. Remove old dressing, discard into plastic bag and place into trash. Cleanse the wound with Wound Cleanser prior to applying a clean dressing using gauze sponges, not tissues or cotton balls. Do not scrub or use excessive force. Pat dry using gauze sponges, not tissue or cotton balls. Primary Dressing: Silvercel Small 2x2 (in/in) (DME) (Generic) Every Other Day/30 Days Discharge Instructions: Apply Silvercel Small 2x2 (in/in) as instructed Secondary Dressing: [place] Dressing, 4x4  (in/in) Every Other Day/30 Days Discharge Instructions: Apply over dressing to secure in place. WOUND #2: - Lower Leg Wound Laterality: Right, Lateral Cleanser: Byram Ancillary Kit - 15 Day Supply (Generic) Every Other Day/30 Days Discharge Instructions: Use supplies as instructed; Kit contains: (15) Saline Bullets; (15) 3x3 Gauze; 15 pr Gloves Cleanser: Soap and Water Every Other Day/30 Days Discharge Instructions: Gently cleanse wound with antibacterial soap, rinse and pat dry prior to dressing wounds Cleanser: Wound Cleanser Every Other Day/30 Days William Sawyer, William Sawyer ([id number]) Discharge Instructions: Wash your hands with soap and water. Remove old dressing, discard into plastic bag and  place into trash. Cleanse the wound with Wound Cleanser prior to applying a clean dressing using gauze sponges, not tissues or cotton balls. Do not scrub or use excessive force. Pat dry using gauze sponges, not tissue or cotton balls. Primary Dressing: Silvercel Small 2x2 (in/in) (DME) (Generic) Every Other Day/30 Days Discharge Instructions: Apply Silvercel Small 2x2 (in/in) as instructed Secondary Dressing: [place] Dressing, 4x4 (in/in) Every Other Day/30 Days Discharge Instructions: Apply over dressing to secure in place. 1. I would recommend currently that we go ahead and continue with the recommendation for the same dressings currently again were mainly just trying to keep this clean and protected until he gets to surgery that is good to be the primary curative mechanism at this point. Subsequently he is using silver cell currently. 2. Also can recommend that he continue with a [place] dressing to cover. 3. I would also suggest the patient continue to monitor for any signs of worsening or infection. Obviously if anything changes he needs to let us know but the primary goal right now is to get him to the dermatologist for surgical resection of these cancerous areas to primarily get this to  heal. We will see patient back for reevaluation in 1 week here in the clinic. If anything worsens or changes patient will contact our office for additional recommendations. Electronic Signature(s) Signed: 07/22/2022 6:08:48 PM By: Worthy Keeler PA-C Entered By: Worthy Keeler on 07/22/2022 18:08:47 William Sawyer, William Sawyer ([id number]) -------------------------------------------------------------------------------- SuperBill Details Patient Name: William Sawyer Date of Service: 07/20/2022 Medical Record Number: [id number] Patient Account Number: [id number] Date of Birth/Sex: December 06, 1933 (88 y.o. M) Treating RN: Levora Dredge Primary Care Provider: Benita Stabile Other Clinician: Referring Provider: Benita Stabile Treating Provider/Extender: Skipper Cliche in Treatment: 1 Diagnosis Coding ICD-10 Codes Code Description [phone] Non-pressure chronic ulcer of other part of right lower leg with other specified severity C44.722 Squamous cell carcinoma of skin of right lower limb, including hip I87.311 Chronic venous hypertension (idiopathic) with ulcer of right lower extremity E11.622 Type 2 diabetes mellitus with other skin ulcer Facility Procedures CPT4 Code: 83358251 Description: 89842 - WOUND CARE VISIT-LEV 3 EST PT Modifier: Quantity: 1 Physician Procedures CPT4 Code: 1031281 Description: 18867 - WC PHYS LEVEL 3 - EST PT Modifier: Quantity: 1 CPT4 Code: Description: ICD-10 Diagnosis Description L97.818 Non-pressure chronic ulcer of other part of right lower leg with other spec C44.722 Squamous cell carcinoma of skin of right lower limb, including hip I87.311 Chronic venous hypertension (idiopathic) with  ulcer of right lower extremit E11.622 Type 2 diabetes mellitus with other skin ulcer Modifier: ified severity y Quantity: Electronic Signature(s) Signed: 07/22/2022 6:09:17 PM By: Worthy Keeler PA-C Previous Signature: 07/20/2022 3:16:45 PM Version By: Levora Dredge Previous Signature:  07/20/2022 3:16:35 PM Version By: Levora Dredge Entered By: Worthy Keeler on 07/22/2022 18:09:16

## 2022-07-20 NOTE — Progress Notes (Addendum)
BEACHER, EVERY ([id number]) Visit Report for 07/20/2022 Arrival Information Details Patient Name: William Sawyer, HESKETT Date of Service: 07/20/2022 10:00 AM Medical Record Number: [id number] Patient Account Number: [id number] Date of Birth/Sex: 10-Jun-1933 (88 y.o. M) Treating RN: Levora Dredge Primary Care Muna Demers: Benita Stabile Other Clinician: Referring Golden Gilreath: Benita Stabile Treating Daizy Outen/Extender: Skipper Cliche in Treatment: 1 Visit Information History Since Last Visit Added or deleted any medications: No Patient Arrived: Wheel Chair Any new allergies or adverse reactions: No Arrival Time: 10:17 Had a fall or experienced change in No Accompanied By: self activities of daily living that may affect Transfer Assistance: EasyPivot Patient Lift risk of falls: Patient Identification Verified: Yes Hospitalized since last visit: No Secondary Verification Process Completed: Yes Has Dressing in Place as Prescribed: Yes Patient Requires Transmission-Based No Pain Present Now: No Precautions: Patient Has Alerts: Yes Patient Alerts: Patient on Blood Thinner DIABETIC Electronic Signature(s) Signed: 07/20/2022 4:30:35 PM By: Levora Dredge Entered By: Levora Dredge on 07/20/2022 10:20:51 William Sawyer ([id number]) -------------------------------------------------------------------------------- Clinic Level of Care Assessment Details Patient Name: William Sawyer Date of Service: 07/20/2022 10:00 AM Medical Record Number: [id number] Patient Account Number: [id number] Date of Birth/Sex: July 30, 1933 (88 y.o. M) Treating RN: Levora Dredge Primary Care Rylan Kaufmann: Benita Stabile Other Clinician: Referring Asriel Westrup: Benita Stabile Treating Vonnie Spagnolo/Extender: Skipper Cliche in Treatment: 1 Clinic Level of Care Assessment Items TOOL 4 Quantity Score _0  - Use when only an EandM is performed on FOLLOW-UP visit 0 ASSESSMENTS - Nursing Assessment / Reassessment X - Reassessment of Co-morbidities  (includes updates in patient status) 1 10 X- 1 5 Reassessment of Adherence to Treatment Plan ASSESSMENTS - Wound and Skin Assessment / Reassessment _1  - Simple Wound Assessment / Reassessment - one wound 0 X- 2 5 Complex Wound Assessment / Reassessment - multiple wounds _2  - 0 Dermatologic / Skin Assessment (not related to wound area) ASSESSMENTS - Focused Assessment X - Circumferential Edema Measurements - multi extremities 1 5 _3  - 0 Nutritional Assessment / Counseling / Intervention _4  - 0 Lower Extremity Assessment (monofilament, tuning fork, pulses) _5  - 0 Peripheral Arterial Disease Assessment (using hand held doppler) ASSESSMENTS - Ostomy and/or Continence Assessment and Care _6  - Incontinence Assessment and Management 0 _7  - 0 Ostomy Care Assessment and Management (repouching, etc.) PROCESS - Coordination of Care X - Simple Patient / Family Education for ongoing care 1 15 _8  - 0 Complex (extensive) Patient / Family Education for ongoing care _9  - 0 Staff obtains Programmer, systems, Records, Test Results / Process Orders _10  - 0 Staff telephones HHA, Nursing Homes / Clarify orders / etc _11  - 0 Routine Transfer to another Facility (non-emergent condition) _12  - 0 Routine Hospital Admission (non-emergent condition) _13  - 0 New Admissions / Biomedical engineer / Ordering NPWT, Apligraf, etc. _14  - 0 Emergency Hospital Admission (emergent condition) X- 1 10 Simple Discharge Coordination _15  - 0 Complex (extensive) Discharge Coordination PROCESS - Special Needs _16  - Pediatric / Minor Patient Management 0 _17  - 0 Isolation Patient Management _18  - 0 Hearing / Language / Visual special needs _19  - 0 Assessment of Community assistance (transportation, D/C planning, etc.) _20  - 0 Additional assistance / Altered mentation _21  - 0 Support Surface(s) Assessment (bed, cushion, seat, etc.) INTERVENTIONS - Wound Cleansing / Measurement Huseby, Calix ([id number]) _22  - 0 Simple Wound  Cleansing - one wound X- 2 5 Complex Wound Cleansing - multiple wounds X- 1 5 Wound Imaging (photographs - any number of wounds) _23  - 0 Wound Tracing (instead of photographs) _24  - 0  Simple Wound Measurement - one wound X- 2 5 Complex Wound Measurement - multiple wounds INTERVENTIONS - Wound Dressings X - Small Wound Dressing one or multiple wounds 2 10 _0  - 0 Medium Wound Dressing one or multiple wounds _1  - 0 Large Wound Dressing one or multiple wounds X- 1 5 Application of Medications - topical <OVFIEPPIRJJOACZY>_6<\/AYTKZSWFUXNATFTD>_3  - 0 Application of Medications - injection INTERVENTIONS - Miscellaneous _3  - External ear exam 0 _4  - 0 Specimen Collection (cultures, biopsies, blood, body fluids, etc.) _5  - 0 Specimen(s) / Culture(s) sent or taken to Lab for analysis _6  - 0 Patient Transfer (multiple staff / Civil Service fast streamer / Similar devices) _7  - 0 Simple Staple / Suture removal (25 or less) _8  - 0 Complex Staple / Suture removal (26 or more) _9  - 0 Hypo / Hyperglycemic Management (close monitor of Blood Glucose) _10  - 0 Ankle / Brachial Index (ABI) - do not check if billed separately X- 1 5 Vital Signs Has the patient been seen at the hospital within the last three years: Yes Total Score: 110 Level Of Care: New/Established - Level 3 Electronic Signature(s) Signed: 07/20/2022 4:30:35 PM By: Levora Dredge Entered By: Levora Dredge on 07/20/2022 15:16:29 William Sawyer ([id number]) -------------------------------------------------------------------------------- Encounter Discharge Information Details Patient Name: William Sawyer Date of Service: 07/20/2022 10:00 AM Medical Record Number: [id number] Patient Account Number: [id number] Date of Birth/Sex: [birth year] (88 y.o. M) Treating RN: Levora Dredge Primary Care Sheehan Stacey: Benita Stabile Other Clinician: Referring Chandra Feger: Benita Stabile Treating Tia Gelb/Extender: Skipper Cliche in Treatment: 1 Encounter Discharge Information Items Post Procedure  Vitals Discharge Condition: Stable Temperature (F): 98 Ambulatory Status: Wheelchair Pulse (bpm): 90 Discharge Destination: Home Respiratory Rate (breaths/min): 18 Transportation: Private Auto Blood Pressure (mmHg): 137/88 Accompanied By: self Schedule Follow-up Appointment: Yes Clinical Summary of Care: Electronic Signature(s) Signed: 07/20/2022 3:18:28 PM By: Levora Dredge Previous Signature: 07/20/2022 3:17:38 PM Version By: Levora Dredge Entered By: Levora Dredge on 07/20/2022 15:18:28 William Sawyer ([id number]) -------------------------------------------------------------------------------- Lower Extremity Assessment Details Patient Name: William Sawyer Date of Service: 07/20/2022 10:00 AM Medical Record Number: [id number] Patient Account Number: [id number] Date of Birth/Sex: 1933-01-28 (88 y.o. M) Treating RN: Levora Dredge Primary Care Kadyn Guild: Benita Stabile Other Clinician: Referring Shanikka Wonders: Benita Stabile Treating Brandon Scarbrough/Extender: Jeri Cos Weeks in Treatment: 1 Edema Assessment Assessed: [Left: No] [Right: No] [Left: Edema] [Right: :] Calf Left: Right: Point of Measurement: 36 cm From Medial Instep 47.5 cm Ankle Left: Right: Point of Measurement: 12 cm From Medial Instep 33.5 cm Vascular Assessment Pulses: Dorsalis Pedis Doppler Audible: [Right:Yes] Posterior Tibial Doppler Audible: [Right:Yes] Electronic Signature(s) Signed: 07/20/2022 4:30:35 PM By: Levora Dredge Entered By: Levora Dredge on 07/20/2022 10:33:59 William Sawyer ([id number]) -------------------------------------------------------------------------------- Multi Wound Chart Details Patient Name: William Sawyer Date of Service: 07/20/2022 10:00 AM Medical Record Number: [id number] Patient Account Number: [id number] Date of Birth/Sex: [birth year] (88 y.o. M) Treating RN: Levora Dredge Primary Care Lorance Pickeral: Benita Stabile Other Clinician: Referring Thayne Cindric: Benita Stabile Treating  Dawana Asper/Extender: Skipper Cliche in Treatment: 1 Vital Signs Height(in): 73 Pulse(bpm): 66 Weight(lbs): 229 Blood Pressure(mmHg): 137/88 Body Mass Index(BMI): 30.2 Temperature(F): 98 Respiratory Rate(breaths/min): 18 Photos: [N/A:N/A] Wound Location: Right, Anterior Lower Leg Left, Lateral Lower Leg N/A Wounding Event: Other Lesion Other Lesion N/A Primary Etiology: Atypical Atypical N/A Comorbid History: Arrhythmia, Congestive Heart Arrhythmia, Congestive Heart N/A Failure, Hypertension, Type II Failure, Hypertension, Type II Diabetes Diabetes Date Acquired: 05/20/2022 11/19/2021 N/A Weeks of Treatment: 1 1 N/A Wound Status: Open Open N/A Wound Recurrence: No No N/A Measurements L x W  x D (cm) 1x0.5x0.1 3.5x3.3x0.2 N/A Area (cm) : 0.393 9.071 N/A Volume (cm) : 0.039 1.814 N/A % Reduction in Area: 75.00% 17.50% N/A % Reduction in Volume: 75.20% 17.50% N/A Classification: Full Thickness Without Exposed Full Thickness Without Exposed N/A Support Structures Support Structures Exudate Amount: Medium Medium N/A Exudate Type: Serosanguineous Serosanguineous N/A Exudate Color: red, brown red, brown N/A Granulation Amount: None Present (0%) Medium (34-66%) N/A Granulation Quality: N/A Pink, Pale, Hyper-granulation, N/A Friable Necrotic Amount: Large (67-100%) Medium (34-66%) N/A Necrotic Tissue: Eschar, Adherent Streator N/A Exposed Structures: Fascia: No Fat Layer (Subcutaneous Tissue): N/A Fat Layer (Subcutaneous Tissue): Yes No Fascia: No Tendon: No Tendon: No Muscle: No Muscle: No Joint: No Joint: No Bone: No Bone: No Epithelialization: None None N/A Treatment Notes Electronic Signature(s) Signed: 07/20/2022 4:30:35 PM By: Levora Dredge Entered By: Levora Dredge on 07/20/2022 10:43:43 ESCHOL, AUXIER ([id number]) KYZEN, HORN ([id number]) -------------------------------------------------------------------------------- Multi-Disciplinary Care  Plan Details Patient Name: William Sawyer Date of Service: 07/20/2022 10:00 AM Medical Record Number: [id number] Patient Account Number: [id number] Date of Birth/Sex: [birth year] (88 y.o. M) Treating RN: Levora Dredge Primary Care Delmon Andrada: Benita Stabile Other Clinician: Referring Oluwasemilore Bahl: Benita Stabile Treating Kayzlee Wirtanen/Extender: Skipper Cliche in Treatment: 1 Active Inactive Wound/Skin Impairment Nursing Diagnoses: Knowledge deficit related to ulceration/compromised skin integrity Goals: Patient/caregiver will verbalize understanding of skin care regimen Date Initiated: 07/13/2022 Target Resolution Date: 08/13/2022 Goal Status: Active Ulcer/skin breakdown will have a volume reduction of 30% by week 4 Date Initiated: 07/13/2022 Target Resolution Date: 08/13/2022 Goal Status: Active Ulcer/skin breakdown will have a volume reduction of 50% by week 8 Date Initiated: 07/13/2022 Target Resolution Date: 09/13/2022 Goal Status: Active Ulcer/skin breakdown will have a volume reduction of 80% by week 12 Date Initiated: 07/13/2022 Target Resolution Date: 10/13/2022 Goal Status: Active Ulcer/skin breakdown will heal within 14 weeks Date Initiated: 07/13/2022 Target Resolution Date: 11/13/2022 Goal Status: Active Interventions: Assess patient/caregiver ability to obtain necessary supplies Assess patient/caregiver ability to perform ulcer/skin care regimen upon admission and as needed Assess ulceration(s) every visit Notes: Electronic Signature(s) Signed: 07/20/2022 4:30:35 PM By: Levora Dredge Entered By: Levora Dredge on 07/20/2022 10:43:32 William Sawyer ([id number]) -------------------------------------------------------------------------------- Pain Assessment Details Patient Name: William Sawyer Date of Service: 07/20/2022 10:00 AM Medical Record Number: [id number] Patient Account Number: [id number] Date of Birth/Sex: 05-18-1933 (88 y.o. M) Treating RN: Levora Dredge Primary Care  Ali Mohl: Benita Stabile Other Clinician: Referring Kiriana Worthington: Benita Stabile Treating Gaylin Bulthuis/Extender: Skipper Cliche in Treatment: 1 Active Problems Location of Pain Severity and Description of Pain Patient Has Paino No Site Locations Rate the pain. Current Pain Level: 0 Pain Management and Medication Current Pain Management: Electronic Signature(s) Signed: 07/20/2022 4:30:35 PM By: Levora Dredge Entered By: Levora Dredge on 07/20/2022 10:23:24 William Sawyer ([id number]) -------------------------------------------------------------------------------- Patient/Caregiver Education Details Patient Name: William Sawyer Date of Service: 07/20/2022 10:00 AM Medical Record Number: [id number] Patient Account Number: [id number] Date of Birth/Gender: 1933-05-09 (88 y.o. M) Treating RN: Levora Dredge Primary Care Physician: Benita Stabile Other Clinician: Referring Physician: Benita Stabile Treating Physician/Extender: Skipper Cliche in Treatment: 1 Education Assessment Education Provided To: Patient Education Topics Provided Wound/Skin Impairment: Handouts: Caring for Your Ulcer Methods: Explain/Verbal Responses: State content correctly Electronic Signature(s) Signed: 07/20/2022 4:30:35 PM By: Levora Dredge Entered By: Levora Dredge on 07/20/2022 15:16:53 William Sawyer ([id number]) -------------------------------------------------------------------------------- Wound Assessment Details Patient Name: William Sawyer Date of Service: 07/20/2022 10:00 AM Medical Record Number: [id number] Patient Account Number: [id number] Date of Birth/Sex: 1933-04-27 (88 y.o. M) Treating RN: Levora Dredge Primary Care Burley Kopka:  Benita Stabile Other Clinician: Referring Capitola Ladson: Benita Stabile Treating Jakyron Fabro/Extender: Jeri Cos Weeks in Treatment: 1 Wound Status Wound Number: 1 Primary Atypical Etiology: Wound Location: Right, Anterior Lower Leg Wound Status: Open Wounding Event: Other  Lesion Comorbid Arrhythmia, Congestive Heart Failure, Hypertension, Date Acquired: 05/20/2022 History: Type II Diabetes Weeks Of Treatment: 1 Clustered Wound: No Photos Wound Measurements Length: (cm) 1 Width: (cm) 0.5 Depth: (cm) 0.1 Area: (cm) 0.393 Volume: (cm) 0.039 % Reduction in Area: 75% % Reduction in Volume: 75.2% Epithelialization: None Tunneling: No Undermining: No Wound Description Classification: Full Thickness Without Exposed Support Structures Exudate Amount: Medium Exudate Type: Serosanguineous Exudate Color: red, brown Foul Odor After Cleansing: No Slough/Fibrino No Wound Bed Granulation Amount: None Present (0%) Exposed Structure Necrotic Amount: Large (67-100%) Fascia Exposed: No Necrotic Quality: Eschar, Adherent Slough Fat Layer (Subcutaneous Tissue) Exposed: No Tendon Exposed: No Muscle Exposed: No Joint Exposed: No Bone Exposed: No Treatment Notes Wound #1 (Lower Leg) Wound Laterality: Right, Anterior Cleanser [place] Ancillary Kit - 15 Day Supply Discharge Instruction: Use supplies as instructed; Kit contains: (15) Saline Bullets; (15) 3x3 Gauze; 15 pr Gloves Soap and Water Discharge Instruction: Gently cleanse wound with antibacterial soap, rinse and pat dry prior to dressing wounds [place], [place] ([id number]) Wound Cleanser Discharge Instruction: Wash your hands with soap and water. Remove old dressing, discard into plastic bag and place into trash. Cleanse the wound with Wound Cleanser prior to applying a clean dressing using gauze sponges, not tissues or cotton balls. Do not scrub or use excessive force. Pat dry using gauze sponges, not tissue or cotton balls. Peri-Wound Care Topical Primary Dressing [place] Small 2x2 (in/in) Discharge Instruction: Apply [place] Small 2x2 (in/in) as instructed Secondary Dressing Telfa Adhesive [place] Dressing, 4x4 (in/in) Discharge Instruction: Apply over dressing to secure in place. Secured  With Compression Wrap Compression Stockings Add-Ons Electronic Signature(s) Signed: 07/20/2022 4:30:35 PM By: Levora Dredge Entered By: Levora Dredge on 07/20/2022 10:32:57 ZYQUAN, CROTTY ([id number]) -------------------------------------------------------------------------------- Wound Assessment Details Patient Name: William Sawyer Date of Service: 07/20/2022 10:00 AM Medical Record Number: [id number] Patient Account Number: [id number] Date of Birth/Sex: August 28, 1933 (88 y.o. M) Treating RN: Levora Dredge Primary Care Gennie Eisinger: Benita Stabile Other Clinician: Referring Iszabella Hebenstreit: Benita Stabile Treating Anaija Wissink/Extender: Jeri Cos Weeks in Treatment: 1 Wound Status Wound Number: 2 Primary Atypical Etiology: Wound Location: Left, Lateral Lower Leg Wound Status: Open Wounding Event: Other Lesion Comorbid Arrhythmia, Congestive Heart Failure, Hypertension, Date Acquired: 11/19/2021 History: Type II Diabetes Weeks Of Treatment: 1 Clustered Wound: No Photos Wound Measurements Length: (cm) 3.5 Width: (cm) 3.3 Depth: (cm) 0.2 Area: (cm) 9.071 Volume: (cm) 1.814 % Reduction in Area: 17.5% % Reduction in Volume: 17.5% Epithelialization: None Tunneling: No Undermining: No Wound Description Classification: Full Thickness Without Exposed Support Structu Exudate Amount: Medium Exudate Type: Serosanguineous Exudate Color: red, brown res Foul Odor After Cleansing: No Slough/Fibrino Yes Wound Bed Granulation Amount: Medium (34-66%) Exposed Structure Granulation Quality: Pink, Pale, Hyper-granulation, Friable Fascia Exposed: No Necrotic Amount: Medium (34-66%) Fat Layer (Subcutaneous Tissue) Exposed: Yes Necrotic Quality: Adherent Slough Tendon Exposed: No Muscle Exposed: No Joint Exposed: No Bone Exposed: No Electronic Signature(s) Signed: 07/20/2022 4:30:35 PM By: Levora Dredge Entered By: Levora Dredge on 07/20/2022 10:33:30 William Sawyer  ([id number]) -------------------------------------------------------------------------------- Vitals Details Patient Name: William Sawyer Date of Service: 07/20/2022 10:00 AM Medical Record Number: [id number] Patient Account Number: [id number] Date of Birth/Sex: [birth year] (88 y.o. M) Treating RN: Levora Dredge Primary Care Amberli Ruegg: Benita Stabile Other Clinician: Referring Dawn Kiper: Benita Stabile Treating Hiral Lukasiewicz/Extender: Skipper Cliche  in Treatment: 1 Vital Signs Time Taken: 10:20 Temperature (F): 98 Height (in): 73 Pulse (bpm): 90 Weight (lbs): 229 Respiratory Rate (breaths/min): 18 Body Mass Index (BMI): 30.2 Blood Pressure (mmHg): 137/88 Reference Range: 80 - 120 mg / dl Electronic Signature(s) Signed: 07/20/2022 4:30:35 PM By: Levora Dredge Entered By: Levora Dredge on 07/20/2022 10:23:15

## 2022-08-10 ENCOUNTER — Ambulatory Visit: Payer: Medicare Other | Admitting: Physician Assistant

## 2022-09-28 NOTE — Progress Notes (Signed)
William Sawyer                                     Cardiology Office Note:    Date:  09/29/2022   ID:  William Sawyer, DOB [birth year], MRN [id number]  PCP:  [name], MD  [facility] Cardiologist: Dr. Sherren Mocha, MD Referring MD: [name], MD   1 year s/p TAVR  History of Present Illness:    William Sawyer is a 88 y.o. male with a hx of HTN, HLD, DM2, chronic afib on Xarelto, chronic diastolic CHF, orthopedic mobility issues (uses motorized scooter) but still functionally independent, and severe AS s/p TAVR (09/29/21) who presents to clinic for follow up.    William Sawyer was hospitalized in 07/2021 with shortness of breath and was found to have acute diastolic heart failure. He was treated with IV Lasix with significant improvement in his symptoms. An echocardiogram performed during his hospitalization was suggestive of significant aortic stenosis. Echo noted to have very poor acoustic windows and difficult image quality. However, the demonstrated a mean gradient of 38 mmHg with a dimensionless index of 0.26, suggestive of at least moderately severe aortic stenosis. He was referred to the structural heart team further evaluation of his aortic stenosis and was seen by Dr. Burt Knack 08/06/21. Cath showed nonobstructive coronary artery disease with moderate mid RCA stenosis, wide patency of the left main, patency of the LAD with mild diffuse plaquing, and patency of the large left circumflex with mild diffuse plaquing.    He ultimately underwent successful TAVR 09/29/21 with Edwards 88m Sapien 3 Ultra THV without complications. POD 1 echo  showed EF 55%, normally functioning TAVR with a mean gradient of 8 mmHg and no PVL. He was resumed on home Xarelto. He had a marked clinical improvement since TAVR. 1 month echo showed EF 60%, normally functioning TAVR with a mean gradient of 7.6 mm hg and trivial PVL as well a small pericardial effusion  localized near the right atrium and right ventricle w/ no evidence of cardiac tamponade. He has had a marked symptomatic improvement since TAVR and improvement of chronic LE edema swelling.    Today the patient presents to clinic for follow up. No CP or SOB. Chronic LE edema that is much better since TAVR. No orthopnea or PND. No dizziness or syncope. No blood in stool or urine. No palpitations. He could tell a difference in how he felt within 15 minutes of having TAVR. He continues to still feel very good- now just wants to find a girlfriend. He is retired pTherapist, occupational He mostly uses a motorized scooter due to orthopedic issues.    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 Cooper/Bartle   Past Surgical History:  Procedure Laterality Date   CATARACT EXTRACTION Bilateral    KNEE SURGERY Right    x5   Left hip replacement     REPLACEMENT TOTAL KNEE BILATERAL Bilateral    RIGHT/LEFT HEART CATH AND CORONARY ANGIOGRAPHY N/A 08/28/2021  Procedure: RIGHT/LEFT HEART CATH AND CORONARY ANGIOGRAPHY;  Surgeon: Sherren Mocha, MD;  Location: [facility];  Service: Cardiovascular;  Laterality: N/A;   TEE WITHOUT CARDIOVERSION N/A 09/29/2021   Procedure: TRANSESOPHAGEAL ECHOCARDIOGRAM (TEE);  Surgeon: Sherren Mocha, MD;  Location: [facility];  Service: Open Heart Surgery;  Laterality: N/A;   TRANSCATHETER AORTIC VALVE REPLACEMENT, TRANSFEMORAL N/A 09/29/2021   Procedure: TRANSCATHETER AORTIC VALVE REPLACEMENT, TRANSFEMORAL;  Surgeon: Sherren Mocha, MD;  Location: [facility];  Service: Open Heart Surgery;  Laterality: N/A;    Current Medications: Current Meds   Medication Sig   allopurinol (ZYLOPRIM) 300 MG tablet Take 300 mg by mouth daily.   amoxicillin (AMOXIL) 500 MG tablet Take 2,000 mg (4 tablets) 1 hour prior to dental procedures.   Ferrous Sulfate (IRON PO) Take 1 tablet by mouth every evening.   furosemide (LASIX) 40 MG tablet Take 1 tablet (40 mg total) by mouth daily.   LANTUS SOLOSTAR 100 UNIT/ML Solostar Pen Inject 0-10 Units into the skin at bedtime as needed (blood sugar over 150).   lisinopril (ZESTRIL) 20 MG tablet Take 1 tablet by mouth daily.   metFORMIN (GLUCOPHAGE) 1000 MG tablet Take 1,000 mg by mouth in the morning and at bedtime.   metoprolol succinate (TOPROL-XL) 100 MG 24 hr tablet Take 1 tablet (100 mg total) by mouth daily. Take with or immediately following a meal.   simvastatin (ZOCOR) 20 MG tablet Take 20 mg by mouth at bedtime.   XARELTO 20 MG TABS tablet Take 20 mg by mouth daily.     Allergies:   Pregabalin, Amlodipine besylate, and Diltiazem hcl   Social History   Socioeconomic History   Marital status: Married    Spouse name: Not on file   Number of children: Not on file   Years of education: Not on file   Highest education level: Not on file  Occupational History   Not on file  Tobacco Use   Smoking status: Never   Smokeless tobacco: Never  Vaping Use   Vaping Use: Never used  Substance and Sexual Activity   Alcohol use: Not Currently   Drug use: Never   Sexual activity: Not on file  Other Topics Concern   Not on file  Social History Narrative   Not on file   Social Determinants of Health   Financial Resource Strain: Not on file  Food Insecurity: Not on file  Transportation Needs: Not on file  Physical Activity: Not on file  Stress: Not on file  Social Connections: Not on file     Family History: The patient's family history includes Breast cancer in his mother; Pancreatic cancer in his father.  ROS:   Please see the history of present illness.    All other systems reviewed and are  negative.  EKGs/Labs/Other Studies Reviewed:    The following studies were reviewed today:   TAVR OPERATIVE NOTE     Date of Procedure:                09/29/2021   Preoperative Diagnosis:      Severe Aortic Stenosis    Postoperative Diagnosis:    Same    Procedure:        Transcatheter Aortic Valve Replacement - Percutaneous  Transfemoral Approach             Edwards Sapien 3 Ultra THV (size 29 mm, model # 9750TFX, serial # [id number])  Co-Surgeons:                        Gaye Pollack, MD, Lenna Sciara, MD, and Sherren Mocha, MD   Anesthesiologist:                  Laurie Panda, MD   Echocardiographer:              Rudean Haskell, MD   Pre-operative Echo Findings: Severe aortic stenosis and mild AI Normal left ventricular systolic function   Post-operative Echo Findings: No paravalvular leak Unchanged/normal left ventricular systolic function _____________  10/01/21 Echocardiogram 09/30/21:  1. The aortic valve has been replaced by a 29 mm Edwards Sapien Valve.  Aortic valve regurgitation is not visualized. Aortic valve mean gradient  measures 8.0 mmHg.   2. Post Procedure peak gradient 13 mm Hg, mean gradient 8 mm Hg, DVI 0.4,  EOA 1.6 cm2.   3. Left ventricular ejection fraction, by estimation, is 55 to 60%. The  left ventricle has normal function. Left ventricular endocardial border  not optimally defined to evaluate regional wall motion. There is mild  concentric left ventricular  hypertrophy. Left ventricular diastolic function could not be evaluated.   4. Right ventricular systolic function is low normal. The right  ventricular size is normal. Tricuspid regurgitation signal is inadequate  for assessing PA pressure.   5. Left atrial size was mildly dilated.   6. The mitral valve was not well visualized. No evidence of mitral valve  regurgitation. Moderate mitral annular calcification.   7. The inferior vena cava is dilated in size with  >50% respiratory  variability, suggesting right atrial pressure of 8 mmHg.    _______________________  Echo 10/29/21 IMPRESSIONS  1. Left ventricular ejection fraction, by estimation, is 60 to 65%. The left ventricle has normal function. The left ventricle has no regional wall motion abnormalities. There is mild concentric left ventricular hypertrophy. Left ventricular diastolic  function could not be evaluated.  2. Right ventricular systolic function is normal. The right ventricular size is normal.  3. Left atrial size was mildly dilated.  4. Right atrial size was mildly dilated.  5. The mitral valve is normal in structure. Mild mitral valve regurgitation. No evidence of mitral stenosis. Moderate mitral annular calcification.  6. The aortic valve has been repaired/replaced. There is a 29 mm Sapien prosthetic, stented (TAVR) valve present in the aortic position.     Trivial perivalvular Aortic valve regurgitation is present. Aortic valve mean gradient measures 7.6 mmHg. Aortic valve Vmax measures 1.87 m/s. DI 0.51.  7. Aortic dilatation noted. There is borderline dilatation of the aortic root, measuring 37 mm.  8. A small pericardial effusion is present. The pericardial effusion is localized near the right atrium and localized near the right ventricle. There is no evidence of cardiac tamponade.  9. Compared to study dated 09/30/2021, the TAVR is stable. The peak/mean transaortic gradients are essentially unchanged and there it trivial perivalvular AI of the TAVR.  __________________________  Echo 09/29/22 IMPRESSIONS   1. Left ventricular ejection fraction, by estimation, is 60 to 65%. The  left ventricle has normal function. The left ventricle has no regional  wall motion abnormalities. There is moderate asymmetric left ventricular  hypertrophy of the basal-septal  segment. Left ventricular diastolic parameters are indeterminate.   2. Right ventricular systolic function is normal. The  right ventricular  size is normal.   3. Left atrial size was moderately dilated.  4. Right atrial size was moderately dilated.   5. The mitral valve is degenerative. Trivial mitral valve regurgitation.  No evidence of mitral stenosis. Severe mitral annular calcification.   6. The aortic valve has been repaired/replaced. Aortic valve  regurgitation is not visualized, no definite paravalvular regurgitation.  No aortic stenosis is present. There is a 29 mm Sapien prosthetic (TAVR)  valve present in the aortic position.  Procedure Date: 09/29/2021. Echo findings are consistent with normal  structure and function of the aortic valve prosthesis. Aortic valve area,  by VTI measures 2.31 cm. Aortic valve mean gradient measures 11.0 mmHg.  Aortic valve Vmax measures 2.05 m/s.  Aortic valve acceleration time measures 67 msec.   7. IVC not well visualized.     EKG:  EKG is  NOT ordered today.   Recent Labs: 09/30/2021: BUN 16; Creatinine, Ser 0.91; Hemoglobin 11.7; Platelets 199; Potassium 3.5; Sodium 129  Recent Lipid Panel    Component Value Date/Time   CHOL 103 07/21/2021 0622   TRIG 38 07/21/2021 0622   HDL 35 (L) 07/21/2021 0622   CHOLHDL 2.9 07/21/2021 0622   VLDL 8 07/21/2021 0622   LDLCALC 60 07/21/2021 0622   Physical Exam:    VS:  BP 90/62   Pulse 88   Ht '6\' 2"'$  (1.88 m)   Wt 205 lb (93 kg)   SpO2 99%   BMI 26.32 kg/m     Wt Readings from Last 3 Encounters:  09/29/22 205 lb (93 kg)  05/26/22 233 lb (105.7 kg)  12/24/21 232 lb (105.2 kg)    General: Well developed, well nourished, NAD Neck: Negative for carotid bruits. No JVD Lungs:Clear to ausculation bilaterally. No wheezes, rales, or rhonchi. Breathing is unlabored. Cardiovascular: Irregularly irregular. No murmurs Extremities: 1+ chronic bilateral edema wearing compression stockings Neuro: Alert and oriented. No focal deficits. No facial asymmetry. MAE spontaneously. Psych: Responds to questions appropriately  with normal affect.    ASSESSMENT/PLAN:    Severe AS s/p TAVR: echo today shows EF 60%, normally functioning TAVR with a mean gradient of 11 mm hg and no PVL. He has NYHA class I. Not very active due to orthopedic issues. The need for SBE was discussed; he has Amoxicillin. Continue on Xarelto.    Permanent Afib: rate well controlled. Continue Xarelto  Chronic diastolic CHF: appears euvolemic. Chronic stable LE edema in compression stockings. Continue lasix '40mg'$  daily. No changes made. Labs followed by PCP   HTN: Bp on soft side today but he has no symptoms. No changes made   HLD: continue simvastatin.   Medication Adjustments/Labs and Tests Ordered: Current medicines are reviewed at length with the patient today.  Concerns regarding medicines are outlined above.  No orders of the defined types were placed in this encounter.   No orders of the defined types were placed in this encounter.    Patient Instructions  Medication Instructions:  Your physician recommends that you continue on your current medications as directed. Please refer to the Current Medication list given to you today. *If you need a refill on your cardiac medications before your next appointment, please call your pharmacy*   Lab Work: None Ordered   Testing/Procedures: None Ordered   Follow-Up: At [facility], you and your health needs are our priority.  As part of our continuing mission to provide you with exceptional heart care, we have created designated Provider Care Teams.  These Care Teams include your primary Cardiologist (physician) and Advanced Practice Providers (APPs -  Physician Assistants and Nurse Practitioners) who all work together to provide you with the care you need, when you need it.  We recommend signing up for the patient portal called "MyChart".  Sign up information is provided on this After Visit Summary.  MyChart is used to connect with patients for Virtual Visits  (Telemedicine).  Patients are able to view lab/test results, encounter notes, upcoming appointments, etc.  Non-urgent messages can be sent to your provider as well.   To learn more about what you can do with MyChart, go to [email or web address].    Your next appointment:   9 month(s)  The format for your next appointment:   In Person  Provider:   Sherren Mocha, MD  Other Instructions   Important Information About Sugar         Signed, William Form, PA-C  09/29/2022 3:00 PM    [place]

## 2022-09-29 ENCOUNTER — Ambulatory Visit (HOSPITAL_COMMUNITY): Payer: Medicare Other | Attending: Physician Assistant | Admitting: Physician Assistant

## 2022-09-29 ENCOUNTER — Ambulatory Visit (HOSPITAL_BASED_OUTPATIENT_CLINIC_OR_DEPARTMENT_OTHER): Payer: Medicare Other

## 2022-09-29 VITALS — BP 90/62 | HR 88 | Ht 74.0 in | Wt 205.0 lb

## 2022-09-29 DIAGNOSIS — I482 Chronic atrial fibrillation, unspecified: Secondary | ICD-10-CM | POA: Insufficient documentation

## 2022-09-29 DIAGNOSIS — I10 Essential (primary) hypertension: Secondary | ICD-10-CM | POA: Diagnosis present

## 2022-09-29 DIAGNOSIS — Z952 Presence of prosthetic heart valve: Secondary | ICD-10-CM

## 2022-09-29 DIAGNOSIS — E782 Mixed hyperlipidemia: Secondary | ICD-10-CM | POA: Diagnosis present

## 2022-09-29 DIAGNOSIS — I5032 Chronic diastolic (congestive) heart failure: Secondary | ICD-10-CM

## 2022-09-29 NOTE — Patient Instructions (Signed)
Medication Instructions:  Your physician recommends that you continue on your current medications as directed. Please refer to the Current Medication list given to you today. *If you need a refill on your cardiac medications before your next appointment, please call your pharmacy*   Lab Work: None Ordered   Testing/Procedures: None Ordered   Follow-Up: At [facility], you and your health needs are our priority.  As part of our continuing mission to provide you with exceptional heart care, we have created designated Provider Care Teams.  These Care Teams include your primary Cardiologist (physician) and Advanced Practice Providers (APPs -  Physician Assistants and Nurse Practitioners) who all work together to provide you with the care you need, when you need it.  We recommend signing up for the patient portal called "MyChart".  Sign up information is provided on this After Visit Summary.  MyChart is used to connect with patients for Virtual Visits (Telemedicine).  Patients are able to view lab/test results, encounter notes, upcoming appointments, etc.  Non-urgent messages can be sent to your provider as well.   To learn more about what you can do with MyChart, go to [email or web address].    Your next appointment:   9 month(s)  The format for your next appointment:   In Person  Provider:   Sherren Mocha, MD  Other Instructions   Important Information About Sugar

## 2022-09-30 LAB — ECHOCARDIOGRAM COMPLETE
AR max vel: 2.18 cm2
AV Area VTI: 2.31 cm2
AV Area mean vel: 2.08 cm2
AV Mean grad: 11 mmHg
AV Peak grad: 16.9 mmHg
Ao pk vel: 2.05 m/s
Area-P 1/2: 2.97 cm2
S' Lateral: 1.8 cm

## 2022-10-08 IMAGING — CR DG CHEST 2V
1 series · 2 of 2 positions shown · non-contrast
Comparison: None.

CLINICAL DATA: Shortness of breath

EXAM:
CHEST - 2 VIEW

[Series 1: dg chest 2 view · 0.14mm/px · 2 of 2 slices shown]
[im 1/2]
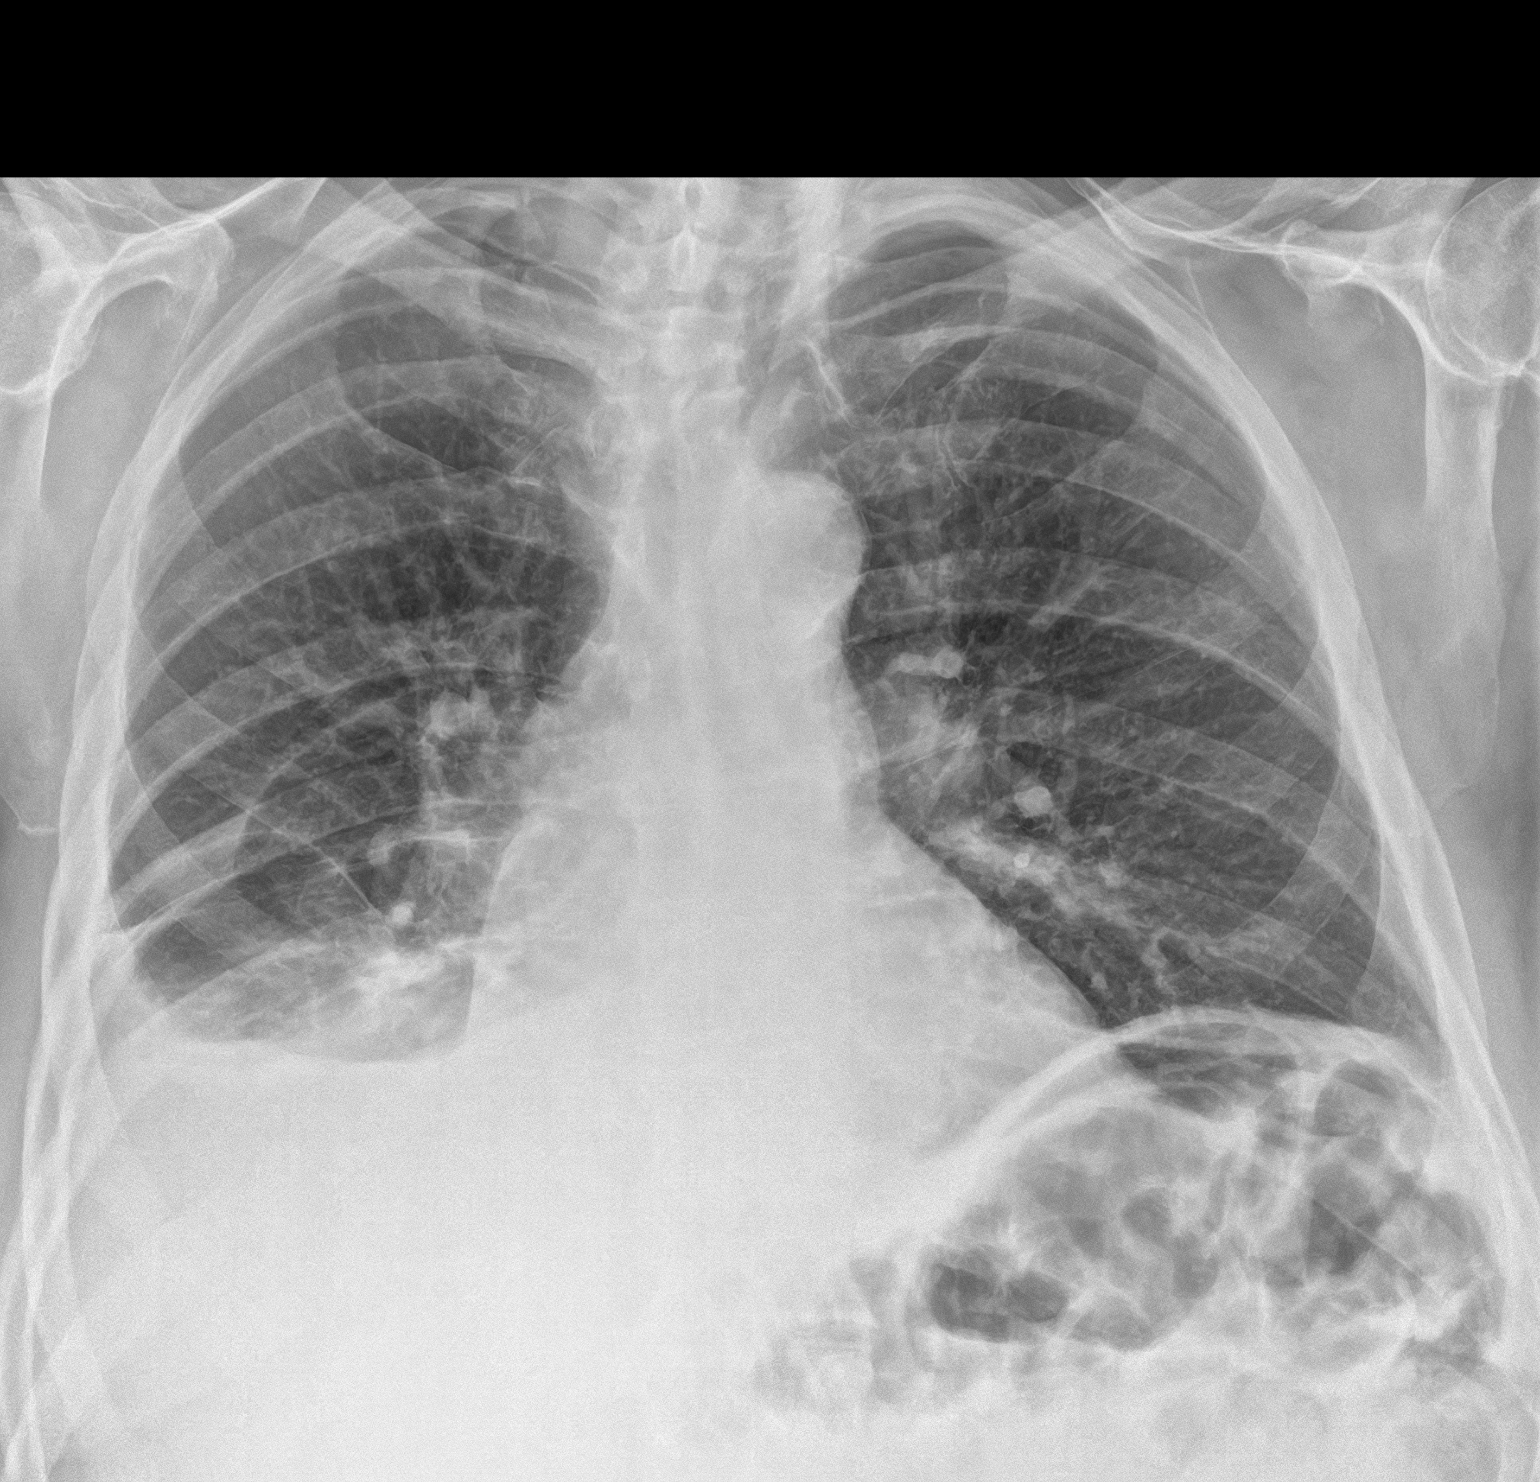
[im 2/2]
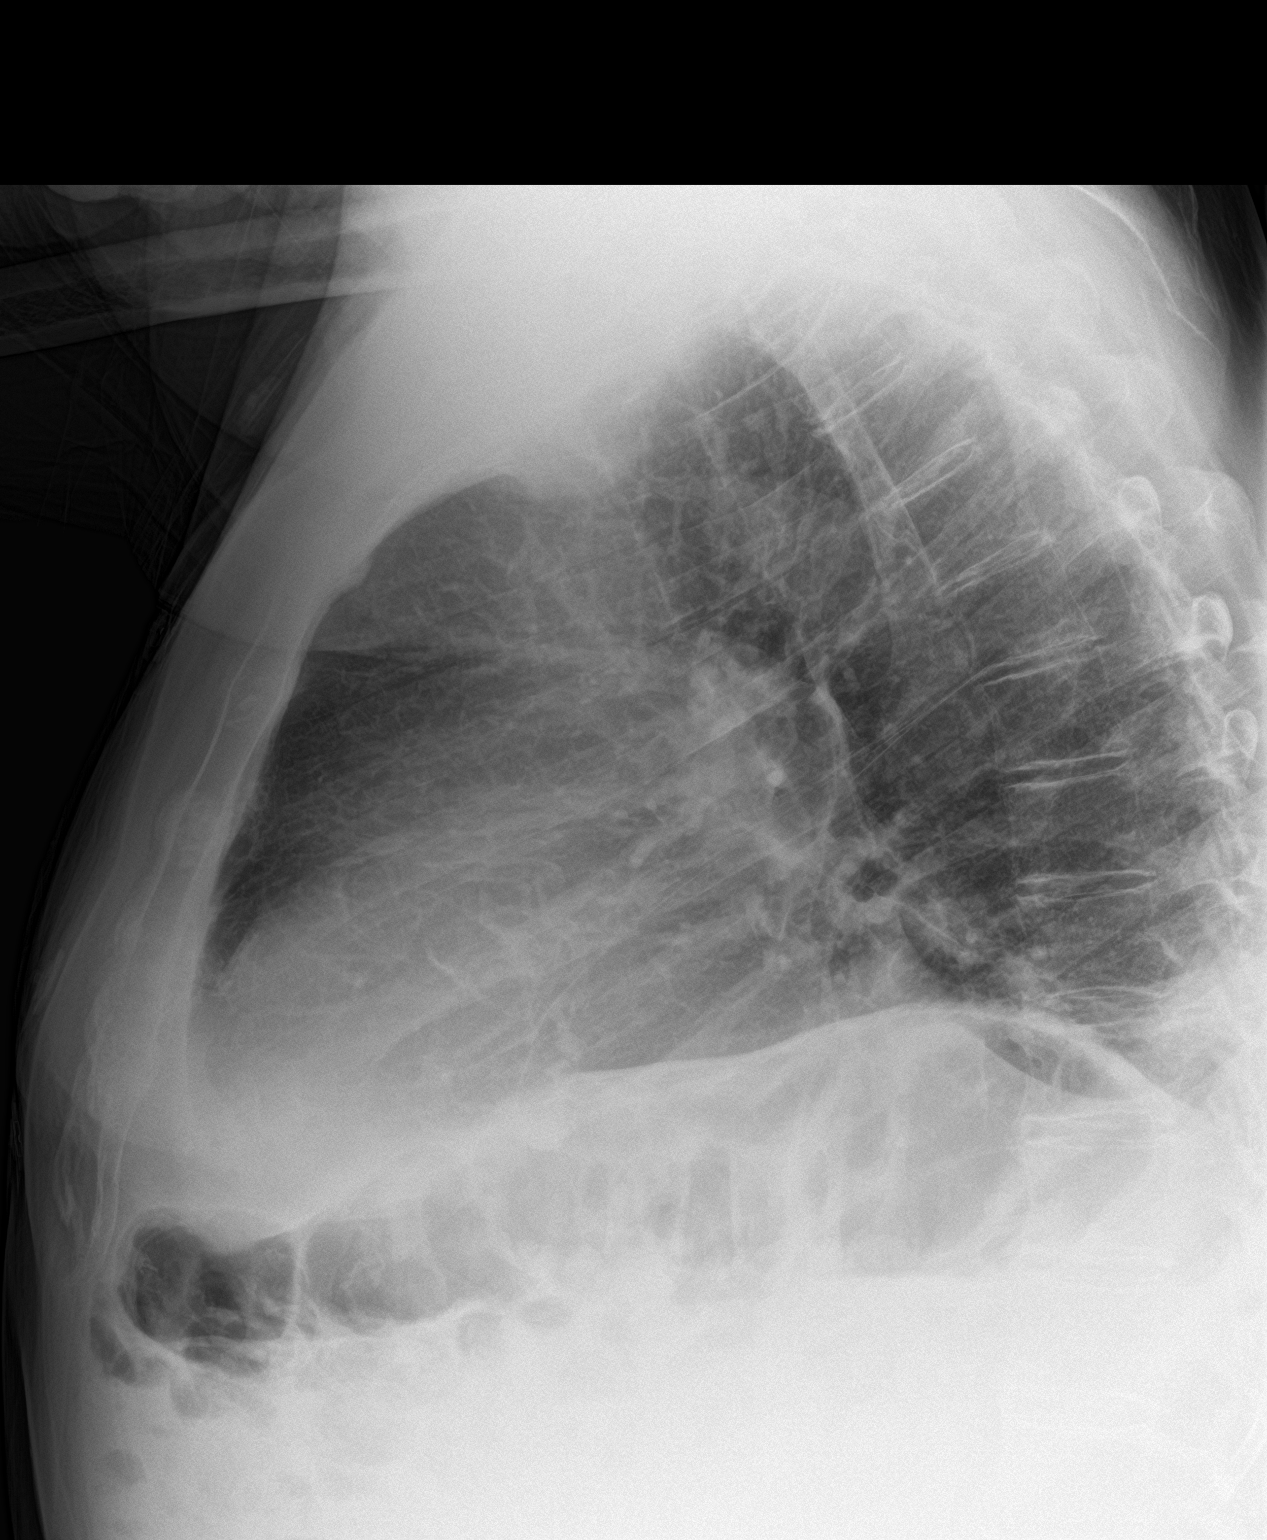

[2 of 2 positions shown; findings below may reference images not displayed]

FINDINGS: Heart is normal size. Airspace disease in the right lower lobe with
small right pleural effusion. Left lung clear. No acute bony
abnormality.
IMPRESSION: Right lower lobe airspace disease concerning for pneumonia. Small
right effusion.

## 2022-10-13 IMAGING — CR DG CHEST 2V
2 series · 2 of 2 positions shown · non-contrast
Comparison: March 20, 21

CLINICAL DATA: Shortness of breath.  Chest tightness.

EXAM:
CHEST - 2 VIEW

[chest pa]
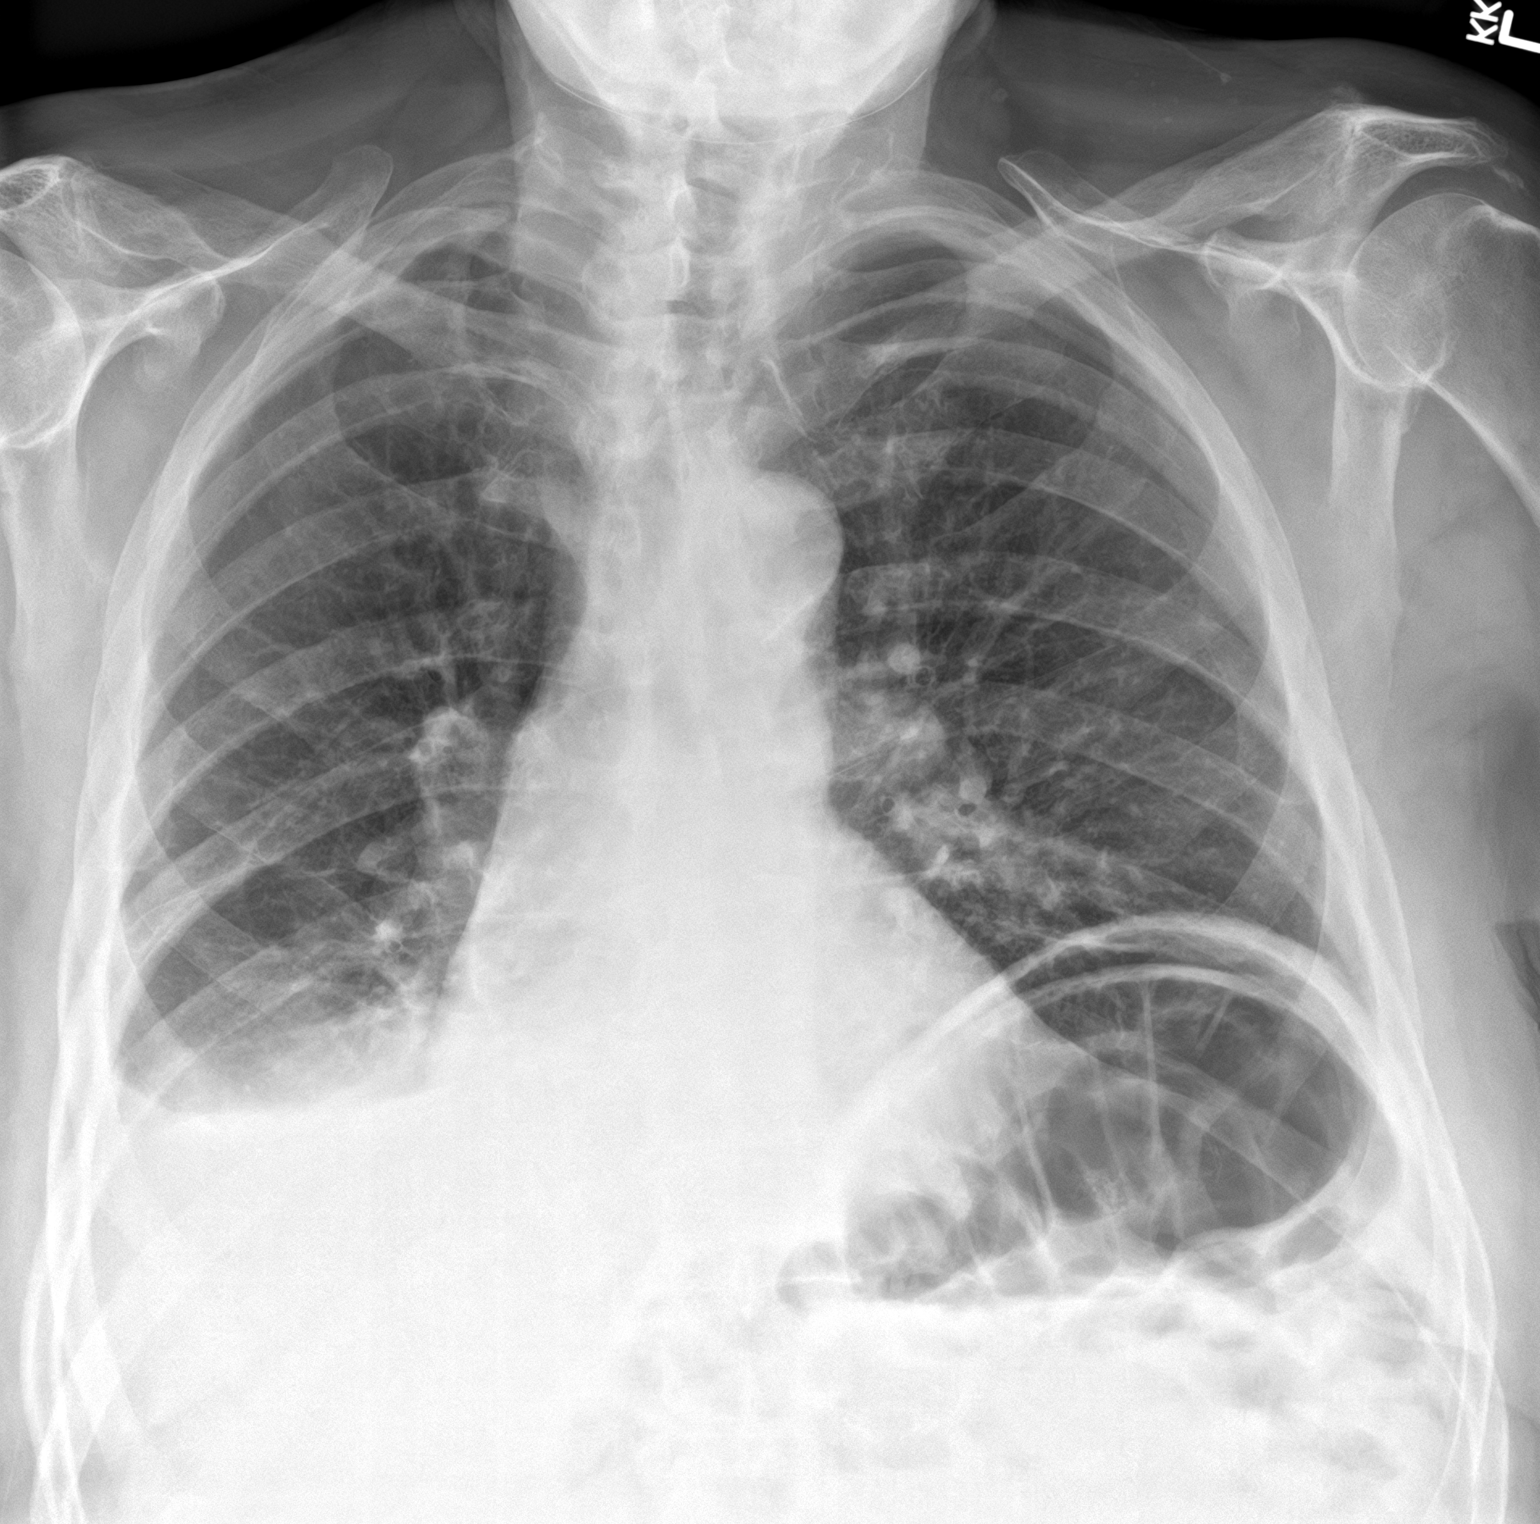

[chest lat]
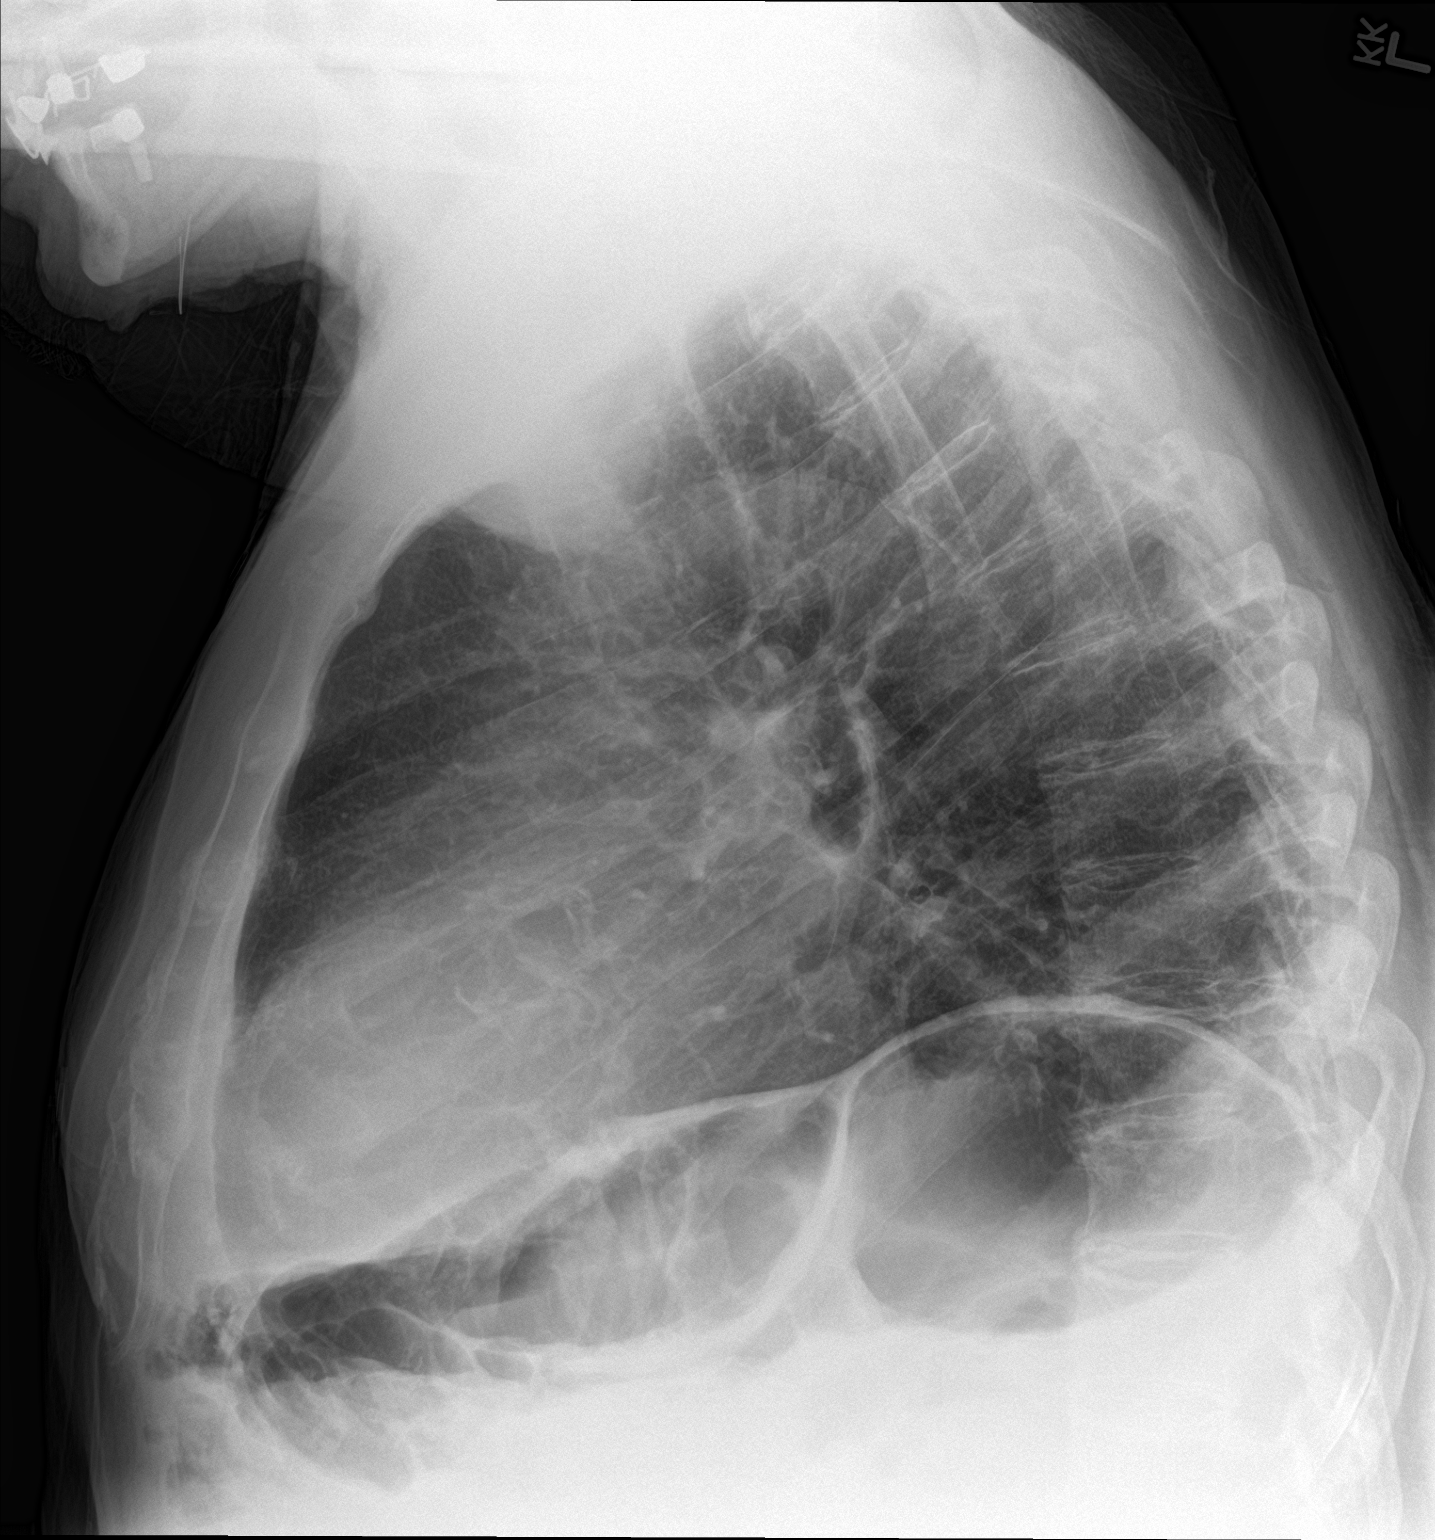

[2 of 2 positions shown; findings below may reference images not displayed]

FINDINGS: Heart size is normal. Persistent small RIGHT pleural effusion
appears similar. There is minimal RIGHT basilar opacity consistent
with atelectasis or infiltrate and also similar compared to prior.
Otherwise the lungs are clear. No pulmonary edema.
IMPRESSION: Persistent RIGHT pleural effusion and RIGHT basilar opacity
consistent with atelectasis or infiltrate.

## 2022-11-19 DEATH — deceased
